# Patient Record
Sex: Male | Born: 1985 | Race: White | Hispanic: No | Marital: Single | Smoking: Current every day smoker
Health system: Southern US, Community
[De-identification: ages and names within clinical notes are randomized; demographics above are authoritative.]

## PROBLEM LIST (undated history)

## (undated) DIAGNOSIS — F1020 Alcohol dependence, uncomplicated: Secondary | ICD-10-CM

## (undated) DIAGNOSIS — E119 Type 2 diabetes mellitus without complications: Secondary | ICD-10-CM

## (undated) DIAGNOSIS — E079 Disorder of thyroid, unspecified: Secondary | ICD-10-CM

## (undated) DIAGNOSIS — E0500 Thyrotoxicosis with diffuse goiter without thyrotoxic crisis or storm: Secondary | ICD-10-CM

## (undated) HISTORY — PX: OTHER SURGICAL HISTORY: SHX169

---

## 2016-02-18 ENCOUNTER — Emergency Department
Admission: EM | Admit: 2016-02-18 | Discharge: 2016-02-18 | Disposition: A | Discharge status: Home / Self Care | Payer: Self-pay | Attending: Emergency Medicine | Admitting: Emergency Medicine

## 2016-02-18 DIAGNOSIS — B958 Unspecified staphylococcus as the cause of diseases classified elsewhere: Secondary | ICD-10-CM | POA: Insufficient documentation

## 2016-02-18 DIAGNOSIS — Z760 Encounter for issue of repeat prescription: Secondary | ICD-10-CM | POA: Insufficient documentation

## 2016-02-18 DIAGNOSIS — H1031 Unspecified acute conjunctivitis, right eye: Secondary | ICD-10-CM | POA: Insufficient documentation

## 2016-02-18 DIAGNOSIS — L089 Local infection of the skin and subcutaneous tissue, unspecified: Secondary | ICD-10-CM | POA: Insufficient documentation

## 2016-02-18 MED ORDER — POLYMYXIN B-TRIMETHOPRIM 10000-0.1 UNIT/ML-% OP SOLN: 2.0000 [drp] | Freq: Four times a day (QID) | OPHTHALMIC | Status: DC

## 2016-02-18 MED ORDER — SULFAMETHOXAZOLE-TRIMETHOPRIM 800-160 MG PO TABS: 1.0000 | ORAL_TABLET | Freq: Two times a day (BID) | ORAL | Status: DC

## 2016-02-18 MED ORDER — LEVOTHYROXINE SODIUM 100 MCG PO TABS: 100.0000 ug | ORAL_TABLET | Freq: Every day | ORAL | Status: DC

## 2016-02-18 MED ORDER — MUPIROCIN CALCIUM 2 % EX CREA: TOPICAL_CREAM | CUTANEOUS | Status: DC

## 2016-02-18 NOTE — ED Provider Notes (Signed)
[facility] Emergency Department Provider Note  ____________________________________________  Time seen: Approximately 3:07 PM  I have reviewed the triage vital signs and the nursing notes.   HISTORY  Chief Complaint Rash    HPI Joshua Medina is a 30 y.o. male who presents to emergency department complaining of right eye redness, irritation, purulent drainage as well as a "rash" to the left chest wall, left medial arm, and right buttocks. Patient reports that I complaint began 2 days ago. He reports conjunctival erythema, purulent drainage, and a scratchy feeling. Patient has not had any medications for this complaint prior to arrival. He denies any visual changes or known contact with persons with pinkeye. Patient also reports having a "rash" 2 weeks to the left lateral chest wall and left medial arm. Patient also reports that this rash has now spread to his right buttocks. He reports that the rash is painful and "raw." Patient tried some topical medication over-the-counter for ringworm with no relief of symptoms. No fevers or chills, headache,visual changes, chest pain, shortness breath, abdominal pain, nausea or vomiting.  Patient is also requesting a refill of his Synthroid. Patient states that he recently lost insurance coverage and is unable to follow up with his primary care for refill of medication at this time. He is requesting refill of medication until he can establish with another clinic.   No past medical history on file.  There are no active problems to display for this patient.   No past surgical history on file.  Current Outpatient Rx  Name  Route  Sig  Dispense  Refill  . levothyroxine (SYNTHROID) 100 MCG tablet   Oral   Take 1 tablet (100 mcg total) by mouth daily before breakfast.   30 tablet   1   . mupirocin cream (BACTROBAN) 2 %      Apply to affected area 3 times daily   30 g   0   . sulfamethoxazole-trimethoprim (BACTRIM  DS,SEPTRA DS) 800-160 MG tablet   Oral   Take 1 tablet by mouth 2 (two) times daily.   14 tablet   0   . trimethoprim-polymyxin b (POLYTRIM) ophthalmic solution   Right Eye   Place 2 drops into the right eye every 6 (six) hours.   10 mL   0     Allergies Review of patient's allergies indicates no known allergies.  No family history on file.  Social History Social History  Substance Use Topics  . Smoking status: Not on file  . Smokeless tobacco: Not on file  . Alcohol Use: Not on file     Review of Systems  Constitutional: No fever/chills Eyes: No visual changes. Positive congestion for conjunctival erythema. Positive for purulent drainage. ENT: No upper respiratory complaints. Cardiovascular: no chest pain. Respiratory: no cough. No SOB. Gastrointestinal: No abdominal pain.  No nausea, no vomiting.  No diarrhea.  No constipation. Musculoskeletal: Negative for musculoskeletal pain. Skin: Negative for rash, abrasions, lacerations, ecchymosis. Neurological: Negative for headaches, focal weakness or numbness. 10-point ROS otherwise negative.  ____________________________________________   PHYSICAL EXAM:  VITAL SIGNS: ED Triage Vitals  Enc Vitals Group     BP 02/18/16 1405 139/81 mmHg     Pulse Rate 02/18/16 1405 75     Resp 02/18/16 1405 16     Temp 02/18/16 1405 98.2 F (36.8 C)     Temp Source 02/18/16 1405 Oral     SpO2 02/18/16 1405 99 %     Weight 02/18/16 1405  165 lb (74.844 kg)     Height 02/18/16 1405  (1.778 m)     Head Cir --      Peak Flow --      Pain Score --      Pain Loc --      Pain Edu? --      Excl. in GC? --      Constitutional: Alert and oriented. Well appearing and in no acute distress. Eyes: Right-sided conjunctivitis erythematous. Purulent drainage noted to upper and lower eyelashes. Funduscopic exam is reassuring.Marland Kitchen PERRL. EOMI. Head: Atraumatic. ENT:      Ears:       Nose: No congestion/rhinnorhea.      Mouth/Throat:  Mucous membranes are moist.  Neck: No stridor.   Hematological/Lymphatic/Immunilogical: No cervical lymphadenopathy. Cardiovascular: Normal rate, regular rhythm. Normal S1 and S2.  Good peripheral circulation. Respiratory: Normal respiratory effort without tachypnea or retractions. Lungs CTAB. Good air entry to the bases with no decreased or absent breath sounds. Musculoskeletal: Full range of motion to all extremities. No gross deformities appreciated. Neurologic:  Normal speech and language. No gross focal neurologic deficits are appreciated.  Skin:  Skin is warm, dry and intact. Erythematous skin lesions noted to the lateral chest wall, medial left upper arm, and right buttocks. This is tender to palpation. Area has an abrasive appearance. No drainage. No fluctuance. Lesion on lateral chest wall is largest in nature and is approximately 5 cm in length and 2 cm in width. Psychiatric: Mood and affect are normal. Speech and behavior are normal. Patient exhibits appropriate insight and judgement.   ____________________________________________   LABS (all labs ordered are listed, but only abnormal results are displayed)  Labs Reviewed - No data to display ____________________________________________  EKG   ____________________________________________  RADIOLOGY   No results found.  ____________________________________________    PROCEDURES  Procedure(s) performed:       Medications - No data to display   ____________________________________________   INITIAL IMPRESSION / ASSESSMENT AND PLAN / ED COURSE  Pertinent labs & imaging results that were available during my care of the patient were reviewed by me and considered in my medical decision making (see chart for details).  Patient's diagnosis is consistent with mitral conjunctivitis, staph skin infection, and medication refill. Exam is reassuring with no indication for imaging or labs. Patient will be discharged  home with prescriptions for antibiotic eyedrops, oral antibiotics, topical antibiotic, and refill of his symptoms with. Patient is to follow up with primary care provider as needed or otherwise directed. Patient is given ED precautions to return to the ED for any worsening or new symptoms.     ____________________________________________  FINAL CLINICAL IMPRESSION(S) / ED DIAGNOSES  Final diagnoses:  Acute bacterial conjunctivitis of right eye  Staph skin infection  Medication refill      NEW MEDICATIONS STARTED DURING THIS VISIT:  Discharge Medication List as of 02/18/2016  3:10 PM    START taking these medications   Details  levothyroxine (SYNTHROID) 100 MCG tablet Take 1 tablet (100 mcg total) by mouth daily before breakfast., Starting 02/18/2016, Until Discontinued, Print    mupirocin cream (BACTROBAN) 2 % Apply to affected area 3 times daily, Print    sulfamethoxazole-trimethoprim (BACTRIM DS,SEPTRA DS) 800-160 MG tablet Take 1 tablet by mouth 2 (two) times daily., Starting 02/18/2016, Until Discontinued, Print    trimethoprim-polymyxin b (POLYTRIM) ophthalmic solution Place 2 drops into the right eye every 6 (six) hours., Starting 02/18/2016, Until Discontinued, Print  This chart was dictated using voice recognition software/Dragon. Despite best efforts to proofread, errors can occur which can change the meaning. Any change was purely unintentional.    Racheal PatchesJonathan D Staphanie Harbison, PA-C 02/18/16 1530  Phineas SemenGraydon Goodman, MD 02/18/16 [phone]

## 2016-02-18 NOTE — Discharge Instructions (Signed)
Bacterial Conjunctivitis Bacterial conjunctivitis, commonly called pink eye, is an inflammation of the clear membrane that covers the white part of the eye (conjunctiva). The inflammation can also happen on the underside of the eyelids. The blood vessels in the conjunctiva become inflamed, causing the eye to become red or pink. Bacterial conjunctivitis may spread easily from one eye to another and from person to person (contagious).  CAUSES  Bacterial conjunctivitis is caused by bacteria. The bacteria may come from your own skin, your upper respiratory tract, or from someone else with bacterial conjunctivitis. SYMPTOMS  The normally white color of the eye or the underside of the eyelid is usually pink or red. The pink eye is usually associated with irritation, tearing, and some sensitivity to light. Bacterial conjunctivitis is often associated with a thick, yellowish discharge from the eye. The discharge may turn into a crust on the eyelids overnight, which causes your eyelids to stick together. If a discharge is present, there may also be some blurred vision in the affected eye. DIAGNOSIS  Bacterial conjunctivitis is diagnosed by your caregiver through an eye exam and the symptoms that you report. Your caregiver looks for changes in the surface tissues of your eyes, which may point to the specific type of conjunctivitis. A sample of any discharge may be collected on a cotton-tip swab if you have a severe case of conjunctivitis, if your cornea is affected, or if you keep getting repeat infections that do not respond to treatment. The sample will be sent to a lab to see if the inflammation is caused by a bacterial infection and to see if the infection will respond to antibiotic medicines. TREATMENT   Bacterial conjunctivitis is treated with antibiotics. Antibiotic eyedrops are most often used. However, antibiotic ointments are also available. Antibiotics pills are sometimes used. Artificial tears or eye  washes may ease discomfort. HOME CARE INSTRUCTIONS   To ease discomfort, apply a cool, clean washcloth to your eye for 10-20 minutes, 3-4 times a day.  Gently wipe away any drainage from your eye with a warm, wet washcloth or a cotton ball.  Wash your hands often with soap and water. Use paper towels to dry your hands.  Do not share towels or washcloths. This may spread the infection.  Change or wash your pillowcase every day.  You should not use eye makeup until the infection is gone.  Do not operate machinery or drive if your vision is blurred.  Stop using contact lenses. Ask your caregiver how to sterilize or replace your contacts before using them again. This depends on the type of contact lenses that you use.  When applying medicine to the infected eye, do not touch the edge of your eyelid with the eyedrop bottle or ointment tube. SEEK IMMEDIATE MEDICAL CARE IF:   Your infection has not improved within 3 days after beginning treatment.  You had yellow discharge from your eye and it returns.  You have increased eye pain.  Your eye redness is spreading.  Your vision becomes blurred.  You have a fever or persistent symptoms for more than 2-3 days.  You have a fever and your symptoms suddenly get worse.  You have facial pain, redness, or swelling. MAKE SURE YOU:   Understand these instructions.  Will watch your condition.  Will get help right away if you are not doing well or get worse.   This information is not intended to replace advice given to you by your health care provider. Make sure you   discuss any questions you have with your health care provider.   Document Released: 08/07/2005 Document Revised: 08/28/2014 Document Reviewed: 01/08/2012 Elsevier Interactive Patient Education 2016 Elsevier Inc.  Staphylococcal Infection WHAT IS A STAPHYLOCOCCAL INFECTION? Staphylococcus aureus, also known as staph, is a bacteria that can cause infections. The most common  type of staph infection is a skin infection. Staph can spread easily from one person to another. HOW IS A STAPHYLOCOCCAL INFECTION DIAGNOSED? Your health care provider may diagnose and treat a staph infection based on an exam. He or she may also do a culture from the affected area:  To find out exactly what type of bacteria is causing your infection.  To decide which medicine is best to treat it. HOW IS A STAPHYLOCOCCAL INFECTION TREATED? Most staph infections can be easily treated with antibiotic medicine. Some infections must be drained. More serious types of staph, including methicillin-resistant Staphylococcus aureus (MRSA) may be more difficult to treat. They may require a strong antibiotic or more than one antibiotic. WHAT SHOULD I DO AT HOME?  Take your antibiotic as directed by your health care provider. Finish your antibiotic even if you start to feel better.  Keep all follow-up visits as directed by your health care provider.  Tell all of your health care providers including dentists that you have a staph infection, especially if you have been diagnosed with MRSA.  Ask your health care provider when it is safe for you to return to school or work.  To prevent spreading the infection:  Keep infections and pus or drainage material covered with clean, dry bandages (dressings), or as directed by your health care provider.  Wash your hands with soap and water often, especially after changing dressings or touching your infection.  Advise your family and other close contacts to wash their hands frequently with soap and water. They should always wash their hands after changing your dressings, touching the infected area, or touching infected materials.  Avoid sharing personal items that may have had contact with your infection. These include towels, washcloths, razors, clothing, and uniforms.  Wash your linens and clothes with hot water and laundry detergent. Drying clothes in a hot  dryer--rather than air-drying--also helps to kill bacteria in clothes. WHEN SHOULD I CONTACT MY HEALTH CARE PROVIDER? Although they are usually easy to treat, some staph infections can become very serious. You should contact your health care provider if your infection gets worse or if you have a fever.   This information is not intended to replace advice given to you by your health care provider. Make sure you discuss any questions you have with your health care provider.   Document Released: 08/28/2014 Document Reviewed: 08/28/2014 Elsevier Interactive Patient Education 2016 Elsevier Inc.  Antibiotic Medicine Antibiotic medicines are used to treat infections caused by bacteria. They work by injuring or killing the bacteria that is making you sick. HOW IS AN ANTIBIOTIC CHOSEN? An antibiotic is chosen based on many factors. To help your health care provider choose one for you, tell your health care provider if:  You have any allergies.  You are pregnant or plan to get pregnant.  You are breastfeeding.  You are taking any medicines. These include over-the-counter medicines, prescription medicines, and herbal remedies.  You have a medical condition or problem you have not already discussed. Your health care provider will also consider:  How often the medicine has to be taken.  Common side effects of the medicine.  The cost of the medicine.  The taste of the medicine. If you have questions about why an antibiotic was chosen, make sure to ask. FOR HOW LONG SHOULD I TAKE MY ANTIBIOTIC? Continue to take your antibiotic for as long as told by your health care provider. Do not stop taking it when you feel better. If you stop taking it too soon:  You may start to feel sick again.  Your infection may become harder to treat.  Complications may develop. WHAT IF I MISS A DOSE? Try not to miss any doses of medicine. If you miss a dose, take it as soon as possible. However, if it is almost  time for the next dose:  If you are taking 2 doses per day, take the missed dose and the next dose 5 to 6 hours apart.  If you are taking 3 or more doses per day, take the missed dose and the next dose 2 to 4 hours apart, then go back to the normal schedule. If you cannot make up a missed dose, take the next scheduled dose on time. Then take the missed dose after you have taken all the doses as recommended by your health care provider, as if you had one more dose left. DO ANTIBIOTICS AFFECT BIRTH CONTROL? Birth control pills may not work while you are on antibiotics. If you are taking birth control pills, continue taking them as usual and use a second form of birth control, such as a condom, to avoid unwanted pregnancy. Continue using the second form of birth control until you are finished with your current 1 month cycle of birth control pills. OTHER INFORMATION  If there is any medicine left over, throw it away.  Never take someone else's antibiotics.  Never take leftover antibiotics. SEEK MEDICAL CARE IF:  You get worse.  You do not feel better within a few days of starting the antibiotic medicine.  You vomit.  White patches appear in your mouth.  You have new joint pain that begins after starting the antibiotic.  You have new muscle aches that begin after starting the antibiotic.  You had a fever before starting the antibiotic and it returns.  You have any symptoms of an allergic reaction, such as an itchy rash. If this happens, stop taking the antibiotic. SEEK IMMEDIATE MEDICAL CARE IF:  Your urine turns dark or becomes blood-colored.  Your skin turns yellow.  You bruise or bleed easily.  You have severe diarrhea and abdominal cramps.  You have a severe headache.  You have signs of a severe allergic reaction, such as:  Trouble breathing.  Wheezing.  Swelling of the lips, tongue, or face.  Fainting.  Blisters on the skin or in the mouth. If you have signs of  a severe allergic reaction, stop taking the antibiotic right away.   This information is not intended to replace advice given to you by your health care provider. Make sure you discuss any questions you have with your health care provider.   Document Released: 04/19/2004 Document Revised: 04/28/2015 Document Reviewed: 12/23/2014 Elsevier Interactive Patient Education [employer]2016 Elsevier Inc.

## 2016-02-18 NOTE — ED Notes (Signed)
Pt reports skin eruption on his left upper back, small place on the left upper arm, outer and inner, pt also has redness with drainage to the rt eye

## 2016-02-18 NOTE — ED Notes (Signed)
See triage note  Developed a red raised area under left arm about 2 weeks ago   Now has an area to buttocks and left upper arm

## 2019-04-08 ENCOUNTER — Encounter: Payer: Self-pay | Admitting: Medical Oncology

## 2019-04-08 ENCOUNTER — Emergency Department
Admission: EM | Admit: 2019-04-08 | Discharge: 2019-04-08 | Disposition: A | Discharge status: Home / Self Care | Payer: Self-pay | Attending: Emergency Medicine | Admitting: Emergency Medicine

## 2019-04-08 ENCOUNTER — Other Ambulatory Visit: Payer: Self-pay

## 2019-04-08 DIAGNOSIS — R112 Nausea with vomiting, unspecified: Secondary | ICD-10-CM | POA: Insufficient documentation

## 2019-04-08 HISTORY — DX: Thyrotoxicosis with diffuse goiter without thyrotoxic crisis or storm: E05.00

## 2019-04-08 HISTORY — DX: Disorder of thyroid, unspecified: E07.9

## 2019-04-08 NOTE — ED Triage Notes (Signed)
Pt reports that he began having nausea and vomiting last night- denies pain. Pt reports he thinks he ate some "bad shushi". Pt reports only reason he is here for doctors note from work.

## 2019-04-08 NOTE — ED Notes (Signed)
See triage note  Presents with n/v which started last pm  Last time vomiting was at 6 am  Denies any abd pain or fever thinks he ate some bad sushi  Afebrile on arrival

## 2019-04-08 NOTE — ED Provider Notes (Signed)
[facility] Emergency Department Provider Note  ____________________________________________   First MD Initiated Contact with Patient 04/08/19 1327     (approximate)  I have reviewed the triage vital signs and the nursing notes.   HISTORY  Chief Complaint Emesis    HPI Townsend RogerScott Chock is a 33 y.o. male presents to the ED complaining of nausea vomiting that he had last night.  Thinks he ate some "bad sushi ".  He denies any diarrhea.  He only had 3-4 episodes of vomiting and says none today.  States he needs a work note.  He is also concerned about his Graves' disease he is been on medication for 5 to 6 months.  He does not have a regular doctor at this time.    Past Medical History:  Diagnosis Date  . Graves disease   . Thyroid disease     There are no active problems to display for this patient.     Prior to Admission medications   Not on File    Allergies Patient has no known allergies.  No family history on file.  Social History Social History   Tobacco Use  . Smoking status: Not on file  Substance Use Topics  . Alcohol use: Not on file  . Drug use: Not on file    Review of Systems  Constitutional: No fever/chills Eyes: No visual changes. ENT: No sore throat. Respiratory: Denies cough Gastrointestinal: Positive for resolved vomiting Genitourinary: Negative for dysuria. Musculoskeletal: Negative for back pain. Skin: Negative for rash.    ____________________________________________   PHYSICAL EXAM:  VITAL SIGNS: ED Triage Vitals  Enc Vitals Group     BP 04/08/19 1318 116/84     Pulse Rate 04/08/19 1318 82     Resp 04/08/19 1318 16     Temp 04/08/19 1318 98.9 F (37.2 C)     Temp Source 04/08/19 1318 Oral     SpO2 04/08/19 1318 100 %     Weight 04/08/19 1319 148 lb (67.1 kg)     Height 04/08/19 1319 5\' 10"  (1.778 m)     Head Circumference --      Peak Flow --      Pain Score 04/08/19 1319 0     Pain Loc --       Pain Edu? --      Excl. in GC? --     Constitutional: Alert and oriented. Well appearing and in no acute distress. Eyes: Conjunctivae are normal.  Head: Atraumatic. Nose: No congestion/rhinnorhea. Mouth/Throat: Mucous membranes are moist.   Neck:  supple no lymphadenopathy noted Cardiovascular: Normal rate, regular rhythm. Heart sounds are normal Respiratory: Normal respiratory effort.  No retractions, lungs c t a  Abd: soft nontender bs normal all 4 quad, GU: deferred Musculoskeletal: FROM all extremities, warm and well perfused Neurologic:  Normal speech and language.  Skin:  Skin is warm, dry and intact. No rash noted. Psychiatric: Mood and affect are normal. Speech and behavior are normal.  ____________________________________________   LABS (all labs ordered are listed, but only abnormal results are displayed)  Labs Reviewed - No data to display ____________________________________________   ____________________________________________  RADIOLOGY    ____________________________________________   PROCEDURES  Procedure(s) performed: No  Procedures    ____________________________________________   INITIAL IMPRESSION / ASSESSMENT AND PLAN / ED COURSE  Pertinent labs & imaging results that were available during my care of the patient were reviewed by me and considered in my medical decision making (see chart  for details).   Patient is 33 year old male presents emergency department requesting a work note as he had nausea and vomiting last night due to bad sushi.  Physical exam patient appears nontoxic.  He appears well.  Vitals are normal.  Remainder exam is unremarkable  Explained findings to the patient.  Due to his request for thyroid medication explained to him this need to be monitored and he should [facility] with [facility] or 1 of the clinics such as [facility].  Information was given to the patient and a booklet.  He is to  follow-up with 1 of these clinics.  He states he understands will comply.  Is discharged stable condition.    Jermey Closs was evaluated in Emergency Department on 04/08/2019 for the symptoms described in the history of present illness. He was evaluated in the context of the global COVID-19 pandemic, which necessitated consideration that the patient might be at risk for infection with the SARS-CoV-2 virus that causes COVID-19. Institutional protocols and algorithms that pertain to the evaluation of patients at risk for COVID-19 are in a state of rapid change based on information released by regulatory bodies including the CDC and federal and state organizations. These policies and algorithms were followed during the patient's care in the ED.   As part of my medical decision making, I reviewed the following data within the [place] notes reviewed and incorporated, Old chart reviewed, Notes from prior ED visits and [place] Controlled Substance Database  ____________________________________________   FINAL CLINICAL IMPRESSION(S) / ED DIAGNOSES  Final diagnoses:  Nausea and vomiting in adult patient      NEW MEDICATIONS STARTED DURING THIS VISIT:  New Prescriptions   No medications on file     Note:  This document was prepared using Dragon voice recognition software and may include unintentional dictation errors.    Versie Starks, PA-C 04/08/19 1449    Carrie Mew, MD 04/08/19 2031

## 2019-04-08 NOTE — Discharge Instructions (Signed)
Drink plenty fluids.  Follow-up with either the [facility] or [facility].  Please call for an appointment.

## 2019-06-20 ENCOUNTER — Encounter: Payer: Self-pay | Admitting: Emergency Medicine

## 2019-06-20 ENCOUNTER — Other Ambulatory Visit: Payer: Self-pay

## 2019-06-20 ENCOUNTER — Emergency Department
Admission: EM | Admit: 2019-06-20 | Discharge: 2019-06-20 | Disposition: A | Discharge status: Home / Self Care | Payer: Self-pay | Attending: Emergency Medicine | Admitting: Emergency Medicine

## 2019-06-20 DIAGNOSIS — F17210 Nicotine dependence, cigarettes, uncomplicated: Secondary | ICD-10-CM | POA: Insufficient documentation

## 2019-06-20 DIAGNOSIS — E039 Hypothyroidism, unspecified: Secondary | ICD-10-CM | POA: Insufficient documentation

## 2019-06-20 DIAGNOSIS — F1020 Alcohol dependence, uncomplicated: Secondary | ICD-10-CM

## 2019-06-20 LAB — CBC WITH DIFFERENTIAL/PLATELET
Abs Immature Granulocytes: 0.09 10*3/uL — ABNORMAL HIGH (ref 0.00–0.07)
Basophils Absolute: 0.2 10*3/uL — ABNORMAL HIGH (ref 0.0–0.1)
Basophils Relative: 3 %
Eosinophils Absolute: 0.1 10*3/uL (ref 0.0–0.5)
Eosinophils Relative: 2 %
HCT: 43.7 % (ref 39.0–52.0)
Hemoglobin: 15.1 g/dL (ref 13.0–17.0)
Immature Granulocytes: 1 %
Lymphocytes Relative: 51 %
Lymphs Abs: 3.3 10*3/uL (ref 0.7–4.0)
MCH: 36.1 pg — ABNORMAL HIGH (ref 26.0–34.0)
MCHC: 34.6 g/dL (ref 30.0–36.0)
MCV: 104.5 fL — ABNORMAL HIGH (ref 80.0–100.0)
Monocytes Absolute: 0.5 10*3/uL (ref 0.1–1.0)
Monocytes Relative: 7 %
Neutro Abs: 2.4 10*3/uL (ref 1.7–7.7)
Neutrophils Relative %: 36 %
Platelets: 110 10*3/uL — ABNORMAL LOW (ref 150–400)
RBC: 4.18 MIL/uL — ABNORMAL LOW (ref 4.22–5.81)
RDW: 14.8 % (ref 11.5–15.5)
WBC: 6.7 10*3/uL (ref 4.0–10.5)
nRBC: 0 % (ref 0.0–0.2)

## 2019-06-20 LAB — TROPONIN I (HIGH SENSITIVITY): Troponin I (High Sensitivity): 6 ng/L (ref <18)

## 2019-06-20 LAB — COMPREHENSIVE METABOLIC PANEL
ALT: 78 U/L — ABNORMAL HIGH (ref 0–44)
AST: 323 U/L — ABNORMAL HIGH (ref 15–41)
Albumin: 5 g/dL (ref 3.5–5.0)
Alkaline Phosphatase: 124 U/L (ref 38–126)
Anion gap: 18 — ABNORMAL HIGH (ref 5–15)
BUN: 11 mg/dL (ref 6–20)
CO2: 24 mmol/L (ref 22–32)
Calcium: 9.6 mg/dL (ref 8.9–10.3)
Chloride: 103 mmol/L (ref 98–111)
Creatinine, Ser: 1.04 mg/dL (ref 0.61–1.24)
GFR calc Af Amer: >60 mL/min (ref >60)
GFR calc non Af Amer: >60 mL/min (ref >60)
Glucose, Bld: 97 mg/dL (ref 70–99)
Potassium: 5.5 mmol/L — ABNORMAL HIGH (ref 3.5–5.1)
Sodium: 145 mmol/L (ref 135–145)
Total Bilirubin: 0.9 mg/dL (ref 0.3–1.2)
Total Protein: 8.4 g/dL — ABNORMAL HIGH (ref 6.5–8.1)

## 2019-06-20 LAB — TSH: TSH: 92 u[IU]/mL — ABNORMAL HIGH (ref 0.350–4.500)

## 2019-06-20 LAB — T4, FREE: Free T4: <0.25 ng/dL — ABNORMAL LOW (ref 0.61–1.12)

## 2019-06-20 MED ORDER — LEVOTHYROXINE SODIUM 100 MCG PO TABS: 100.0000 ug | ORAL_TABLET | Freq: Every day | ORAL | 11 refills | Status: DC

## 2019-06-20 MED ORDER — CHLORDIAZEPOXIDE HCL 25 MG PO CAPS: 25.0000 mg | ORAL_CAPSULE | Freq: Four times a day (QID) | ORAL | 0 refills | Status: DC | PRN

## 2019-06-20 MED ORDER — LEVOTHYROXINE SODIUM 50 MCG PO TABS
100.0000 ug | ORAL_TABLET | Freq: Every day | ORAL | Status: DC
  Administered 2019-06-20: 100 ug via ORAL
  Filled 2019-06-20: qty 2

## 2019-06-20 NOTE — ED Provider Notes (Addendum)
[facility] Emergency Department Provider Note       Time seen: ----------------------------------------- 10:01 AM on 06/20/2019 -----------------------------------------   I have reviewed the triage vital signs and the nursing notes.  HISTORY   Chief Complaint Weakness    HPI Joshua Medina is a 33 y.o. male with a history of Graves' disease who presents to the ED for a month of weakness.  Patient reports he has been off his thyroid medications for the last 1 to 2 years.  Patient states he has not taken his thyroid medication because he has run out of his insurance.  He has felt weak, has been had lots of bruising.  He denies fevers, chills or other complaints.  Past Medical History:  Diagnosis Date  . Graves disease   . Thyroid disease     There are no active problems to display for this patient.   History reviewed. No pertinent surgical history.  Allergies Patient has no known allergies.  Social History Social History   Tobacco Use  . Smoking status: Current Every Day Smoker  . Smokeless tobacco: Never Used  Substance Use Topics  . Alcohol use: Not on file  . Drug use: Not on file    Review of Systems Constitutional: Negative for fever. Cardiovascular: Negative for chest pain. Respiratory: Negative for shortness of breath. Gastrointestinal: Negative for abdominal pain, vomiting and diarrhea. Musculoskeletal: Negative for back pain. Skin: Positive for bruising Neurological: Positive for generalized weakness  All systems negative/normal/unremarkable except as stated in the HPI  ____________________________________________   PHYSICAL EXAM:  VITAL SIGNS: ED Triage Vitals  Enc Vitals Group     BP 06/20/19 0706 118/84     Pulse Rate 06/20/19 0706 (!) 102     Resp 06/20/19 0706 16     Temp 06/20/19 0706 97.6 F (36.4 C)     Temp Source 06/20/19 0706 Oral     SpO2 06/20/19 0706 97 %     Weight 06/20/19 0659 150 lb (68 kg)      Height 06/20/19 0659 5\' 10"  (1.778 m)     Head Circumference --      Peak Flow --      Pain Score 06/20/19 0659 0     Pain Loc --      Pain Edu? --      Excl. in [place]? --    Constitutional: Alert and oriented. Well appearing and in no distress. Eyes: Conjunctivae are normal. Normal extraocular movements. Cardiovascular: Normal rate, regular rhythm. No murmurs, rubs, or gallops. Respiratory: Normal respiratory effort without tachypnea nor retractions. Breath sounds are clear and equal bilaterally. No wheezes/rales/rhonchi. Gastrointestinal: Soft and nontender. Normal bowel sounds Musculoskeletal: Nontender with normal range of motion in extremities. No lower extremity tenderness nor edema. Neurologic:  Normal speech and language. No gross focal neurologic deficits are appreciated.  Skin: Scattered bruising is noted Psychiatric: Mood and affect are normal. Speech and behavior are normal.  ____________________________________________  EKG: Interpreted by me.  Sinus rhythm the rate of 88 bpm, rightward axis, normal QT  ____________________________________________  ED COURSE:  As part of my medical decision making, I reviewed the following data within the [place] History obtained from family if available, nursing notes, old chart and ekg, as well as notes from prior ED visits. Patient presented for weakness with known hypothyroidism, we will assess with labs and imaging as indicated at this time.   Procedures  Joshua Medina was evaluated in Emergency Department on 06/20/2019 for  the symptoms described in the history of present illness. He was evaluated in the context of the global COVID-19 pandemic, which necessitated consideration that the patient might be at risk for infection with the SARS-CoV-2 virus that causes COVID-19. Institutional protocols and algorithms that pertain to the evaluation of patients at risk for COVID-19 are in a state of rapid change based on  information released by regulatory bodies including the CDC and federal and state organizations. These policies and algorithms were followed during the patient's care in the ED.  ____________________________________________   LABS (pertinent positives/negatives)  Labs Reviewed  CBC WITH DIFFERENTIAL/PLATELET - Abnormal; Notable for the following components:      Result Value   RBC 4.18 (*)    MCV 104.5 (*)    MCH 36.1 (*)    Platelets 110 (*)    Basophils Absolute 0.2 (*)    Abs Immature Granulocytes 0.09 (*)    All other components within normal limits  COMPREHENSIVE METABOLIC PANEL - Abnormal; Notable for the following components:   Potassium 5.5 (*)    Total Protein 8.4 (*)    AST 323 (*)    ALT 78 (*)    Anion gap 18 (*)    All other components within normal limits  TSH - Abnormal; Notable for the following components:   TSH 92.000 (*)    All other components within normal limits  URINALYSIS, COMPLETE (UACMP) WITH MICROSCOPIC  T4, FREE  TROPONIN I (HIGH SENSITIVITY)   ____________________________________________   DIFFERENTIAL DIAGNOSIS   Hypothyroidism, dehydration, electrolyte abnormality, occult infection  FINAL ASSESSMENT AND PLAN  Hypothyroidism, alcohol use disorder   Plan: The patient had presented for weakness. Patient's labs did indicate significant hypothyroidism.  EKG revealed normal QT.  We restarted his Synthroid here with prescriptions for same at a lower dose than his prior. Patient also suffers from alcoholism, and I have given him some detox referral information. He will be encouraged to have close outpatient follow-up.   Ulice Dash, MD    Note: This note was generated in part or whole with voice recognition software. Voice recognition is usually quite accurate but there are transcription errors that can and very often do occur. I apologize for any typographical errors that were not detected and corrected.     Emily Filbert,  MD 06/20/19 1015    Emily Filbert, MD 06/20/19 [phone]

## 2019-06-20 NOTE — ED Notes (Signed)
Pt ambulatory to lobby in NAD. PApers in hands and pt updated on treatment plan.

## 2019-06-20 NOTE — ED Triage Notes (Signed)
Patient ambulatory to triage with steady gait, without difficulty or distress noted, mask in place; pt reports for last month having weakness ; st has been off his thyroid meds last 1-2 yrs

## 2020-01-29 ENCOUNTER — Encounter: Payer: Self-pay | Admitting: Emergency Medicine

## 2020-01-29 ENCOUNTER — Other Ambulatory Visit: Payer: Self-pay

## 2020-01-29 ENCOUNTER — Emergency Department: Payer: Self-pay

## 2020-01-29 ENCOUNTER — Emergency Department
Admission: EM | Admit: 2020-01-29 | Discharge: 2020-01-30 | Disposition: A | Discharge status: Home / Self Care | Payer: Self-pay | Attending: Emergency Medicine | Admitting: Emergency Medicine

## 2020-01-29 DIAGNOSIS — F10129 Alcohol abuse with intoxication, unspecified: Secondary | ICD-10-CM | POA: Insufficient documentation

## 2020-01-29 DIAGNOSIS — R55 Syncope and collapse: Secondary | ICD-10-CM | POA: Insufficient documentation

## 2020-01-29 DIAGNOSIS — W01198A Fall on same level from slipping, tripping and stumbling with subsequent striking against other object, initial encounter: Secondary | ICD-10-CM | POA: Insufficient documentation

## 2020-01-29 DIAGNOSIS — Y900 Blood alcohol level of less than 20 mg/100 ml: Secondary | ICD-10-CM | POA: Insufficient documentation

## 2020-01-29 DIAGNOSIS — Y999 Unspecified external cause status: Secondary | ICD-10-CM | POA: Insufficient documentation

## 2020-01-29 DIAGNOSIS — E0500 Thyrotoxicosis with diffuse goiter without thyrotoxic crisis or storm: Secondary | ICD-10-CM | POA: Insufficient documentation

## 2020-01-29 DIAGNOSIS — Y9389 Activity, other specified: Secondary | ICD-10-CM | POA: Insufficient documentation

## 2020-01-29 DIAGNOSIS — S0181XA Laceration without foreign body of other part of head, initial encounter: Secondary | ICD-10-CM

## 2020-01-29 DIAGNOSIS — F10929 Alcohol use, unspecified with intoxication, unspecified: Secondary | ICD-10-CM

## 2020-01-29 DIAGNOSIS — E876 Hypokalemia: Secondary | ICD-10-CM | POA: Insufficient documentation

## 2020-01-29 DIAGNOSIS — Y92012 Bathroom of single-family (private) house as the place of occurrence of the external cause: Secondary | ICD-10-CM | POA: Insufficient documentation

## 2020-01-29 DIAGNOSIS — S01112A Laceration without foreign body of left eyelid and periocular area, initial encounter: Secondary | ICD-10-CM | POA: Insufficient documentation

## 2020-01-29 LAB — BASIC METABOLIC PANEL
Anion gap: 11 (ref 5–15)
BUN: <5 mg/dL — ABNORMAL LOW (ref 6–20)
CO2: 23 mmol/L (ref 22–32)
Calcium: 7 mg/dL — ABNORMAL LOW (ref 8.9–10.3)
Chloride: 104 mmol/L (ref 98–111)
Creatinine, Ser: 0.92 mg/dL (ref 0.61–1.24)
GFR calc Af Amer: >60 mL/min (ref >60)
GFR calc non Af Amer: >60 mL/min (ref >60)
Glucose, Bld: 99 mg/dL (ref 70–99)
Potassium: 2.8 mmol/L — ABNORMAL LOW (ref 3.5–5.1)
Sodium: 138 mmol/L (ref 135–145)

## 2020-01-29 LAB — GLUCOSE, CAPILLARY: Glucose-Capillary: 86 mg/dL (ref 70–99)

## 2020-01-29 LAB — CBC
HCT: 31.3 % — ABNORMAL LOW (ref 39.0–52.0)
Hemoglobin: 11.1 g/dL — ABNORMAL LOW (ref 13.0–17.0)
MCH: 35.9 pg — ABNORMAL HIGH (ref 26.0–34.0)
MCHC: 35.5 g/dL (ref 30.0–36.0)
MCV: 101.3 fL — ABNORMAL HIGH (ref 80.0–100.0)
Platelets: 92 10*3/uL — ABNORMAL LOW (ref 150–400)
RBC: 3.09 MIL/uL — ABNORMAL LOW (ref 4.22–5.81)
RDW: 14.8 % (ref 11.5–15.5)
WBC: 6.1 10*3/uL (ref 4.0–10.5)
nRBC: 0 % (ref 0.0–0.2)

## 2020-01-29 LAB — T4, FREE: Free T4: 0.33 ng/dL — ABNORMAL LOW (ref 0.61–1.12)

## 2020-01-29 LAB — TSH: TSH: 40.132 u[IU]/mL — ABNORMAL HIGH (ref 0.350–4.500)

## 2020-01-29 LAB — ETHANOL: Alcohol, Ethyl (B): 323 mg/dL (ref <10)

## 2020-01-29 MED ORDER — SODIUM CHLORIDE 0.9 % IV SOLN: Freq: Once | INTRAVENOUS | Status: AC

## 2020-01-29 MED ORDER — SODIUM CHLORIDE 0.9% FLUSH
3.0000 mL | Freq: Once | INTRAVENOUS | Status: AC
  Administered 2020-01-29: 3 mL via INTRAVENOUS

## 2020-01-29 NOTE — ED Provider Notes (Signed)
-----------------------------------------   11:06 PM on 01/29/2020 -----------------------------------------  Blood pressure 115/90, pulse 64, temperature 97.6 F (36.4 C), temperature source Oral, resp. rate 18, height 5\' 6"  (1.676 m), weight 68 kg, SpO2 100 %.  Assuming care from Dr. .  In short, Joshua Medina is a 34 y.o. male with a chief complaint of Near Syncope .  Refer to the original H&P for additional details.  The current plan of care is to follow-up labs and observe until clinically sober.  ----------------------------------------- 3:31 AM on 01/30/2020 -----------------------------------------  Labs remarkable for hypokalemia and hypothyroidism in addition to elevated blood alcohol.  Patient was given 40 mEq of potassium but refused any additional doses.  He was also given initial dose of levothyroxine, which he was previously taking but has been off for at least 1 year.  He was provided with prescription for levothyroxine and counseled to follow-up with his PCP.  His wife also arrived to the ED to provide a safe ride home.  Patient agrees with plan.    03/31/2020, MD 01/30/20 [phone]

## 2020-01-29 NOTE — ED Provider Notes (Signed)
ER Provider Note       Time seen: 9:47 PM    I have reviewed the vital signs and the nursing notes.  HISTORY   Chief Complaint Near Syncope    HPI Joshua Medina is a 34 y.o. male with a history of Graves' disease who presents today for syncope. Patient describes 2 syncopal episodes at home, alcohol reportedly is on board.  Patient fell and hit left side of his head on the toilet.  Did have loss of consciousness at some point, sustained a laceration near the left eye.  Patient reports left-sided jaw swelling from a tooth abscess that is not causing him pain at the moment.  He denies any recent illness, states he supposed to be on thyroid medication but is not taking it.  Reportedly he was orthostatic in route.  Past Medical History:  Diagnosis Date  . Graves disease   . Thyroid disease     History reviewed. No pertinent surgical history.  Allergies Patient has no known allergies.  Review of Systems Constitutional: Negative for fever. Cardiovascular: Negative for chest pain. Respiratory: Negative for shortness of breath. Gastrointestinal: Negative for abdominal pain, vomiting and diarrhea. Musculoskeletal: Negative for back pain. Skin: Positive for laceration Neurological: Negative for headaches, focal weakness or numbness.  All systems negative/normal/unremarkable except as stated in the HPI  ____________________________________________   PHYSICAL EXAM:  VITAL SIGNS: Vitals:   01/29/20 2142  BP: 115/90  Pulse: 64  Resp: 18  SpO2: 100%    Constitutional: Alert and oriented. Well appearing and in no distress. Eyes: Conjunctivae are normal. Normal extraocular movements. ENT      Head: Normocephalic, 2.5 cm linear laceration in the superior lateral periorbital region on the left      Nose: No congestion/rhinnorhea.      Mouth/Throat: Mucous membranes are moist, left-sided cheek and jaw swelling relative to dental abscess on the left lower jaw      Neck: No  stridor. Cardiovascular: Normal rate, regular rhythm. No murmurs, rubs, or gallops. Respiratory: Normal respiratory effort without tachypnea nor retractions. Breath sounds are clear and equal bilaterally. No wheezes/rales/rhonchi. Gastrointestinal: Soft and nontender. Normal bowel sounds Musculoskeletal: Nontender with normal range of motion in extremities. No lower extremity tenderness nor edema. Neurologic:  Normal speech and language. No gross focal neurologic deficits are appreciated.  Skin: Left periorbital laceration as noted above Psychiatric: Speech and behavior are normal.  ____________________________________________  EKG: Interpreted by me.  Sinus rhythm rate of 63 bpm, normal PR interval, normal QRS, normal QT  ____________________________________________   LABS (pertinent positives/negatives)  Labs Reviewed  CBC - Abnormal; Notable for the following components:      Result Value   RBC 3.09 (*)    Hemoglobin 11.1 (*)    HCT 31.3 (*)    MCV 101.3 (*)    MCH 35.9 (*)    Platelets 92 (*)    All other components within normal limits  ETHANOL - Abnormal; Notable for the following components:   Alcohol, Ethyl (B) 323 (*)    All other components within normal limits  GLUCOSE, CAPILLARY  URINALYSIS, COMPLETE (UACMP) WITH MICROSCOPIC  URINE DRUG SCREEN, QUALITATIVE ([facility] ONLY)  BASIC METABOLIC PANEL  T4, FREE  TSH  CBG MONITORING, ED  CBG MONITORING, ED   .Marland KitchenLaceration Repair  Date/Time: 01/29/2020 9:51 PM Performed by: Emily Filbert, MD Authorized by: Emily Filbert, MD   Consent:    Consent obtained:  Verbal   Consent given by:  Patient  Anesthesia (see MAR for exact dosages):    Anesthesia method:  None Laceration details:    Location:  Face   Face location:  L eyebrow   Length (cm):  2.5   Depth (mm):  3 Repair type:    Repair type:  Simple Treatment:    Amount of cleaning:  Standard   Irrigation solution:  Sterile saline Skin repair:     Repair method:  Tissue adhesive Approximation:    Approximation:  Close Post-procedure details:    Dressing:  Open (no dressing)   Patient tolerance of procedure:  Tolerated well, no immediate complications     RADIOLOGY  Images were viewed by me CT head was refused by the patient  DIFFERENTIAL DIAGNOSIS  Syncope, intoxication, dehydration, electrolyte abnormality, arrhythmia, subdural, tooth abscess, laceration  ASSESSMENT AND PLAN  Syncope, facial laceration, alcohol intoxication   Plan: The patient had presented for a syncopal event.  Patient was reportedly significantly orthostatic in the prehospital setting.  He has received IV fluids here.  Wound was repaired as dictated above.  Patient's labs indicated likely chronic alcoholism and severe alcohol intoxication.   Lenise Arena MD    Note: This note was generated in part or whole with voice recognition software. Voice recognition is usually quite accurate but there are transcription errors that can and very often do occur. I apologize for any typographical errors that were not detected and corrected.     Earleen Newport, MD 01/29/20 2249

## 2020-01-29 NOTE — ED Notes (Signed)
This RN attended to pt's call bell.  Pt st "I don't want the CT scan". This RN notified EDP Williams.

## 2020-01-29 NOTE — ED Notes (Signed)
Pt provided with phone to call spouse.

## 2020-01-29 NOTE — ED Triage Notes (Addendum)
Pt from home via [facility]. Per EMS, pt's spouse called [facility]. Pt had 2 syncopal episodes at home, st drinking 'bodka" tonight.  Pt st falling "on the way to the bathroom and hit the toilet". LOC - laceration noted on left upper eyebrow.   EDP Williams at bedside.

## 2020-01-30 MED ORDER — LEVOTHYROXINE SODIUM 100 MCG PO TABS
100.0000 ug | ORAL_TABLET | Freq: Every day | ORAL | 1 refills | Status: DC
Start: 2020-01-30 — End: 2020-02-20

## 2020-01-30 MED ORDER — LEVOTHYROXINE SODIUM 50 MCG PO TABS
100.0000 ug | ORAL_TABLET | Freq: Once | ORAL | Status: AC
  Administered 2020-01-30: 100 ug via ORAL
  Filled 2020-01-30: qty 2

## 2020-01-30 MED ORDER — POTASSIUM CHLORIDE CRYS ER 20 MEQ PO TBCR
40.0000 meq | EXTENDED_RELEASE_TABLET | Freq: Once | ORAL | Status: AC
  Administered 2020-01-30: 40 meq via ORAL
  Filled 2020-01-30: qty 2

## 2020-01-30 MED ORDER — POTASSIUM CHLORIDE CRYS ER 20 MEQ PO TBCR: 40.0000 meq | EXTENDED_RELEASE_TABLET | Freq: Once | ORAL | Status: DC

## 2020-01-30 NOTE — ED Notes (Addendum)
Pt has removed his cardiac monitoring and IV and s requesting to be d/c at this time. MD notified

## 2020-01-30 NOTE — ED Notes (Signed)
Pt received discharge instructions and has no further questions.  Patient refused discharge vitals and signature.  Patient ambulated out to wife for ride with this RN with steady gait.

## 2020-01-30 NOTE — ED Notes (Signed)
Per MD, this RN encouraged the pt to stay and wait for his 2nd dose of potassium. Pt refused to stay or accept 2nd dose of potassium stating, "ill just take some when I get home". MD notified

## 2020-02-15 ENCOUNTER — Other Ambulatory Visit: Payer: Self-pay

## 2020-02-15 ENCOUNTER — Emergency Department: Payer: Self-pay

## 2020-02-15 ENCOUNTER — Inpatient Hospital Stay
Admission: AD | Admit: 2020-02-15 | Discharge: 2020-02-20 | DRG: 897 | Disposition: A | Discharge status: Home Health | Payer: Self-pay | Attending: Internal Medicine | Admitting: Internal Medicine

## 2020-02-15 ENCOUNTER — Encounter: Payer: Self-pay | Admitting: Emergency Medicine

## 2020-02-15 DIAGNOSIS — D689 Coagulation defect, unspecified: Secondary | ICD-10-CM | POA: Diagnosis present

## 2020-02-15 DIAGNOSIS — D6959 Other secondary thrombocytopenia: Secondary | ICD-10-CM | POA: Diagnosis present

## 2020-02-15 DIAGNOSIS — R4182 Altered mental status, unspecified: Secondary | ICD-10-CM

## 2020-02-15 DIAGNOSIS — F10231 Alcohol dependence with withdrawal delirium: Principal | ICD-10-CM | POA: Diagnosis present

## 2020-02-15 DIAGNOSIS — E0500 Thyrotoxicosis with diffuse goiter without thyrotoxic crisis or storm: Secondary | ICD-10-CM | POA: Diagnosis present

## 2020-02-15 DIAGNOSIS — D649 Anemia, unspecified: Secondary | ICD-10-CM

## 2020-02-15 DIAGNOSIS — E876 Hypokalemia: Secondary | ICD-10-CM | POA: Diagnosis present

## 2020-02-15 DIAGNOSIS — Z20822 Contact with and (suspected) exposure to covid-19: Secondary | ICD-10-CM | POA: Diagnosis present

## 2020-02-15 DIAGNOSIS — E875 Hyperkalemia: Secondary | ICD-10-CM | POA: Diagnosis not present

## 2020-02-15 DIAGNOSIS — K709 Alcoholic liver disease, unspecified: Secondary | ICD-10-CM | POA: Diagnosis present

## 2020-02-15 DIAGNOSIS — F10239 Alcohol dependence with withdrawal, unspecified: Secondary | ICD-10-CM

## 2020-02-15 DIAGNOSIS — D696 Thrombocytopenia, unspecified: Secondary | ICD-10-CM | POA: Diagnosis present

## 2020-02-15 DIAGNOSIS — Z9114 Patient's other noncompliance with medication regimen: Secondary | ICD-10-CM

## 2020-02-15 DIAGNOSIS — F10939 Alcohol use, unspecified with withdrawal, unspecified: Secondary | ICD-10-CM

## 2020-02-15 DIAGNOSIS — R569 Unspecified convulsions: Secondary | ICD-10-CM

## 2020-02-15 DIAGNOSIS — E440 Moderate protein-calorie malnutrition: Secondary | ICD-10-CM | POA: Insufficient documentation

## 2020-02-15 DIAGNOSIS — G4089 Other seizures: Secondary | ICD-10-CM | POA: Diagnosis present

## 2020-02-15 DIAGNOSIS — Z79890 Hormone replacement therapy (postmenopausal): Secondary | ICD-10-CM

## 2020-02-15 DIAGNOSIS — F1020 Alcohol dependence, uncomplicated: Secondary | ICD-10-CM | POA: Diagnosis present

## 2020-02-15 DIAGNOSIS — Z681 Body mass index (BMI) 19 or less, adult: Secondary | ICD-10-CM

## 2020-02-15 DIAGNOSIS — F17200 Nicotine dependence, unspecified, uncomplicated: Secondary | ICD-10-CM | POA: Diagnosis present

## 2020-02-15 DIAGNOSIS — K7040 Alcoholic hepatic failure without coma: Secondary | ICD-10-CM | POA: Diagnosis present

## 2020-02-15 DIAGNOSIS — E039 Hypothyroidism, unspecified: Secondary | ICD-10-CM | POA: Diagnosis present

## 2020-02-15 DIAGNOSIS — F10931 Alcohol use, unspecified with withdrawal delirium: Secondary | ICD-10-CM | POA: Diagnosis present

## 2020-02-15 LAB — CBC
HCT: 29.3 % — ABNORMAL LOW (ref 39.0–52.0)
Hemoglobin: 10.6 g/dL — ABNORMAL LOW (ref 13.0–17.0)
MCH: 35.7 pg — ABNORMAL HIGH (ref 26.0–34.0)
MCHC: 36.2 g/dL — ABNORMAL HIGH (ref 30.0–36.0)
MCV: 98.7 fL (ref 80.0–100.0)
Platelets: 51 10*3/uL — ABNORMAL LOW (ref 150–400)
RBC: 2.97 MIL/uL — ABNORMAL LOW (ref 4.22–5.81)
RDW: 15 % (ref 11.5–15.5)
WBC: 6.3 10*3/uL (ref 4.0–10.5)
nRBC: 0 % (ref 0.0–0.2)

## 2020-02-15 LAB — COMPREHENSIVE METABOLIC PANEL
ALT: 54 U/L — ABNORMAL HIGH (ref 0–44)
AST: 223 U/L — ABNORMAL HIGH (ref 15–41)
Albumin: 3.5 g/dL (ref 3.5–5.0)
Alkaline Phosphatase: 241 U/L — ABNORMAL HIGH (ref 38–126)
Anion gap: 16 — ABNORMAL HIGH (ref 5–15)
BUN: 8 mg/dL (ref 6–20)
CO2: 26 mmol/L (ref 22–32)
Calcium: 7.8 mg/dL — ABNORMAL LOW (ref 8.9–10.3)
Chloride: 93 mmol/L — ABNORMAL LOW (ref 98–111)
Creatinine, Ser: 0.89 mg/dL (ref 0.61–1.24)
GFR calc Af Amer: >60 mL/min (ref >60)
GFR calc non Af Amer: >60 mL/min (ref >60)
Glucose, Bld: 114 mg/dL — ABNORMAL HIGH (ref 70–99)
Potassium: 2.8 mmol/L — ABNORMAL LOW (ref 3.5–5.1)
Sodium: 135 mmol/L (ref 135–145)
Total Bilirubin: 4.7 mg/dL — ABNORMAL HIGH (ref 0.3–1.2)
Total Protein: 6.6 g/dL (ref 6.5–8.1)

## 2020-02-15 LAB — FOLATE: Folate: 5.8 ng/mL — ABNORMAL LOW (ref >5.9)

## 2020-02-15 LAB — IRON AND TIBC
Iron: 257 ug/dL — ABNORMAL HIGH (ref 45–182)
Saturation Ratios: 90 % — ABNORMAL HIGH (ref 17.9–39.5)
TIBC: 284 ug/dL (ref 250–450)
UIBC: 27 ug/dL

## 2020-02-15 LAB — AMMONIA: Ammonia: 34 umol/L (ref 9–35)

## 2020-02-15 LAB — T4, FREE: Free T4: 0.92 ng/dL (ref 0.61–1.12)

## 2020-02-15 LAB — MAGNESIUM: Magnesium: 0.8 mg/dL — CL (ref 1.7–2.4)

## 2020-02-15 LAB — PROTIME-INR
INR: 1.3 — ABNORMAL HIGH (ref 0.8–1.2)
Prothrombin Time: 15.7 seconds — ABNORMAL HIGH (ref 11.4–15.2)

## 2020-02-15 LAB — FERRITIN: Ferritin: 2956 ng/mL — ABNORMAL HIGH (ref 24–336)

## 2020-02-15 LAB — PHOSPHORUS: Phosphorus: 2 mg/dL — ABNORMAL LOW (ref 2.5–4.6)

## 2020-02-15 LAB — TSH: TSH: 14.349 u[IU]/mL — ABNORMAL HIGH (ref 0.350–4.500)

## 2020-02-15 LAB — ETHANOL: Alcohol, Ethyl (B): <10 mg/dL (ref <10)

## 2020-02-15 MED ORDER — THIAMINE HCL 100 MG PO TABS
100.0000 mg | ORAL_TABLET | Freq: Every day | ORAL | Status: DC
  Administered 2020-02-15: 100 mg via ORAL
  Filled 2020-02-15: qty 1

## 2020-02-15 MED ORDER — POTASSIUM CHLORIDE CRYS ER 20 MEQ PO TBCR: 40.0000 meq | EXTENDED_RELEASE_TABLET | Freq: Once | ORAL | Status: DC

## 2020-02-15 MED ORDER — MAGNESIUM SULFATE 2 GM/50ML IV SOLN
2.0000 g | Freq: Once | INTRAVENOUS | Status: AC
  Administered 2020-02-15: 2 g via INTRAVENOUS
  Filled 2020-02-15: qty 50

## 2020-02-15 MED ORDER — LORAZEPAM 1 MG PO TABS: 1.0000 mg | ORAL_TABLET | ORAL | Status: DC | PRN

## 2020-02-15 MED ORDER — ONDANSETRON HCL 4 MG/2ML IJ SOLN
4.0000 mg | Freq: Once | INTRAMUSCULAR | Status: AC
  Administered 2020-02-15: 4 mg via INTRAVENOUS
  Filled 2020-02-15: qty 2

## 2020-02-15 MED ORDER — LORAZEPAM 2 MG/ML IJ SOLN
0.0000 mg | Freq: Four times a day (QID) | INTRAMUSCULAR | Status: AC
  Administered 2020-02-16 – 2020-02-17 (×3): 2 mg via INTRAVENOUS
  Filled 2020-02-15 (×3): qty 1

## 2020-02-15 MED ORDER — LORAZEPAM 1 MG PO TABS
1.0000 mg | ORAL_TABLET | ORAL | Status: AC | PRN
  Administered 2020-02-18: 3 mg via ORAL
  Administered 2020-02-18: 2 mg via ORAL
  Filled 2020-02-15: qty 3
  Filled 2020-02-15: qty 2

## 2020-02-15 MED ORDER — ADULT MULTIVITAMIN W/MINERALS CH: 1.0000 | ORAL_TABLET | Freq: Every day | ORAL | Status: DC

## 2020-02-15 MED ORDER — THIAMINE HCL 100 MG/ML IJ SOLN
100.0000 mg | Freq: Every day | INTRAMUSCULAR | Status: DC
  Administered 2020-02-17 – 2020-02-20 (×2): 100 mg via INTRAVENOUS
  Filled 2020-02-15 (×3): qty 2

## 2020-02-15 MED ORDER — LORAZEPAM 2 MG/ML IJ SOLN: 1.0000 mg | INTRAMUSCULAR | Status: DC | PRN

## 2020-02-15 MED ORDER — ONDANSETRON HCL 4 MG PO TABS: 4.0000 mg | ORAL_TABLET | Freq: Four times a day (QID) | ORAL | Status: DC | PRN

## 2020-02-15 MED ORDER — ADULT MULTIVITAMIN W/MINERALS CH
1.0000 | ORAL_TABLET | Freq: Every day | ORAL | Status: DC
  Administered 2020-02-15: 1 via ORAL
  Filled 2020-02-15: qty 1

## 2020-02-15 MED ORDER — SODIUM CHLORIDE 0.9 % IV SOLN: INTRAVENOUS | Status: DC

## 2020-02-15 MED ORDER — LORAZEPAM 2 MG/ML IJ SOLN
0.0000 mg | Freq: Two times a day (BID) | INTRAMUSCULAR | Status: AC
  Administered 2020-02-17: 2 mg via INTRAVENOUS
  Filled 2020-02-15: qty 1

## 2020-02-15 MED ORDER — LORAZEPAM 1 MG PO TABS
1.0000 mg | ORAL_TABLET | Freq: Once | ORAL | Status: AC
  Administered 2020-02-15: 1 mg via ORAL
  Filled 2020-02-15: qty 1

## 2020-02-15 MED ORDER — LORAZEPAM 2 MG/ML IJ SOLN
1.0000 mg | INTRAMUSCULAR | Status: AC | PRN
  Administered 2020-02-15: 1 mg via INTRAVENOUS
  Administered 2020-02-17: 2 mg via INTRAVENOUS
  Filled 2020-02-15 (×2): qty 1

## 2020-02-15 MED ORDER — IBUPROFEN 200 MG PO TABS
400.0000 mg | ORAL_TABLET | Freq: Four times a day (QID) | ORAL | Status: DC | PRN
  Administered 2020-02-20: 400 mg via ORAL
  Filled 2020-02-15 (×3): qty 2
  Filled 2020-02-15: qty 1

## 2020-02-15 MED ORDER — ADULT MULTIVITAMIN W/MINERALS CH
1.0000 | ORAL_TABLET | Freq: Every day | ORAL | Status: DC
  Administered 2020-02-16 – 2020-02-20 (×5): 1 via ORAL
  Filled 2020-02-15 (×5): qty 1

## 2020-02-15 MED ORDER — SODIUM CHLORIDE 0.9 % IV SOLN: Freq: Once | INTRAVENOUS | Status: AC

## 2020-02-15 MED ORDER — THIAMINE HCL 100 MG PO TABS
100.0000 mg | ORAL_TABLET | Freq: Every day | ORAL | Status: DC
  Administered 2020-02-16 – 2020-02-19 (×3): 100 mg via ORAL
  Filled 2020-02-15 (×4): qty 1

## 2020-02-15 MED ORDER — FOLIC ACID 1 MG PO TABS
1.0000 mg | ORAL_TABLET | Freq: Every day | ORAL | Status: DC
  Administered 2020-02-16 – 2020-02-20 (×6): 1 mg via ORAL
  Filled 2020-02-15 (×5): qty 1

## 2020-02-15 MED ORDER — POTASSIUM CHLORIDE 10 MEQ/100ML IV SOLN
10.0000 meq | INTRAVENOUS | Status: AC
  Administered 2020-02-15 – 2020-02-16 (×2): 10 meq via INTRAVENOUS
  Filled 2020-02-15: qty 200

## 2020-02-15 MED ORDER — FOLIC ACID 1 MG PO TABS: 1.0000 mg | ORAL_TABLET | Freq: Every day | ORAL | Status: DC

## 2020-02-15 MED ORDER — ONDANSETRON HCL 4 MG/2ML IJ SOLN: 4.0000 mg | Freq: Four times a day (QID) | INTRAMUSCULAR | Status: DC | PRN

## 2020-02-15 NOTE — ED Notes (Signed)
Pt reports nausea 

## 2020-02-15 NOTE — ED Notes (Signed)
Pt back to bed att, call bell light at bedside

## 2020-02-15 NOTE — ED Notes (Signed)
This RN responds to Admitting MD speaking to pt and offering to get staff assistance, this RN saw pt crouched on floor in front of toilet, call bell is on, pt IV is removed, pt asked what happened and reports trying to get to toilet;   Dr Para March assessing pt; charge Lea called to update, per directions door open and bed alarm on

## 2020-02-15 NOTE — ED Notes (Addendum)
This RN at bedside to draw ethoanol level. Cari, NP and Dr. Mayford Knife at bedside talking to pt. Pt has ripped out his IV. Bleeding controlled. Informing pt how critical he is at this time. Pt refusing to stay.

## 2020-02-15 NOTE — ED Notes (Signed)
Call from wife att, wants update, Joshua Medina, [phone]

## 2020-02-15 NOTE — ED Triage Notes (Signed)
Pt ems from home for AMS. Per ems pt fell, hit head  and seen here x 1 wk ago. Since then he has been tired , sleepy. Today pt had a period of shaking and then becoming unresponsive for a short period of time. Pt gcs 14-15 here.

## 2020-02-15 NOTE — ED Provider Notes (Addendum)
ER Provider Note       Time seen: 6:53 PM    I have reviewed the vital signs and the nursing notes.  HISTORY   Chief Complaint Altered Mental Status Level V caveat: History/ROS limited by altered mental status   HPI Joshua Medina is a 34 y.o. male with a history of Graves' disease, thyroid disease who presents today for altered mental status.  According to EMS he fell and hit his head was seen here a week ago.  Since then has been tired and sleepy.  Reportedly he had a period of shaking and then became unresponsive for short period of time, he has had intermittent confusion.  Past Medical History:  Diagnosis Date  . Graves disease   . Thyroid disease     History reviewed. No pertinent surgical history.  Allergies Patient has no known allergies.  Review of Systems Constitutional: Negative for fever. Cardiovascular: Negative for chest pain. Respiratory: Negative for shortness of breath. Gastrointestinal: Negative for abdominal pain, vomiting and diarrhea. Musculoskeletal: Negative for back pain. Skin: Negative for rash. Neurological: Positive for confusion  All systems negative/normal/unremarkable except as stated in the HPI  ____________________________________________   PHYSICAL EXAM:  VITAL SIGNS: Vitals:   02/15/20 1746  BP: 117/81  Pulse: (!) 103  Resp: 16  Temp: 98.6 F (37 C)  SpO2: 99%    Constitutional: Chronically ill-appearing, mild distress Eyes: Mildly icteric ENT      Head: Normocephalic, some bruising is noted around the left eye      Nose: No congestion/rhinnorhea.      Mouth/Throat: Mucous membranes are moist.      Neck: No stridor. Cardiovascular: Normal rate, regular rhythm. No murmurs, rubs, or gallops. Respiratory: Normal respiratory effort without tachypnea nor retractions. Breath sounds are clear and equal bilaterally. No wheezes/rales/rhonchi. Gastrointestinal: Soft and nontender. Normal bowel sounds Musculoskeletal: Nontender  with normal range of motion in extremities. No lower extremity tenderness nor edema. Neurologic:  Normal speech and language. No gross focal neurologic deficits are appreciated.  Skin:  Skin is warm, bruising is noted with jaundice Psychiatric: Speech and behavior are normal.  ____________________________________________  EKG: Interpreted by me.  Sinus tachycardia the rate of 102 bpm, borderline right axis deviation, low voltage, normal QT  ____________________________________________   LABS (pertinent positives/negatives)  Labs Reviewed  COMPREHENSIVE METABOLIC PANEL - Abnormal; Notable for the following components:      Result Value   Potassium 2.8 (*)    Chloride 93 (*)    Glucose, Bld 114 (*)    Calcium 7.8 (*)    AST 223 (*)    ALT 54 (*)    Alkaline Phosphatase 241 (*)    Total Bilirubin 4.7 (*)    Anion gap 16 (*)    All other components within normal limits  CBC - Abnormal; Notable for the following components:   RBC 2.97 (*)    Hemoglobin 10.6 (*)    HCT 29.3 (*)    MCH 35.7 (*)    MCHC 36.2 (*)    Platelets 51 (*)    All other components within normal limits  PROTIME-INR - Abnormal; Notable for the following components:   Prothrombin Time 15.7 (*)    INR 1.3 (*)    All other components within normal limits  ETHANOL  TSH  T4, FREE  CBG MONITORING, ED    RADIOLOGY  Images were viewed by me CT head IMPRESSION: Normal head CT.  DIFFERENTIAL DIAGNOSIS  Alcohol withdrawal seizure, DTs, dehydration,  electrolyte abnormality, liver failure, coagulopathy  ASSESSMENT AND PLAN  Alcohol withdrawal seizure, severe alcohol use disorder   Plan: The patient had presented for likely severe alcohol withdrawal including seizure. Patient's labs revealed numerous abnormalities including hyperbilirubinemia, mild transaminitis, mildly elevated anion gap, and mild hypokalemia.  INR was slightly elevated.  He was given Ativan here, placed on CIWA protocol.  Patient does  seem to be intermittently confused and I'm uncomfortable with him going home.  I will IVC him and admit to medicine.  Lenise Arena MD    Note: This note was generated in part or whole with voice recognition software. Voice recognition is usually quite accurate but there are transcription errors that can and very often do occur. I apologize for any typographical errors that were not detected and corrected.     Earleen Newport, MD 02/15/20 Sandrea Hughs    Earleen Newport, MD 02/15/20 2050

## 2020-02-15 NOTE — ED Notes (Signed)
Safety sitter, Morrie Sheldon, EDT, at bedside

## 2020-02-15 NOTE — ED Notes (Signed)
Pt refused to go to head CT

## 2020-02-15 NOTE — H&P (Signed)
History and Physical    Joshua Medina ZOX:[id number] DOB: 1986/07/12 DOA: 02/15/2020  PCP: Patient, No Pcp Per   Patient coming from: Home  I have personally briefly reviewed patient's old medical records in [place]  Chief Complaint: Altered mental status, episode of shaking  HPI: Joshua Medina is a 34 y.o. male with medical history significant for alcohol use disorder currently trying to cut back, hypothyroidism, who was brought in by EMS with a several day history of lethargy, with a.  To date of jerking followed by a brief period of unresponsiveness.  Patient was seen in the emergency room a week prior after falling and hitting his head.  Since arrival to the emergency room has been a bit uncooperative, ripping out his IV and refusing to stay. ED Course: Work-up in the ER significant for abnormal LFTs with AST to ALT ratio 223: 54, bilirubin 4.7.  Had hypokalemia of 2.8.  Thrombocytopenia of 51,000 and anemia of 10.6.  Mild coagulopathy with INR 1.3.  EtOH level less than 10 CT head with no acute intracranial findings.  EKG showed sinus tachycardia at 102.  Pending labs at time of admission include magnesium, phosphorus and TSH.  Patient was given an IV fluid bolus as well as Ativan.  He was IVC'd for admission as he was not considered a safe discharge.  Review of Systems: History limited as patient is very confused  Past Medical History:  Diagnosis Date  . Graves disease   . Thyroid disease     History reviewed. No pertinent surgical history.   reports that he has been smoking. He has never used smokeless tobacco. He reports current alcohol use. No history on file for drug use.  No Known Allergies  Family history review deferred due to altered mental status.  Review of past record shows history of diabetes in maternal grandmother with no other pertinent family history   Prior to Admission medications   Medication Sig Start Date End Date Taking? Authorizing Provider    chlordiazePOXIDE (LIBRIUM) 25 MG capsule Take 1 capsule (25 mg total) by mouth 4 (four) times daily as needed. 06/20/19   Earleen Newport, MD  levothyroxine (SYNTHROID) 100 MCG tablet Take 1 tablet (100 mcg total) by mouth daily. 01/30/20 03/30/20  Blake Divine, MD    Physical Exam: Vitals:   02/15/20 1844 02/15/20 1900 02/15/20 2034 02/15/20 2100  BP: (!) 127/91 113/80  114/90  Pulse:   (!) 101 98  Resp:    18  Temp:      TempSrc:      SpO2:    100%  Weight:      Height:         Vitals:   02/15/20 1844 02/15/20 1900 02/15/20 2034 02/15/20 2100  BP: (!) 127/91 113/80  114/90  Pulse:   (!) 101 98  Resp:    18  Temp:      TempSrc:      SpO2:    100%  Weight:      Height:          Constitutional:  Somewhat ill looking, disoriented, oriented to person only. HEENT:      Head: Normocephalic and atraumatic.         Eyes: PERLA, EOMI, Conjunctivae are normal. Sclera is non-icteric.       Mouth/Throat: Mucous membranes are moist.       Neck: Supple with no signs of meningismus. Cardiovascular: Regular rate and rhythm. No murmurs, gallops, or rubs.  2+ symmetrical distal pulses are present . No JVD. No LE edema Respiratory: Respiratory effort normal .Lungs sounds clear bilaterally. No wheezes, crackles, or rhonchi.  Gastrointestinal: Soft, non tender, and non distended with positive bowel sounds. No rebound or guarding. Genitourinary: No CVA tenderness. Musculoskeletal: Nontender with normal range of motion in all extremities. No cyanosis, or erythema of extremities. Neurologic: Normal speech and language. Face is symmetric. Moving all extremities. No gross focal neurologic deficits . Skin: Skin is warm, dry.  No rash or ulcers Psychiatric: Unable to assess as patient very disoriented.  Does appear restless  Labs on Admission: I have personally reviewed following labs and imaging studies  CBC: Recent Labs  Lab 02/15/20 1750  WBC 6.3  HGB 10.6*  HCT 29.3*  MCV 98.7   PLT 51*   Basic Metabolic Panel: Recent Labs  Lab 02/15/20 1750  NA 135  K 2.8*  CL 93*  CO2 26  GLUCOSE 114*  BUN 8  CREATININE 0.89  CALCIUM 7.8*   GFR: Estimated Creatinine Clearance: 110.4 mL/min (by C-G formula based on SCr of 0.89 mg/dL). Liver Function Tests: Recent Labs  Lab 02/15/20 1750  AST 223*  ALT 54*  ALKPHOS 241*  BILITOT 4.7*  PROT 6.6  ALBUMIN 3.5   No results for input(s): LIPASE, AMYLASE in the last 168 hours. Recent Labs  Lab 02/15/20 2040  AMMONIA 34   Coagulation Profile: Recent Labs  Lab 02/15/20 1750  INR 1.3*   Cardiac Enzymes: No results for input(s): CKTOTAL, CKMB, CKMBINDEX, TROPONINI in the last 168 hours. BNP (last 3 results) No results for input(s): PROBNP in the last 8760 hours. HbA1C: No results for input(s): HGBA1C in the last 72 hours. CBG: No results for input(s): GLUCAP in the last 168 hours. Lipid Profile: No results for input(s): CHOL, HDL, LDLCALC, TRIG, CHOLHDL, LDLDIRECT in the last 72 hours. Thyroid Function Tests: No results for input(s): TSH, T4TOTAL, FREET4, T3FREE, THYROIDAB in the last 72 hours. Anemia Panel: No results for input(s): VITAMINB12, FOLATE, FERRITIN, TIBC, IRON, RETICCTPCT in the last 72 hours. Urine analysis: No results found for: COLORURINE, APPEARANCEUR, [place], GLUCOSEU, HGBUR, BILIRUBINUR, KETONESUR, PROTEINUR, UROBILINOGEN, NITRITE, LEUKOCYTESUR  Radiological Exams on Admission: CT Head Wo Contrast  Result Date: 02/15/2020 CLINICAL DATA:  Delirium.  Recent fall. EXAM: CT HEAD WITHOUT CONTRAST TECHNIQUE: Contiguous axial images were obtained from the base of the skull through the vertex without intravenous contrast. COMPARISON:  None. FINDINGS: Brain: There is no mass, hemorrhage or extra-axial collection. The size and configuration of the ventricles and extra-axial CSF spaces are normal. The brain parenchyma is normal, without acute or chronic infarction. Vascular: No abnormal  hyperdensity of the major intracranial arteries or dural venous sinuses. No intracranial atherosclerosis. Skull: The visualized skull base, calvarium and extracranial soft tissues are normal. Sinuses/Orbits: No fluid levels or advanced mucosal thickening of the visualized paranasal sinuses. No mastoid or middle ear effusion. The orbits are normal. IMPRESSION: Normal head CT. Electronically Signed   By: Ulyses Jarred M.D.   On: 02/15/2020 20:32    EKG: Independently reviewed. Interpretation : Sinus tachycardia at 102  Assessment/Plan  34 year old male with a history of heavy alcohol use, recently trying to cut back who presents by EMS with lethargy, and a shaking episode followed by brief unresponsiveness on the day of arrival.IVC"s in the ER as patient was uncooperative and refusing admission.  Work-up consistent with alcoholic liver disease.    Alcohol withdrawal seizure with complication, with delirium ([place])  Alcohol use disorder, moderate, dependence ([place]) -Patient with history of alcohol use, trying to cut back presenting with possible seizure, followed by mild agitation -Head CT with no acute findings -EtOH level less than 10 -Continue IVC with sitter -CIWA withdrawal protocol -Transitional care consult      Alcoholic liver disease ([place])    Thrombocytopenia ([place]) -Patient with elevated AST: ALT of 223: 54, alk phos 241, bilirubin 4.7, platelet count 51,000, and elevated PT with INR 1.3 -For alcohol abstinence counseling -Continued management as above    Anemia, acuity uncertain -Hemoglobin 72.6.  Was 11.110 days ago and 15  Eight months ago -Anemia panel on monitor H&H -Stool for occult blood    Hypokalemia -Oral supplementation -Monitor other electrolytes and correct as needed.   Hypothyroidism (acquired) -Graves' Disease diagnosed in 2011 and was treated with I-131 ablation in 2012 -Continue Synthroid 100 -Follow TSH   DVT prophylaxis: SCDs given thrombocytopenia Code  Status: full code  Family Communication:  none  Disposition Plan: Back to previous home environment Consults called: none  Status:At the time of admission, it appears that the appropriate admission status for this patient is INPATIENT. This is judged to be reasonable and necessary in order to provide the required intensity of service to ensure the patient's safety given the presenting symptoms, physical exam findings, and initial radiographic and laboratory data in the context of their  Comorbid conditions.   Patient requires inpatient status due to high intensity of service, high risk for further deterioration and high frequency of surveillance required.   I certify that at the point of admission it is my clinical judgment that the patient will require inpatient hospital care spanning beyond [place] MD [facility]     02/15/2020, 9:31 PM

## 2020-02-15 NOTE — ED Notes (Addendum)
Helped patient call his Dad. His Dad did not answer but he left a  Voice mail. He made another call top his wife. His wife asked to talk to me about his state of confusion and if it was improved. I told her I did not have any information to give her but could find his nurse. She said she would just be in touch later. Patient went to CT.

## 2020-02-16 ENCOUNTER — Encounter: Payer: Self-pay | Admitting: Internal Medicine

## 2020-02-16 DIAGNOSIS — F10231 Alcohol dependence with withdrawal delirium: Secondary | ICD-10-CM | POA: Diagnosis present

## 2020-02-16 DIAGNOSIS — F10931 Alcohol use, unspecified with withdrawal delirium: Secondary | ICD-10-CM | POA: Diagnosis present

## 2020-02-16 LAB — COMPREHENSIVE METABOLIC PANEL
ALT: 47 U/L — ABNORMAL HIGH (ref 0–44)
AST: 189 U/L — ABNORMAL HIGH (ref 15–41)
Albumin: 3.3 g/dL — ABNORMAL LOW (ref 3.5–5.0)
Alkaline Phosphatase: 214 U/L — ABNORMAL HIGH (ref 38–126)
Anion gap: 11 (ref 5–15)
BUN: 9 mg/dL (ref 6–20)
CO2: 27 mmol/L (ref 22–32)
Calcium: 7.4 mg/dL — ABNORMAL LOW (ref 8.9–10.3)
Chloride: 98 mmol/L (ref 98–111)
Creatinine, Ser: 0.73 mg/dL (ref 0.61–1.24)
GFR calc Af Amer: >60 mL/min (ref >60)
GFR calc non Af Amer: >60 mL/min (ref >60)
Glucose, Bld: 80 mg/dL (ref 70–99)
Potassium: 3.1 mmol/L — ABNORMAL LOW (ref 3.5–5.1)
Sodium: 136 mmol/L (ref 135–145)
Total Bilirubin: 4.6 mg/dL — ABNORMAL HIGH (ref 0.3–1.2)
Total Protein: 6 g/dL — ABNORMAL LOW (ref 6.5–8.1)

## 2020-02-16 LAB — CBC
HCT: 27 % — ABNORMAL LOW (ref 39.0–52.0)
Hemoglobin: 9.9 g/dL — ABNORMAL LOW (ref 13.0–17.0)
MCH: 36.3 pg — ABNORMAL HIGH (ref 26.0–34.0)
MCHC: 36.7 g/dL — ABNORMAL HIGH (ref 30.0–36.0)
MCV: 98.9 fL (ref 80.0–100.0)
Platelets: 46 10*3/uL — ABNORMAL LOW (ref 150–400)
RBC: 2.73 MIL/uL — ABNORMAL LOW (ref 4.22–5.81)
RDW: 15 % (ref 11.5–15.5)
WBC: 5.8 10*3/uL (ref 4.0–10.5)
nRBC: 0 % (ref 0.0–0.2)

## 2020-02-16 LAB — SARS CORONAVIRUS 2 BY RT PCR (HOSPITAL ORDER, PERFORMED IN ~~LOC~~ HOSPITAL LAB): SARS Coronavirus 2: NEGATIVE

## 2020-02-16 LAB — PROTIME-INR
INR: 1.3 — ABNORMAL HIGH (ref 0.8–1.2)
Prothrombin Time: 15.6 seconds — ABNORMAL HIGH (ref 11.4–15.2)

## 2020-02-16 LAB — URINALYSIS, ROUTINE W REFLEX MICROSCOPIC
Bacteria, UA: NONE SEEN
Glucose, UA: NEGATIVE mg/dL
Hgb urine dipstick: NEGATIVE
Ketones, ur: NEGATIVE mg/dL
Leukocytes,Ua: NEGATIVE
Nitrite: NEGATIVE
Protein, ur: 100 mg/dL — AB
Specific Gravity, Urine: 1.011 (ref 1.005–1.030)
pH: 7 (ref 5.0–8.0)

## 2020-02-16 LAB — HIV ANTIBODY (ROUTINE TESTING W REFLEX): HIV Screen 4th Generation wRfx: NONREACTIVE

## 2020-02-16 LAB — URINE DRUG SCREEN, QUALITATIVE (ARMC ONLY)
Amphetamines, Ur Screen: NOT DETECTED
Barbiturates, Ur Screen: NOT DETECTED
Benzodiazepine, Ur Scrn: POSITIVE — AB
Cannabinoid 50 Ng, Ur ~~LOC~~: NOT DETECTED
Cocaine Metabolite,Ur ~~LOC~~: NOT DETECTED
MDMA (Ecstasy)Ur Screen: NOT DETECTED
Methadone Scn, Ur: NOT DETECTED
Opiate, Ur Screen: NOT DETECTED
Phencyclidine (PCP) Ur S: NOT DETECTED
Tricyclic, Ur Screen: NOT DETECTED

## 2020-02-16 LAB — VITAMIN B12: Vitamin B-12: 636 pg/mL (ref 180–914)

## 2020-02-16 LAB — MAGNESIUM: Magnesium: 1.8 mg/dL (ref 1.7–2.4)

## 2020-02-16 MED ORDER — MAGNESIUM SULFATE 2 GM/50ML IV SOLN
2.0000 g | Freq: Once | INTRAVENOUS | Status: AC
  Administered 2020-02-16: 2 g via INTRAVENOUS
  Filled 2020-02-16: qty 50

## 2020-02-16 MED ORDER — POTASSIUM CHLORIDE CRYS ER 20 MEQ PO TBCR
40.0000 meq | EXTENDED_RELEASE_TABLET | Freq: Two times a day (BID) | ORAL | Status: AC
  Administered 2020-02-16 – 2020-02-17 (×3): 40 meq via ORAL
  Filled 2020-02-16 (×3): qty 2

## 2020-02-16 MED ORDER — LEVOTHYROXINE SODIUM 100 MCG PO TABS
100.0000 ug | ORAL_TABLET | Freq: Every day | ORAL | Status: DC
  Administered 2020-02-16 – 2020-02-20 (×4): 100 ug via ORAL
  Filled 2020-02-16 (×6): qty 1

## 2020-02-16 MED ORDER — POTASSIUM & SODIUM PHOSPHATES 280-160-250 MG PO PACK
2.0000 | PACK | Freq: Three times a day (TID) | ORAL | Status: DC
  Administered 2020-02-16 – 2020-02-17 (×6): 2 via ORAL
  Filled 2020-02-16 (×9): qty 2

## 2020-02-16 MED ORDER — POTASSIUM CHLORIDE 10 MEQ/100ML IV SOLN
10.0000 meq | Freq: Once | INTRAVENOUS | Status: AC
  Administered 2020-02-16: 10 meq via INTRAVENOUS
  Filled 2020-02-16: qty 100

## 2020-02-16 NOTE — ED Notes (Signed)
Pt provided phone to call wife 

## 2020-02-16 NOTE — Progress Notes (Signed)
PROGRESS NOTE    Kazumi Lachney  TIW:[id number] DOB: [birth year] DOA: 02/15/2020 PCP: Patient, No Pcp Per    Brief Narrative:  34 year old gentleman with alcoholism, hypothyroidism brought to the emergency room by EMS with several day history of lethargy, altered mentation, episodic jerking movements and brief unresponsiveness witnessed by patient's wife at home.  Patient was also seen in the emergency room 1 week ago after falling and hitting his head. In the emergency room, patient was uncooperative, agitated and ripping his IV.  Patient had severe electrolyte abnormalities, wife requested to do IVC and patient was IVC then admitted. Significant with liver function abnormalities.  Alcohol level normal.  CT head normal.  EKG with sinus tachycardia.  Magnesium is 0.08.  TSH more than 10.  IV fluid boluses and Ativan were given in ER.   Assessment & Plan:   Principal Problem:   Alcohol withdrawal seizure with complication, with delirium (HCC) Active Problems:   Alcohol use disorder, moderate, dependence (HCC)   Hypothyroidism (acquired)   Alcoholic liver disease (HCC)   Alcohol withdrawal seizure, with delirium (HCC)   Thrombocytopenia (HCC)   Anemia   Hypokalemia   Hypomagnesemia  Alcohol withdrawal seizure with complication, delirium Severe symptoms.  Agree with hospitalization to stabilize. CIWA protocol with high-dose benzodiazepines.  Seizure and fall precautions.  Use soft restraints if needed to protect IV line central depression from fall.  CT head was without evidence of any intracranial bleed and fall happened about a week ago. Continue multivitamins. Aggressive electrolyte replacement.  Hypothyroidism: Not on treatment.  TSH more than 10.  Start replacing.  Patient was able to take oral thyroxine.  We will continue.  Hypomagnesemia/hypokalemia: Severe and persistent.  Replace aggressively IV and oral.  Recheck levels.  Alcoholic liver disease with chronic thrombocytopenia:  Advanced alcoholic liver disease.  No evidence of fulminant hepatic failure.  We will continue to monitor.   DVT prophylaxis: SCDs Start: 02/15/20 2121   Code Status: Full code Family Communication: Wife on the phone Disposition Plan: Status is: Inpatient  Remains inpatient appropriate because:Hemodynamically unstable, Persistent severe electrolyte disturbances, IV treatments appropriate due to intensity of illness or inability to take PO and Inpatient level of care appropriate due to severity of illness   Dispo: The patient is from: Home              Anticipated d/c is to: Home              Anticipated d/c date is: 3 days              Patient currently is not medically stable to d/c.         Consultants:   None  Procedures:   None  Antimicrobials:   None   Subjective: Patient was seen and examined.  Remains in emergency room.  Sitter at the bedside.  Patient himself was quiet and calm, he was pleasantly confused.  He told me he lives with his mother, he lives with his wife. He is agreeable to stay in the hospital.  Denies any nausea vomiting.  Objective: Vitals:   02/16/20 1213 02/16/20 1214 02/16/20 1215 02/16/20 1300  BP:    125/67  Pulse: 85 83 96 86  Resp:      Temp:      TempSrc:      SpO2: 100% 100% 100% 100%  Weight:      Height:        Intake/Output Summary (Last 24 hours) at 02/16/2020 1348  Last data filed at 02/16/2020 1300 Gross per 24 hour  Intake 2355.99 ml  Output 400 ml  Net 1955.99 ml   Filed Weights   02/15/20 1747 02/16/20 0930  Weight: 68 kg 68 kg    Examination:  General exam: Appears calm and cooperative at this time. Confused.  Alert oriented x1.  Not impulsive at this time. Denies any hallucinations or delusions. Respiratory system: Clear to auscultation. Respiratory effort normal. Cardiovascular system: S1 & S2 heard, RRR. No JVD, murmurs, rubs, gallops or clicks. No pedal edema. Gastrointestinal system: Abdomen is  nondistended, soft and nontender. No organomegaly or masses felt. Normal bowel sounds heard. Moves all extremities. Psychiatry: Judgement and insight appear compromised.  Mood & affect anxious.    Data Reviewed: I have personally reviewed following labs and imaging studies  CBC: Recent Labs  Lab 02/15/20 1750 02/16/20 0445  WBC 6.3 5.8  HGB 10.6* 9.9*  HCT 29.3* 27.0*  MCV 98.7 98.9  PLT 51* 46*   Basic Metabolic Panel: Recent Labs  Lab 02/15/20 1750 02/15/20 2040 02/16/20 0445  NA 135  --  136  K 2.8*  --  3.1*  CL 93*  --  98  CO2 26  --  27  GLUCOSE 114*  --  80  BUN 8  --  9  CREATININE 0.89  --  0.73  CALCIUM 7.8*  --  7.4*  MG  --  0.8*  --   PHOS  --  2.0*  --    GFR: Estimated Creatinine Clearance: 122.8 mL/min (by C-G formula based on SCr of 0.73 mg/dL). Liver Function Tests: Recent Labs  Lab 02/15/20 1750 02/16/20 0445  AST 223* 189*  ALT 54* 47*  ALKPHOS 241* 214*  BILITOT 4.7* 4.6*  PROT 6.6 6.0*  ALBUMIN 3.5 3.3*   No results for input(s): LIPASE, AMYLASE in the last 168 hours. Recent Labs  Lab 02/15/20 2040  AMMONIA 34   Coagulation Profile: Recent Labs  Lab 02/15/20 1750 02/16/20 0445  INR 1.3* 1.3*   Cardiac Enzymes: No results for input(s): CKTOTAL, CKMB, CKMBINDEX, TROPONINI in the last 168 hours. BNP (last 3 results) No results for input(s): PROBNP in the last 8760 hours. HbA1C: No results for input(s): HGBA1C in the last 72 hours. CBG: No results for input(s): GLUCAP in the last 168 hours. Lipid Profile: No results for input(s): CHOL, HDL, LDLCALC, TRIG, CHOLHDL, LDLDIRECT in the last 72 hours. Thyroid Function Tests: Recent Labs    02/15/20 2040  TSH 14.349*  FREET4 0.92   Anemia Panel: Recent Labs    02/15/20 2040 02/15/20 2301  VITAMINB12  --  636  FOLATE 5.8*  --   FERRITIN 2,956*  --   TIBC 284  --   IRON 257*  --    Sepsis Labs: No results for input(s): PROCALCITON, LATICACIDVEN in the last 168  hours.  Recent Results (from the past 240 hour(s))  SARS Coronavirus 2 by RT PCR (hospital order, performed in [facility] hospital lab) Nasopharyngeal Nasopharyngeal Swab     Status: None   Collection Time: 02/15/20 11:01 PM   Specimen: Nasopharyngeal Swab  Result Value Ref Range Status   SARS Coronavirus 2 NEGATIVE NEGATIVE Final    Comment: (NOTE) SARS-CoV-2 target nucleic acids are NOT DETECTED.  The SARS-CoV-2 RNA is generally detectable in upper and lower respiratory specimens during the acute phase of infection. The lowest concentration of SARS-CoV-2 viral copies this assay can detect is 250 copies / mL. A negative  result does not preclude SARS-CoV-2 infection and should not be used as the sole basis for treatment or other patient management decisions.  A negative result may occur with improper specimen collection / handling, submission of specimen other than nasopharyngeal swab, presence of viral mutation(s) within the areas targeted by this assay, and inadequate number of viral copies (<250 copies / mL). A negative result must be combined with clinical observations, patient history, and epidemiological information.  Fact Sheet for Patients:   BoilerBrush.com.cy  Fact Sheet for Healthcare Providers: https://pope.com/  This test is not yet approved or  cleared by the [place] FDA and has been authorized for detection and/or diagnosis of SARS-CoV-2 by FDA under an Emergency Use Authorization (EUA).  This EUA will remain in effect (meaning this test can be used) for the duration of the COVID-19 declaration under Section 564(b)(1) of the Act, 21 U.S.C. section 360bbb-3(b)(1), unless the authorization is terminated or revoked sooner.  Performed at [facility], [address], [place], [place] [address]          Radiology Studies: CT Head Wo Contrast  Result Date: 02/15/2020 CLINICAL DATA:  Delirium.   Recent fall. EXAM: CT HEAD WITHOUT CONTRAST TECHNIQUE: Contiguous axial images were obtained from the base of the skull through the vertex without intravenous contrast. COMPARISON:  None. FINDINGS: Brain: There is no mass, hemorrhage or extra-axial collection. The size and configuration of the ventricles and extra-axial CSF spaces are normal. The brain parenchyma is normal, without acute or chronic infarction. Vascular: No abnormal hyperdensity of the major intracranial arteries or dural venous sinuses. No intracranial atherosclerosis. Skull: The visualized skull base, calvarium and extracranial soft tissues are normal. Sinuses/Orbits: No fluid levels or advanced mucosal thickening of the visualized paranasal sinuses. No mastoid or middle ear effusion. The orbits are normal. IMPRESSION: Normal head CT. Electronically Signed   By: Deatra Robinson M.D.   On: 02/15/2020 20:32        Scheduled Meds: . folic acid  1 mg Oral Daily  . levothyroxine  100 mcg Oral Q0600  . LORazepam  0-4 mg Intravenous Q6H   Followed by  . [START ON 02/17/2020] LORazepam  0-4 mg Intravenous Q12H  . multivitamin with minerals  1 tablet Oral Daily  . potassium chloride  40 mEq Oral BID  . thiamine  100 mg Intravenous Daily  . thiamine  100 mg Oral Daily   Continuous Infusions: . sodium chloride 75 mL/hr at 02/16/20 1304     LOS: 1 day    Time spent: 40 minutes    Dorcas Carrow, MD [facility] Pager [phone]

## 2020-02-16 NOTE — ED Notes (Signed)
John[place] EDT at bedside. Pt resting at this time

## 2020-02-16 NOTE — ED Notes (Signed)
Pt provided lunch tray.

## 2020-02-16 NOTE — ED Notes (Addendum)
Attempted to call report; room is currently unassigned to a Charity fundraiser, Consulting civil engineer unavailable at this time.

## 2020-02-16 NOTE — Plan of Care (Signed)
Came to floor at 1820 via stretcher. Care plan initiated, high fall risk, call bell in reach sitter at bedside pt IVC no distress noted

## 2020-02-16 NOTE — ED Notes (Signed)
Pt given breakfast tray. EDT Raymie set up tray for pt. Pt eating at this time. Pt denies any complaints. Pt is A&OX4

## 2020-02-16 NOTE — ED Notes (Signed)
Pt transported to 1A RM 152 via NT, Sydni; Engineer, materials also present for transportation.

## 2020-02-16 NOTE — ED Notes (Signed)
Pt IVC and pending medical admit.

## 2020-02-17 DIAGNOSIS — E440 Moderate protein-calorie malnutrition: Secondary | ICD-10-CM | POA: Insufficient documentation

## 2020-02-17 LAB — CBC WITH DIFFERENTIAL/PLATELET
Abs Immature Granulocytes: 0.04 10*3/uL (ref 0.00–0.07)
Basophils Absolute: 0 10*3/uL (ref 0.0–0.1)
Basophils Relative: 1 %
Eosinophils Absolute: 0.1 10*3/uL (ref 0.0–0.5)
Eosinophils Relative: 1 %
HCT: 25.5 % — ABNORMAL LOW (ref 39.0–52.0)
Hemoglobin: 9.2 g/dL — ABNORMAL LOW (ref 13.0–17.0)
Immature Granulocytes: 1 %
Lymphocytes Relative: 27 %
Lymphs Abs: 1.3 10*3/uL (ref 0.7–4.0)
MCH: 35.9 pg — ABNORMAL HIGH (ref 26.0–34.0)
MCHC: 36.1 g/dL — ABNORMAL HIGH (ref 30.0–36.0)
MCV: 99.6 fL (ref 80.0–100.0)
Monocytes Absolute: 0.7 10*3/uL (ref 0.1–1.0)
Monocytes Relative: 13 %
Neutro Abs: 2.9 10*3/uL (ref 1.7–7.7)
Neutrophils Relative %: 57 %
Platelets: 54 10*3/uL — ABNORMAL LOW (ref 150–400)
RBC: 2.56 MIL/uL — ABNORMAL LOW (ref 4.22–5.81)
RDW: 14.7 % (ref 11.5–15.5)
WBC: 5 10*3/uL (ref 4.0–10.5)
nRBC: 0 % (ref 0.0–0.2)

## 2020-02-17 LAB — COMPREHENSIVE METABOLIC PANEL
ALT: 43 U/L (ref 0–44)
AST: 146 U/L — ABNORMAL HIGH (ref 15–41)
Albumin: 3.1 g/dL — ABNORMAL LOW (ref 3.5–5.0)
Alkaline Phosphatase: 213 U/L — ABNORMAL HIGH (ref 38–126)
Anion gap: 9 (ref 5–15)
BUN: 8 mg/dL (ref 6–20)
CO2: 25 mmol/L (ref 22–32)
Calcium: 7.5 mg/dL — ABNORMAL LOW (ref 8.9–10.3)
Chloride: 103 mmol/L (ref 98–111)
Creatinine, Ser: 0.55 mg/dL — ABNORMAL LOW (ref 0.61–1.24)
GFR calc Af Amer: >60 mL/min (ref >60)
GFR calc non Af Amer: >60 mL/min (ref >60)
Glucose, Bld: 92 mg/dL (ref 70–99)
Potassium: 3.6 mmol/L (ref 3.5–5.1)
Sodium: 137 mmol/L (ref 135–145)
Total Bilirubin: 3.1 mg/dL — ABNORMAL HIGH (ref 0.3–1.2)
Total Protein: 5.7 g/dL — ABNORMAL LOW (ref 6.5–8.1)

## 2020-02-17 LAB — PHOSPHORUS: Phosphorus: 2.5 mg/dL (ref 2.5–4.6)

## 2020-02-17 LAB — MAGNESIUM: Magnesium: 1.5 mg/dL — ABNORMAL LOW (ref 1.7–2.4)

## 2020-02-17 MED ORDER — ENSURE ENLIVE PO LIQD
237.0000 mL | Freq: Two times a day (BID) | ORAL | Status: DC
  Administered 2020-02-17 – 2020-02-20 (×8): 237 mL via ORAL

## 2020-02-17 MED ORDER — NICOTINE 21 MG/24HR TD PT24
21.0000 mg | MEDICATED_PATCH | Freq: Every day | TRANSDERMAL | Status: DC
  Administered 2020-02-18 – 2020-02-20 (×4): 21 mg via TRANSDERMAL
  Filled 2020-02-17 (×4): qty 1

## 2020-02-17 MED ORDER — MAGNESIUM SULFATE 2 GM/50ML IV SOLN
2.0000 g | Freq: Once | INTRAVENOUS | Status: AC
  Administered 2020-02-17: 2 g via INTRAVENOUS
  Filled 2020-02-17: qty 50

## 2020-02-17 NOTE — Plan of Care (Signed)

## 2020-02-17 NOTE — Progress Notes (Signed)
Initial Nutrition Assessment  DOCUMENTATION CODES:   Non-severe (moderate) malnutrition in context of social or environmental circumstances  INTERVENTION:  Provide Ensure Enlive po BID, each supplement provides 350 kcal and 20 grams of protein. Patient prefers chocolate.  Encourage MVI daily, folic acid 1 mg daily, thiamine 100 mg daily.  Encouraged adequate intake of calories and protein at meals.  Continue monitoring potassium, phosphorus, and magnesium daily and replacing as needed as patient remains at risk for refeeding syndrome.  NUTRITION DIAGNOSIS:   Moderate Malnutrition related to social / environmental circumstances (EtOH abuse) as evidenced by moderate fat depletion, moderate muscle depletion, severe muscle depletion.  GOAL:   Patient will meet greater than or equal to 90% of their needs  MONITOR:   PO intake, Supplement acceptance, Labs, Weight trends, I & O's  REASON FOR ASSESSMENT:   Malnutrition Screening Tool    ASSESSMENT:   34 year old male with PMHx of hypothyroidism, Graves disease, EtOH abuse admitted with EtOH withdrawal seizure, delirium, alcoholic liver disease with chronic thrombocytopenia.   Met with patient at bedside. Safety sitter was present in room. Patient reports his appetite and intake has been decreased for the past 1-2 years. He reports it is related to his hypothyroidism. Patient reports that at home he eats 1-2 meals per day. His intake varies. He may have pizza, a meat with vegetables, or snack food as his meal. Patient reports he has had Ensure in the past and enjoyed the chocolate flavor. He is amenable to drinking Ensure again to help meet calorie/protein needs. Patient reports his appetite is improving now. He had finished about 75% of his breakfast tray present in room at time of RD assessment.   Patient reports his UBW was 175 lbs. He is unsure of his weight history or if he has been losing weight. RD obtained bed scale weight of  61.4 kg (135.36 lbs) today. Per review of chart patient was 74.8 kg (164.56 lbs) on 02/18/2016, so weight loss has likely occurred slowly over time.  Medications reviewed and include: folic acid 1 mg daily, levothyroxine, Ativan, MVI daily, potassium and sodium phosphates 2 packets TID and QHS, thiamine 100 mg daily IV, NS at 75 mL/hr.  Labs reviewed: Creatinine 0.55, Magnesium 1.5, Potassium and Phosphorus WNL. MD is monitoring electrolytes and replacing as needed.  Discussed with RN.  NUTRITION - FOCUSED PHYSICAL EXAM:    Most Recent Value  Orbital Region Moderate depletion  Upper Arm Region Moderate depletion  Thoracic and Lumbar Region Moderate depletion  Buccal Region Moderate depletion  Temple Region Moderate depletion  Clavicle Bone Region Severe depletion  Clavicle and Acromion Bone Region Moderate depletion  Scapular Bone Region Moderate depletion  Dorsal Hand Moderate depletion  Patellar Region Severe depletion  Anterior Thigh Region Severe depletion  Posterior Calf Region Severe depletion  Edema (RD Assessment) None  Hair Reviewed  Eyes Reviewed  Mouth Reviewed  Skin Reviewed  Nails Reviewed     Diet Order:   Diet Order            Diet regular Room service appropriate? Yes; Fluid consistency: Thin  Diet effective now                EDUCATION NEEDS:   No education needs have been identified at this time  Skin:  Skin Assessment: Reviewed RN Assessment  Last BM:  02/17/2020 - small type 7  Height:   Ht Readings from Last 1 Encounters:  02/16/20 '5\' 10"'  (1.778 m)  Weight:   Wt Readings from Last 1 Encounters:  02/17/20 61.4 kg   Ideal Body Weight:  75.5 kg  BMI:  Body mass index is 19.42 kg/m.  Estimated Nutritional Needs:   Kcal:  1800-2000  Protein:  90-100 grams  Fluid:  1.8-2 L/day  Jacklynn Barnacle, MS, RD, LDN Pager number available on Amion

## 2020-02-17 NOTE — Progress Notes (Signed)
Shift Summary:  Patient sitting quietly in bed at this time, however has bouts of impulsiveness, agitation and anxiety. Patient CIWA range from 4 to 11, during this shift. IVF continued. No c/o of pain during this shift.  Patient SR/ST on Tele. Pt IVC'd by wife. Sitter at bedside. Will continue to monitor.

## 2020-02-17 NOTE — Progress Notes (Signed)
PROGRESS NOTE    Townsend RogerScott Judy  ZOX:[id number] DOB: 07/08/1986 DOA: 02/15/2020 PCP: Patient, No Pcp Per    Brief Narrative:  34 year old gentleman with alcoholism, hypothyroidism brought to the emergency room by EMS with several day history of lethargy, altered mentation, episodic jerking movements and brief unresponsiveness witnessed by patient's wife at home.  Patient was also seen in the emergency room 1 week ago after falling and hitting his head. In the emergency room, patient was uncooperative, agitated and ripping his IV.  Patient had severe electrolyte abnormalities, wife requested to do IVC and patient was IVC then admitted. Significant with liver function abnormalities.  Alcohol level normal.  CT head normal.  EKG with sinus tachycardia.  Magnesium is 0.08.  TSH more than 10.  IV fluid boluses and Ativan were given in ER.   Assessment & Plan:   Principal Problem:   Alcohol withdrawal seizure with complication, with delirium (HCC) Active Problems:   Alcohol use disorder, moderate, dependence (HCC)   Hypothyroidism (acquired)   Alcoholic liver disease (HCC)   Alcohol withdrawal seizure, with delirium (HCC)   Thrombocytopenia (HCC)   Anemia   Hypokalemia   Hypomagnesemia   Alcohol withdrawal delirium (HCC)   Malnutrition of moderate degree  Alcohol withdrawal seizure with complication, delirium Admitted with similar symptoms.  Still with confusion and impulsiveness. CIWA protocol with high-dose benzodiazepines.  Seizure and fall precautions.  Use soft restraints if needed to protect IV line central depression from fall.  CT head was without evidence of any intracranial bleed and fall happened about a week ago. Continue multivitamins. Aggressive electrolyte replacement.  Hypothyroidism: Not on treatment.  TSH more than 10.  Started on oral replacement.  Hypomagnesemia: Persistent.  Replace aggressively today.  Recheck tomorrow morning.  Hypokalemia: Replace.  On a schedule  replacement.  Recheck tomorrow morning.  Alcoholic liver disease with chronic thrombocytopenia: Advanced alcoholic liver disease.  No evidence of fulminant hepatic failure.  We will continue to monitor.  Involuntary admission status: Apparently patient was involuntarily committed by ER physician on wife's request due to severe agitation, instability and altered mentation and self-harm.  Will consult psychiatry today to reevaluate and assess for involuntary commitment status. Patient probably does not meet criteria for involuntary commitment. He is agreeable to stay in the hospital and finished treatment.  Start mobility.  Work with PT OT.   DVT prophylaxis: SCDs Start: 02/15/20 2121   Code Status: Full code Family Communication: Wife on the phone 6/28 Disposition Plan: Status is: Inpatient  Remains inpatient appropriate because:Hemodynamically unstable, Persistent severe electrolyte disturbances, IV treatments appropriate due to intensity of illness or inability to take PO and Inpatient level of care appropriate due to severity of illness   Dispo: The patient is from: Home              Anticipated d/c is to: Home              Anticipated d/c date is: 2 to 3 days              Patient currently is not medically stable to d/c.         Consultants:   Psychiatry  Procedures:   None  Antimicrobials:   None   Subjective: Patient seen and examined.  No overnight events.  He has mostly remained quiet and calm.  He still confused.  He [place]. "I have my thyroid medicine, I think I will be good.  I will not to stop drinking alcohol but I will cut down"  Objective: Vitals:   02/16/20 1853 02/17/20 0100 02/17/20 0731 02/17/20 0913  BP: 100/81 98/68 101/74   Pulse: 78 80 71   Resp: 16 16 17    Temp: 98.1 F (36.7 C) 97.9 F (36.6 C) 98.3 F (36.8 C)   TempSrc: Oral Oral Oral   SpO2: 100% 100% 100%   Weight:    61.4 kg    Height:        Intake/Output Summary (Last 24 hours) at 02/17/2020 1120 Last data filed at 02/17/2020 1032 Gross per 24 hour  Intake 1242.73 ml  Output 1650 ml  Net -407.27 ml   Filed Weights   02/15/20 1747 02/16/20 0930 02/17/20 0913  Weight: 68 kg 68 kg 61.4 kg    Examination:  General exam: Appears calm and cooperative at this time. Confused.  Alert oriented x 1-2.  Not impulsive at this time. Denies any hallucinations or delusions. He has ecchymosis on the left eye because of the fall.  No complications. Respiratory system: Clear to auscultation. Respiratory effort normal. Cardiovascular system: S1 & S2 heard, RRR. No JVD, murmurs, rubs, gallops or clicks. No pedal edema. Gastrointestinal system: Abdomen is nondistended, soft and nontender. No organomegaly or masses felt. Normal bowel sounds heard. Moves all extremities. Psychiatry: Judgement and insight appear compromised.  Mood & affect anxious.    Data Reviewed: I have personally reviewed following labs and imaging studies  CBC: Recent Labs  Lab 02/15/20 1750 02/16/20 0445 02/17/20 0338  WBC 6.3 5.8 5.0  NEUTROABS  --   --  2.9  HGB 10.6* 9.9* 9.2*  HCT 29.3* 27.0* 25.5*  MCV 98.7 98.9 99.6  PLT 51* 46* 54*   Basic Metabolic Panel: Recent Labs  Lab 02/15/20 1750 02/15/20 2040 02/16/20 0445 02/16/20 1854 02/17/20 0338  NA 135  --  136  --  137  K 2.8*  --  3.1*  --  3.6  CL 93*  --  98  --  103  CO2 26  --  27  --  25  GLUCOSE 114*  --  80  --  92  BUN 8  --  9  --  8  CREATININE 0.89  --  0.73  --  0.55*  CALCIUM 7.8*  --  7.4*  --  7.5*  MG  --  0.8*  --  1.8 1.5*  PHOS  --  2.0*  --   --  2.5   GFR: Estimated Creatinine Clearance: 110.9 mL/min (A) (by C-G formula based on SCr of 0.55 mg/dL (L)). Liver Function Tests: Recent Labs  Lab 02/15/20 1750 02/16/20 0445 02/17/20 0338  AST 223* 189* 146*  ALT 54* 47* 43  ALKPHOS 241* 214* 213*  BILITOT 4.7* 4.6* 3.1*  PROT 6.6 6.0* 5.7*   ALBUMIN 3.5 3.3* 3.1*   No results for input(s): LIPASE, AMYLASE in the last 168 hours. Recent Labs  Lab 02/15/20 2040  AMMONIA 34   Coagulation Profile: Recent Labs  Lab 02/15/20 1750 02/16/20 0445  INR 1.3* 1.3*   Cardiac Enzymes: No results for input(s): CKTOTAL, CKMB, CKMBINDEX, TROPONINI in the last 168 hours. BNP (last 3 results) No results for input(s): PROBNP in the last 8760 hours. HbA1C: No results for input(s): HGBA1C in the last 72 hours. CBG: No results for input(s): GLUCAP in the last 168 hours. Lipid Profile: No results for input(s): CHOL, HDL, LDLCALC, TRIG, CHOLHDL, LDLDIRECT in the last 72  hours. Thyroid Function Tests: Recent Labs    02/15/20 2040  TSH 14.349*  FREET4 0.92   Anemia Panel: Recent Labs    02/15/20 2040 02/15/20 2301  VITAMINB12  --  636  FOLATE 5.8*  --   FERRITIN 2,956*  --   TIBC 284  --   IRON 257*  --    Sepsis Labs: No results for input(s): PROCALCITON, LATICACIDVEN in the last 168 hours.  Recent Results (from the past 240 hour(s))  SARS Coronavirus 2 by RT PCR (hospital order, performed in [facility] hospital lab) Nasopharyngeal Nasopharyngeal Swab     Status: None   Collection Time: 02/15/20 11:01 PM   Specimen: Nasopharyngeal Swab  Result Value Ref Range Status   SARS Coronavirus 2 NEGATIVE NEGATIVE Final    Comment: (NOTE) SARS-CoV-2 target nucleic acids are NOT DETECTED.  The SARS-CoV-2 RNA is generally detectable in upper and lower respiratory specimens during the acute phase of infection. The lowest concentration of SARS-CoV-2 viral copies this assay can detect is 250 copies / mL. A negative result does not preclude SARS-CoV-2 infection and should not be used as the sole basis for treatment or other patient management decisions.  A negative result may occur with improper specimen collection / handling, submission of specimen other than nasopharyngeal swab, presence of viral mutation(s) within the areas  targeted by this assay, and inadequate number of viral copies (<250 copies / mL). A negative result must be combined with clinical observations, patient history, and epidemiological information.  Fact Sheet for Patients:   BoilerBrush.com.cy  Fact Sheet for Healthcare Providers: https://pope.com/  This test is not yet approved or  cleared by the [place] FDA and has been authorized for detection and/or diagnosis of SARS-CoV-2 by FDA under an Emergency Use Authorization (EUA).  This EUA will remain in effect (meaning this test can be used) for the duration of the COVID-19 declaration under Section 564(b)(1) of the Act, 21 U.S.C. section 360bbb-3(b)(1), unless the authorization is terminated or revoked sooner.  Performed at [facility], [address] [place], [place] [address]          Radiology Studies: CT Head Wo Contrast  Result Date: 02/15/2020 CLINICAL DATA:  Delirium.  Recent fall. EXAM: CT HEAD WITHOUT CONTRAST TECHNIQUE: Contiguous axial images were obtained from the base of the skull through the vertex without intravenous contrast. COMPARISON:  None. FINDINGS: Brain: There is no mass, hemorrhage or extra-axial collection. The size and configuration of the ventricles and extra-axial CSF spaces are normal. The brain parenchyma is normal, without acute or chronic infarction. Vascular: No abnormal hyperdensity of the major intracranial arteries or dural venous sinuses. No intracranial atherosclerosis. Skull: The visualized skull base, calvarium and extracranial soft tissues are normal. Sinuses/Orbits: No fluid levels or advanced mucosal thickening of the visualized paranasal sinuses. No mastoid or middle ear effusion. The orbits are normal. IMPRESSION: Normal head CT. Electronically Signed   By: Deatra Robinson M.D.   On: 02/15/2020 20:32        Scheduled Meds:  feeding supplement (ENSURE ENLIVE)  237 mL Oral BID  BM   folic acid  1 mg Oral Daily   levothyroxine  100 mcg Oral Q0600   LORazepam  0-4 mg Intravenous Q6H   Followed by   LORazepam  0-4 mg Intravenous Q12H   multivitamin with minerals  1 tablet Oral Daily   potassium & sodium phosphates  2 packet Oral TID WC & HS   thiamine  100 mg  Intravenous Daily   thiamine  100 mg Oral Daily   Continuous Infusions:    LOS: 2 days    Time spent: 30 minutes    Dorcas Carrow, MD [facility] Pager [phone]

## 2020-02-17 NOTE — Evaluation (Signed)
Physical Therapy Evaluation Patient Details Name: Joshua Medina MRN: [id number] DOB: July 10, 1986 Today's Date: 02/17/2020   History of Present Illness  Pt is a 34 y.o. male presenting to hospital 6/27 with AMS (of note pt previously fell and hit his head and was in ED about 1 week prior); pt sleepy and tired; intermittent confusion; and reportedly had a period of shaking and became unresponsive for a short period of time.  PMH includes Grave's disease and thyroid disease.  Clinical Impression  Prior to hospital admission, pt was independent with ambulation; lives with his wife per chart review.  During session pt appearing impulsive and confused: pt looking under the bed and over top of the bed for a book that he said he had (although sitter reports pt did not have a book) requiring assist for safety.  Currently pt is SBA with bed mobility; min assist x2 for transfers; and progressed from mod assist x2 to min assist x2 with trial of RW and vc's to slow down and stay within RW with turns during ambulation.  No pain-like behavior noted during session.  General confusion noted (ex: pt initially insistent this therapist was his mother).  Pt would benefit from skilled PT to address noted impairments and functional limitations (see below for any additional details).  Anticipate pt's mobility will improve with cognitive improvement.  Upon hospital discharge, anticipate pt would benefit from 24/7 assist and HHPT.    Follow Up Recommendations Home health PT;Supervision/Assistance - 24 hour    Equipment Recommendations  Rolling walker with 5" wheels;3in1 (PT)    Recommendations for Other Services OT consult     Precautions / Restrictions Precautions Precautions: Fall Precaution Comments: IVC Restrictions Weight Bearing Restrictions: No      Mobility  Bed Mobility Overal bed mobility: Needs Assistance Bed Mobility: Supine to Sit;Sit to Supine     Supine to sit: Supervision;HOB elevated Sit to  supine: Supervision;HOB elevated   General bed mobility comments: SBA for safety d/t pt's impulsiveness  Transfers Overall transfer level: Needs assistance Equipment used: None Transfers: Sit to/from Stand Sit to Stand: Min assist;+2 physical assistance;+2 safety/equipment;Min guard         General transfer comment: min assist for balance (2nd assist CGA for safety); vc's for safety and to slow down  Ambulation/Gait Ambulation/Gait assistance: Min assist;+2 physical assistance;Mod assist Gait Distance (Feet): 200 Feet Assistive device: Rolling walker (2 wheeled);1 person hand held assist;None Gait Pattern/deviations: Step-through pattern Gait velocity: increased   General Gait Details: long step through gait pattern; very unsteady with increased B lateral sway ambulating without UE support requiring mod assist x2 for balance; unsteady with increased B lateral sway ambulating with single hand hold assist requiring min to mod assist x2 for balance; vc's required to stay within RW and to slow down (pt did fairly well with RW once given these cues)  Stairs            Wheelchair Mobility    Modified Rankin (Stroke Patients Only)       Balance Overall balance assessment: Needs assistance Sitting-balance support: No upper extremity supported;Feet supported Sitting balance-Leahy Scale: Good Sitting balance - Comments: steady sitting reaching within BOS   Standing balance support: No upper extremity supported Standing balance-Leahy Scale: Fair Standing balance comment: steady static standing but requiring assist for any dynamic movement                             Pertinent  Vitals/Pain Pain Assessment: No/denies pain  Vitals (HR and O2 on room air) stable and WFL throughout treatment session.    Home Living Family/patient expects to be discharged to:: Private residence Living Arrangements: Spouse/significant other                    Prior Function  Level of Independence: Independent               Hand Dominance        Extremity/Trunk Assessment   Upper Extremity Assessment Upper Extremity Assessment: Generalized weakness    Lower Extremity Assessment Lower Extremity Assessment: Generalized weakness    Cervical / Trunk Assessment Cervical / Trunk Assessment: Normal  Communication   Communication: No difficulties  Cognition Arousal/Alertness: Awake/alert Behavior During Therapy: Impulsive Overall Cognitive Status: No family/caregiver present to determine baseline cognitive functioning                                 General Comments: Oriented to person but not place, situation, or time      General Comments   Nursing cleared pt for participation in physical therapy.  Pt agreeable to PT session.    Exercises  Gait training with RW   Assessment/Plan    PT Assessment Patient needs continued PT services  PT Problem List Decreased strength;Decreased balance;Decreased mobility;Decreased coordination;Decreased knowledge of use of DME;Decreased cognition;Decreased safety awareness;Decreased knowledge of precautions       PT Treatment Interventions DME instruction;Gait training;Stair training;Functional mobility training;Therapeutic activities;Therapeutic exercise;Balance training;Patient/family education    PT Goals (Current goals can be found in the Care Plan section)  Acute Rehab PT Goals Patient Stated Goal: to improve walking and balance PT Goal Formulation: With patient Time For Goal Achievement: 03/02/20 Potential to Achieve Goals: Good    Frequency Min 2X/week   Barriers to discharge        Co-evaluation               AM-PAC PT "6 Clicks" Mobility  Outcome Measure Help needed turning from your back to your side while in a flat bed without using bedrails?: None Help needed moving from lying on your back to sitting on the side of a flat bed without using bedrails?: A  Little Help needed moving to and from a bed to a chair (including a wheelchair)?: A Little Help needed standing up from a chair using your arms (e.g., wheelchair or bedside chair)?: Total Help needed to walk in hospital room?: Total Help needed climbing 3-5 steps with a railing? : Total 6 Click Score: 13    End of Session Equipment Utilized During Treatment: Gait belt Activity Tolerance: Patient tolerated treatment well Patient left: in bed;with call bell/phone within reach;Other (comment) (sitter present who reported no bed alarm needed) Nurse Communication: Mobility status;Precautions PT Visit Diagnosis: Unsteadiness on feet (R26.81);Other abnormalities of gait and mobility (R26.89);Muscle weakness (generalized) (M62.81);History of falling (Z91.81)    Time: 9417-4081 PT Time Calculation (min) (ACUTE ONLY): 16 min   Charges:   PT Evaluation $PT Eval Low Complexity: 1 Low PT Treatments $Gait Training: 8-22 mins       Hendricks Limes, PT 02/17/20, 5:20 PM

## 2020-02-17 NOTE — Plan of Care (Signed)

## 2020-02-17 NOTE — Progress Notes (Signed)
Patient unsteady at times during shift. Given prn ativan x 2 this shift, adequate to helping with tremors and goosebumps , patient is still confused although he seems a little more oriented at 1800, appetite fair, patient confused with PT Irving Burton earlier and inferred that she was his  " mother". One x during shift patient patient got OOB and even when redirecting to use urinal he urinated in the sink and on the floor. Not redirectable at that time, 2 assist back to bed and changed his gown and chuks on bed. Patient able to feed self with no issue. Po fluids encouraged and accepted without difficulty , Consumed Chocolate Ensures x 2 today . He is in room with sitter.

## 2020-02-18 LAB — BASIC METABOLIC PANEL
Anion gap: 6 (ref 5–15)
BUN: 8 mg/dL (ref 6–20)
CO2: 28 mmol/L (ref 22–32)
Calcium: 8.5 mg/dL — ABNORMAL LOW (ref 8.9–10.3)
Chloride: 98 mmol/L (ref 98–111)
Creatinine, Ser: 0.72 mg/dL (ref 0.61–1.24)
GFR calc Af Amer: >60 mL/min (ref >60)
GFR calc non Af Amer: >60 mL/min (ref >60)
Glucose, Bld: 117 mg/dL — ABNORMAL HIGH (ref 70–99)
Potassium: 4.2 mmol/L (ref 3.5–5.1)
Sodium: 132 mmol/L — ABNORMAL LOW (ref 135–145)

## 2020-02-18 LAB — COMPREHENSIVE METABOLIC PANEL
ALT: 41 U/L (ref 0–44)
AST: 103 U/L — ABNORMAL HIGH (ref 15–41)
Albumin: 3.2 g/dL — ABNORMAL LOW (ref 3.5–5.0)
Alkaline Phosphatase: 223 U/L — ABNORMAL HIGH (ref 38–126)
Anion gap: 7 (ref 5–15)
BUN: 9 mg/dL (ref 6–20)
CO2: 28 mmol/L (ref 22–32)
Calcium: 8.7 mg/dL — ABNORMAL LOW (ref 8.9–10.3)
Chloride: 100 mmol/L (ref 98–111)
Creatinine, Ser: 0.73 mg/dL (ref 0.61–1.24)
GFR calc Af Amer: >60 mL/min (ref >60)
GFR calc non Af Amer: >60 mL/min (ref >60)
Glucose, Bld: 112 mg/dL — ABNORMAL HIGH (ref 70–99)
Potassium: 5.5 mmol/L — ABNORMAL HIGH (ref 3.5–5.1)
Sodium: 135 mmol/L (ref 135–145)
Total Bilirubin: 2 mg/dL — ABNORMAL HIGH (ref 0.3–1.2)
Total Protein: 6.1 g/dL — ABNORMAL LOW (ref 6.5–8.1)

## 2020-02-18 LAB — MAGNESIUM: Magnesium: 1.3 mg/dL — ABNORMAL LOW (ref 1.7–2.4)

## 2020-02-18 LAB — PHOSPHORUS: Phosphorus: 4.4 mg/dL (ref 2.5–4.6)

## 2020-02-18 MED ORDER — MAGNESIUM SULFATE 4 GM/100ML IV SOLN
4.0000 g | Freq: Once | INTRAVENOUS | Status: AC
  Administered 2020-02-18: 4 g via INTRAVENOUS
  Filled 2020-02-18: qty 100

## 2020-02-18 NOTE — Plan of Care (Signed)

## 2020-02-18 NOTE — Progress Notes (Signed)
PROGRESS NOTE   Joshua Medina  SAY:[id number]    DOB: 05-15-1986    DOA: 02/15/2020  PCP: Patient, No Pcp Per   I have briefly reviewed patients previous medical records in [facility]  Chief Complaint  Patient presents with  . Altered Mental Status    Brief Narrative:  34 year old male with PMH of iatrogenic hypothyroidism, alcohol dependence, per EMS reportedly fell and hit his head and seen in ED a week prior to this admission, since then has been tired and sleepy, presented to ED on 6/27 following a.  Of shaking, then became unresponsive for a short period of time and has had intermittent confusion.  Admitted for suspected severe alcohol withdrawal including seizure.  He was IVC in the ED by EDP.  Ongoing confusion/visual and auditory hallucinations.  ED course:  Work-up in the ER significant for abnormal LFTs with AST to ALT ratio 223: 54, bilirubin 4.7.  Had hypokalemia of 2.8.  Thrombocytopenia of 51,000 and anemia of 10.6.  Mild coagulopathy with INR 1.3.  EtOH level less than 10 CT head with no acute intracranial findings.  EKG showed sinus tachycardia at 102.   Assessment & Plan:  Principal Problem:   Alcohol withdrawal seizure with complication, with delirium (HCC) Active Problems:   Alcohol use disorder, moderate, dependence (HCC)   Hypothyroidism (acquired)   Alcoholic liver disease (HCC)   Alcohol withdrawal seizure, with delirium (HCC)   Thrombocytopenia (HCC)   Anemia   Hypokalemia   Hypomagnesemia   Alcohol withdrawal delirium (HCC)   Malnutrition of moderate degree   Alcohol dependence/alcohol withdrawal delirium and seizure: History of alcohol dependence, reportedly trying to cut back presented with possible seizure.  Head CT without acute findings.  Blood alcohol level less than 10.  IVC by EDP and has safety sitter due to ongoing confusion, unable to make medical decisions for self and unsafe to sign out AMA.  No further seizures reported.  Had CIWA scores  of 17 and 13 last night and early this morning, improved to 5 later.  Continue CIWA protocol and close monitoring.  Seizure precautions.  Needs to quit alcohol.  Hypothyroidism: May be noncompliant with medications.  TSH >10.  Started on levothyroxine 100 mcg daily.  Repeat TSH in 4 to 6 weeks.  Hypomagnesemia: Persistent.  Magnesium 1.3.  Replace aggressively IV and follow magnesium and replace as needed.  Potassium was 5.5 this morning, may be a lab error, no comment regarding hemolysis.  Repeated and normal at 4.2.  Hypokalemia: See discussion above.  Potassium normal.  Alcoholic liver disease: Transaminitis and bilirubin improving now that he has been off of alcohol.  Thrombocytopenia: Secondary to alcoholism and liver disease.  Stable.  No bleeding reported.  Anemia: May be due to chronic disease versus bone marrow toxic effect of alcohol.  Stable.  IVC status: Psychiatry consulted yesterday but input still pending.  He currently does not have medical capacity to make safe medical decisions for himself and hence we will continue IVC status pending psych input.  Body mass index is 19.42 kg/m.  Nutritional Status Nutrition Problem: Moderate Malnutrition Etiology: social / environmental circumstances (EtOH abuse) Signs/Symptoms: moderate fat depletion, moderate muscle depletion, severe muscle depletion Interventions: Refer to RD note for recommendations  DVT prophylaxis: SCDs Code Status: Full Family Communication: None at bedside Disposition:  Status is: Inpatient  Remains inpatient appropriate because:Persistent severe electrolyte disturbances   Dispo: The patient is from: Home  Anticipated d/c is to: Home              Anticipated d/c date is: 3 days              Patient currently is not medically stable to d/c.        Consultants:   Psychiatry  Procedures:   None  Antimicrobials:   None   Subjective:  Patient seen and examined along with safety  sitter at bedside.  Alert and oriented to self, "helpless".  Reports on lightheadedness but no pain.  "I like drinking".  "I have liver problems".  As per safety sitter, impulsive, frequently attempts to get out of bed, unsteady and fall risk, ongoing intermittent visual and auditory hallucinations.  Objective:   Vitals:   02/17/20 2040 02/18/20 0351 02/18/20 0737 02/18/20 1552  BP: 102/75 110/85 104/82 101/71  Pulse: 84 77 74 94  Resp: 12 16 18 18   Temp: 98.4 F (36.9 C) (!) 97.2 F (36.2 C) 98.2 F (36.8 C) 98.3 F (36.8 C)  TempSrc: Oral Oral Oral Oral  SpO2: 100% 100% 100% 100%  Weight:      Height:        General exam: Pleasant young male, moderately built and nourished lying comfortably propped up in bed.  Head sheet pulled over his head when I walked into his room. Respiratory system: Clear to auscultation. Respiratory effort normal. Cardiovascular system: S1 & S2 heard, RRR. No JVD, murmurs, rubs, gallops or clicks. No pedal edema.  Not on telemetry. Gastrointestinal system: Abdomen is nondistended, soft and nontender. No organomegaly or masses felt. Normal bowel sounds heard. Central nervous system: Alert and oriented to self and partly to place. No focal neurological deficits.  Follows simple instructions.  No asterixis. Extremities: Symmetric 5 x 5 power. Skin: No rashes, lesions or ulcers Psychiatry: Judgement and insight impaired. Mood & affect appropriate.     Data Reviewed:   I have personally reviewed following labs and imaging studies   CBC: Recent Labs  Lab 02/15/20 1750 02/16/20 0445 02/17/20 0338  WBC 6.3 5.8 5.0  NEUTROABS  --   --  2.9  HGB 10.6* 9.9* 9.2*  HCT 29.3* 27.0* 25.5*  MCV 98.7 98.9 99.6  PLT 51* 46* 54*    Basic Metabolic Panel: Recent Labs  Lab 02/15/20 2040 02/16/20 0445 02/16/20 1854 02/17/20 0338 02/18/20 0404 02/18/20 1217  NA  --    < >  --  137 135 132*  K  --    < >  --  3.6 5.5* 4.2  CL  --    < >  --  103 100 98    CO2  --    < >  --  25 28 28   GLUCOSE  --    < >  --  92 112* 117*  BUN  --    < >  --  8 9 8   CREATININE  --    < >  --  0.55* 0.73 0.72  CALCIUM  --    < >  --  7.5* 8.7* 8.5*  MG 0.8*  --  1.8 1.5* 1.3*  --   PHOS 2.0*  --   --  2.5 4.4  --    < > = values in this interval not displayed.    Liver Function Tests: Recent Labs  Lab 02/16/20 0445 02/17/20 0338 02/18/20 0404  AST 189* 146* 103*  ALT 47* 43 41  ALKPHOS 214* 213* 223*  BILITOT 4.6* 3.1* 2.0*  PROT 6.0* 5.7* 6.1*  ALBUMIN 3.3* 3.1* 3.2*    CBG: No results for input(s): GLUCAP in the last 168 hours.  Microbiology Studies:   Recent Results (from the past 240 hour(s))  SARS Coronavirus 2 by RT PCR (hospital order, performed in [facility] hospital lab) Nasopharyngeal Nasopharyngeal Swab     Status: None   Collection Time: 02/15/20 11:01 PM   Specimen: Nasopharyngeal Swab  Result Value Ref Range Status   SARS Coronavirus 2 NEGATIVE NEGATIVE Final    Comment: (NOTE) SARS-CoV-2 target nucleic acids are NOT DETECTED.  The SARS-CoV-2 RNA is generally detectable in upper and lower respiratory specimens during the acute phase of infection. The lowest concentration of SARS-CoV-2 viral copies this assay can detect is 250 copies / mL. A negative result does not preclude SARS-CoV-2 infection and should not be used as the sole basis for treatment or other patient management decisions.  A negative result may occur with improper specimen collection / handling, submission of specimen other than nasopharyngeal swab, presence of viral mutation(s) within the areas targeted by this assay, and inadequate number of viral copies (<250 copies / mL). A negative result must be combined with clinical observations, patient history, and epidemiological information.  Fact Sheet for Patients:   BoilerBrush.com.cy  Fact Sheet for Healthcare Providers: https://pope.com/  This test is  not yet approved or  cleared by the [place] FDA and has been authorized for detection and/or diagnosis of SARS-CoV-2 by FDA under an Emergency Use Authorization (EUA).  This EUA will remain in effect (meaning this test can be used) for the duration of the COVID-19 declaration under Section 564(b)(1) of the Act, 21 U.S.C. section 360bbb-3(b)(1), unless the authorization is terminated or revoked sooner.  Performed at [facility], [address] [place], [place] [address]      Radiology Studies:  No results found.   Scheduled Meds:   . feeding supplement (ENSURE ENLIVE)  237 mL Oral BID BM  . folic acid  1 mg Oral Daily  . levothyroxine  100 mcg Oral Q0600  . LORazepam  0-4 mg Intravenous Q12H  . multivitamin with minerals  1 tablet Oral Daily  . nicotine  21 mg Transdermal Daily  . thiamine  100 mg Intravenous Daily  . thiamine  100 mg Oral Daily    Continuous Infusions:     LOS: 3 days     Marcellus Kj, MD, [place], [facility]. [facility]    To contact the attending provider between 7A-7P or the covering provider during after hours 7P-7A, please log into the web site [email or web address] and access using universal [place] password for that web site. If you do not have the password, please call the hospital operator.  02/18/2020, 4:28 PM

## 2020-02-19 LAB — CBC
HCT: 27.1 % — ABNORMAL LOW (ref 39.0–52.0)
Hemoglobin: 9.8 g/dL — ABNORMAL LOW (ref 13.0–17.0)
MCH: 35.9 pg — ABNORMAL HIGH (ref 26.0–34.0)
MCHC: 36.2 g/dL — ABNORMAL HIGH (ref 30.0–36.0)
MCV: 99.3 fL (ref 80.0–100.0)
Platelets: 93 10*3/uL — ABNORMAL LOW (ref 150–400)
RBC: 2.73 MIL/uL — ABNORMAL LOW (ref 4.22–5.81)
RDW: 15.6 % — ABNORMAL HIGH (ref 11.5–15.5)
WBC: 6.6 10*3/uL (ref 4.0–10.5)
nRBC: 0.3 % — ABNORMAL HIGH (ref 0.0–0.2)

## 2020-02-19 LAB — COMPREHENSIVE METABOLIC PANEL
ALT: 37 U/L (ref 0–44)
AST: 65 U/L — ABNORMAL HIGH (ref 15–41)
Albumin: 3.2 g/dL — ABNORMAL LOW (ref 3.5–5.0)
Alkaline Phosphatase: 208 U/L — ABNORMAL HIGH (ref 38–126)
Anion gap: 9 (ref 5–15)
BUN: 12 mg/dL (ref 6–20)
CO2: 28 mmol/L (ref 22–32)
Calcium: 9.3 mg/dL (ref 8.9–10.3)
Chloride: 98 mmol/L (ref 98–111)
Creatinine, Ser: 0.78 mg/dL (ref 0.61–1.24)
GFR calc Af Amer: >60 mL/min (ref >60)
GFR calc non Af Amer: >60 mL/min (ref >60)
Glucose, Bld: 112 mg/dL — ABNORMAL HIGH (ref 70–99)
Potassium: 5.2 mmol/L — ABNORMAL HIGH (ref 3.5–5.1)
Sodium: 135 mmol/L (ref 135–145)
Total Bilirubin: 1.8 mg/dL — ABNORMAL HIGH (ref 0.3–1.2)
Total Protein: 6.2 g/dL — ABNORMAL LOW (ref 6.5–8.1)

## 2020-02-19 LAB — BASIC METABOLIC PANEL
Anion gap: 9 (ref 5–15)
BUN: 12 mg/dL (ref 6–20)
CO2: 27 mmol/L (ref 22–32)
Calcium: 9.4 mg/dL (ref 8.9–10.3)
Chloride: 98 mmol/L (ref 98–111)
Creatinine, Ser: 0.85 mg/dL (ref 0.61–1.24)
GFR calc Af Amer: >60 mL/min (ref >60)
GFR calc non Af Amer: >60 mL/min (ref >60)
Glucose, Bld: 122 mg/dL — ABNORMAL HIGH (ref 70–99)
Potassium: 5.3 mmol/L — ABNORMAL HIGH (ref 3.5–5.1)
Sodium: 134 mmol/L — ABNORMAL LOW (ref 135–145)

## 2020-02-19 LAB — MAGNESIUM
Magnesium: 1.3 mg/dL — ABNORMAL LOW (ref 1.7–2.4)
Magnesium: 1.3 mg/dL — ABNORMAL LOW (ref 1.7–2.4)

## 2020-02-19 MED ORDER — MAGNESIUM SULFATE 4 GM/100ML IV SOLN
4.0000 g | Freq: Once | INTRAVENOUS | Status: AC
  Administered 2020-02-19: 4 g via INTRAVENOUS
  Filled 2020-02-19: qty 100

## 2020-02-19 NOTE — Progress Notes (Signed)
Physical Therapy Treatment Patient Details Name: Joshua Medina MRN: [id number] DOB: Jan 02, 1986 Today's Date: 02/19/2020    History of Present Illness Pt is a 34 y.o. male presenting to hospital 6/27 with AMS (of note pt previously fell and hit his head and was in ED about 1 week prior); pt sleepy and tired; intermittent confusion; and reportedly had a period of shaking and became unresponsive for a short period of time.  PMH includes Grave's disease and thyroid disease.    PT Comments    Pt was long sitting in bed with sitter present upon arriving. He greets therapist and is cooperative and pleasant throughout. Was able to exit/re-enter bed without assistance. He however does require constant CGA and even min assist when OOB. Pt is high fall risk. Ambulated 500 ft without AD + gait belt. Has 4 episodes of LOB with therapist assistance to prevent fall. Scissoring and poor coordination impacts pt's safety. He returned to room and was repositioned in bed with sitter present. Therapist highly recommends pt use RW with RN staff until balance/coordination improves. Recommend DC to home with HHPT + 24 hour assist/supervision 2/2 to high fall risk. Acute PT will continue to monitor and follow per current POC.   Follow Up Recommendations  Home health PT;Supervision/Assistance - 24 hour     Equipment Recommendations  Rolling walker with 5" wheels;3in1 (PT)    Recommendations for Other Services       Precautions / Restrictions Precautions Precautions: Fall Restrictions Weight Bearing Restrictions: No    Mobility  Bed Mobility Overal bed mobility: Needs Assistance Bed Mobility: Supine to Sit;Sit to Supine     Supine to sit: Supervision;HOB elevated Sit to supine: Supervision;HOB elevated      Transfers Overall transfer level: Needs assistance Equipment used: None Transfers: Sit to/from Stand Sit to Stand: Min guard         General transfer comment: CGA for safety with constant vcs  for slowing down. pt very impulsive.  Ambulation/Gait Ambulation/Gait assistance: Min guard;Min assist Gait Distance (Feet): 500 Feet Assistive device: None Gait Pattern/deviations: Scissoring;Narrow base of support Gait velocity: WNL   General Gait Details: pt ambulated 500 ft without AD + gait belt. CGA mostly however pt have several occasions (4x) LOB with min assist to prevent fall. Pt was cued throughout for improved BOS and heel strike. Overall tolerated ambulation well but does report fatigue by the end. Vitals stable throughout. recommend use of RW with RN staff   Stairs             Wheelchair Mobility    Modified Rankin (Stroke Patients Only)       Balance Overall balance assessment: Needs assistance Sitting-balance support: No upper extremity supported;Feet supported Sitting balance-Leahy Scale: Good Sitting balance - Comments: no LOB in sitting   Standing balance support: No upper extremity supported Standing balance-Leahy Scale: Poor (fair in static/ poor in dynamic) Standing balance comment: pt has several occasions of LOB with dynamic activities. gait belt used for safety throughout.                            Cognition Arousal/Alertness: Awake/alert Behavior During Therapy: Flat affect Overall Cognitive Status: Impaired/Different from baseline                                 General Comments: Pt was cooperative and pleasant throughout. Does have cognition deficits  that are different from baseline.      Exercises      General Comments        Pertinent Vitals/Pain Pain Assessment: No/denies pain    Home Living                      Prior Function            PT Goals (current goals can now be found in the care plan section) Acute Rehab PT Goals Patient Stated Goal: to improve walking and balance Progress towards PT goals: Progressing toward goals    Frequency    Min 2X/week      PT Plan Current  plan remains appropriate    Co-evaluation              AM-PAC PT "6 Clicks" Mobility   Outcome Measure  Help needed turning from your back to your side while in a flat bed without using bedrails?: None Help needed moving from lying on your back to sitting on the side of a flat bed without using bedrails?: A Little Help needed moving to and from a bed to a chair (including a wheelchair)?: A Little Help needed standing up from a chair using your arms (e.g., wheelchair or bedside chair)?: A Little Help needed to walk in hospital room?: A Lot Help needed climbing 3-5 steps with a railing? : A Lot 6 Click Score: 17    End of Session Equipment Utilized During Treatment: Gait belt Activity Tolerance: Patient tolerated treatment well Patient left: in bed;with call bell/phone within reach;Other (comment);with nursing/sitter in room Psychiatrist present ) Nurse Communication: Mobility status;Precautions PT Visit Diagnosis: Unsteadiness on feet (R26.81);Other abnormalities of gait and mobility (R26.89);Muscle weakness (generalized) (M62.81);History of falling (Z91.81)     Time: 3545-6256 PT Time Calculation (min) (ACUTE ONLY): 12 min  Charges:  $Gait Training: 8-22 mins                     Jetta Lout PTA 02/19/20, 4:05 PM

## 2020-02-19 NOTE — Progress Notes (Addendum)
PROGRESS NOTE   Joshua Medina  IRS:[id number]    DOB: 1985-11-02    DOA: 02/15/2020  PCP: Patient, No Pcp Per   I have briefly reviewed patients previous medical records in [facility].  Chief Complaint  Patient presents with  . Altered Mental Status    Brief Narrative:  34 year old male with PMH of iatrogenic hypothyroidism, alcohol dependence, per EMS reportedly fell and hit his head and seen in ED a week prior to this admission, since then has been tired and sleepy, presented to ED on 6/27 following a.  Of shaking, then became unresponsive for a short period of time and has had intermittent confusion.  Admitted for suspected severe alcohol withdrawal including seizure.  He was IVC in the ED by EDP.  Ongoing confusion/visual and auditory hallucinations.  ED course:  Work-up in the ER significant for abnormal LFTs with AST to ALT ratio 223: 54, bilirubin 4.7.  Had hypokalemia of 2.8.  Thrombocytopenia of 51,000 and anemia of 10.6.  Mild coagulopathy with INR 1.3.  EtOH level less than 10 CT head with no acute intracranial findings.  EKG showed sinus tachycardia at 102.  Slowly improving.  Ongoing severe significant electrolyte abnormality/hypomagnesemia, replacing and following as needed.  Psychiatric consulted-awaiting input.   Assessment & Plan:  Principal Problem:   Alcohol withdrawal seizure with complication, with delirium (HCC) Active Problems:   Alcohol use disorder, moderate, dependence (HCC)   Hypothyroidism (acquired)   Alcoholic liver disease (HCC)   Alcohol withdrawal seizure, with delirium (HCC)   Thrombocytopenia (HCC)   Anemia   Hypokalemia   Hypomagnesemia   Alcohol withdrawal delirium (HCC)   Malnutrition of moderate degree   Alcohol dependence/alcohol withdrawal delirium and seizure: History of alcohol dependence, reportedly trying to cut back presented with possible seizure.  Head CT without acute findings.  Blood alcohol level less than 10.  IVC by EDP and  has safety sitter due to ongoing confusion, unable to make medical decisions for self and unsafe to sign out AMA.  No further seizures reported.  Had high CIWA scores 2 nights ago but has since improved and now down to 0 for the last 3 readings.  Continue CIWA protocol and close monitoring.  Seizure precautions.  Needs to quit alcohol-patient counseled extensively. Patient also counseled that he should not drive for 6 months and he verbalized understanding.  Hypothyroidism: May be noncompliant with medications.  TSH >10.  Started on levothyroxine 100 mcg daily.  Repeat TSH in 4 to 6 weeks.  Hypomagnesemia: Persistent. Despite replacing aggressively IV multiple times. Replaced with 4 g IV this morning and repeat magnesium is still at 1.3. Will replace again and follow in a.m.  Addendum: Checked with patient's RN, reportedly hasn't received IV magnesium 4 g written this morning and advised her to give it ASAP.  Hypokalemia/hyperkalemia: See discussion above. Mild intermittent hyperkalemia/5.2 > 5.3. Unclear etiology. Low potassium diet. Follow BMP in a.m.  Alcoholic liver disease: Transaminitis and bilirubin improving now that he has been off of alcohol. Improving.  Thrombocytopenia: Secondary to alcoholism and liver disease.  Stable.  No bleeding reported. Improving.  Anemia: May be due to chronic disease versus bone marrow toxic effect of alcohol.  Stable.  IVC status: Psychiatry consulted yesterday but did not see any input and hence reordered psychiatric consultation today. Mental status is improving and appears that he may be able to make medical decisions for himself. Await psychiatry input.  Body mass index is 19.42 kg/m.  Nutritional Status  Nutrition Problem: Moderate Malnutrition Etiology: social / environmental circumstances (EtOH abuse) Signs/Symptoms: moderate fat depletion, moderate muscle depletion, severe muscle depletion Interventions: Refer to RD note for  recommendations  DVT prophylaxis: SCDs Code Status: Full Family Communication: None at bedside Disposition:  Status is: Inpatient  Remains inpatient appropriate because:Persistent severe electrolyte disturbances   Dispo: The patient is from: Home              Anticipated d/c is to: Home              Anticipated d/c date is: 3 days              Patient currently is not medically stable to d/c.        Consultants:   Psychiatry  Procedures:   None  Antimicrobials:   None   Subjective:  Seems more coherent today. Alert and oriented x2. States that he was supposed to start a new job. Married. Reports that he drinks both beer's and liquor but lately was trying to cut back. Safety sitter at bedside.  Objective:   Vitals:   02/18/20 0737 02/18/20 1552 02/18/20 2007 02/19/20 0558  BP: 104/82 101/71 101/72 103/78  Pulse: 74 94 (!) 108 94  Resp: 18 18 16 14   Temp: 98.2 F (36.8 C) 98.3 F (36.8 C) 98.5 F (36.9 C) 98.6 F (37 C)  TempSrc: Oral Oral Oral Oral  SpO2: 100% 100% 100% 98%  Weight:      Height:        General exam: Pleasant young male, moderately built and nourished lying comfortably propped up in bed. Looks better compared to yesterday. Sitting up in bed eating something. Respiratory system: Clear to auscultation. Respiratory effort normal. Cardiovascular system: S1 & S2 heard, RRR. No JVD, murmurs, rubs, gallops or clicks. No pedal edema.   Gastrointestinal system: Abdomen is nondistended, soft and nontender. No organomegaly or masses felt. Normal bowel sounds heard. Central nervous system: Alert and oriented x2. No focal neurological deficits. Able to have a decent conversation.  No asterixis. Extremities: Symmetric 5 x 5 power. Skin: No rashes, lesions or ulcers Psychiatry: Judgement and insight improved. Mood & affect appropriate.     Data Reviewed:   I have personally reviewed following labs and imaging studies   CBC: Recent Labs  Lab  02-21-20 0445 02/17/20 0338 02/19/20 0453  WBC 5.8 5.0 6.6  NEUTROABS  --  2.9  --   HGB 9.9* 9.2* 9.8*  HCT 27.0* 25.5* 27.1*  MCV 98.9 99.6 99.3  PLT 46* 54* 93*    Basic Metabolic Panel: Recent Labs  Lab 02/15/20 2040 02/21/20 0445 02/17/20 0338 02/17/20 0338 02/18/20 0404 02/18/20 0404 02/18/20 1217 02/19/20 0453 02/19/20 1542  NA  --    < > 137   < > 135   < > 132* 135 134*  K  --    < > 3.6   < > 5.5*   < > 4.2 5.2* 5.3*  CL  --    < > 103   < > 100   < > 98 98 98  CO2  --    < > 25   < > 28   < > 28 28 27   GLUCOSE  --    < > 92   < > 112*   < > 117* 112* 122*  BUN  --    < > 8   < > 9   < > 8 12 12   CREATININE  --    < >  0.55*   < > 0.73   < > 0.72 0.78 0.85  CALCIUM  --    < > 7.5*   < > 8.7*   < > 8.5* 9.3 9.4  MG 0.8*   < > 1.5*   < > 1.3*  --   --  1.3* 1.3*  PHOS 2.0*  --  2.5  --  4.4  --   --   --   --    < > = values in this interval not displayed.    Liver Function Tests: Recent Labs  Lab 02/17/20 0338 02/18/20 0404 02/19/20 0453  AST 146* 103* 65*  ALT 43 41 37  ALKPHOS 213* 223* 208*  BILITOT 3.1* 2.0* 1.8*  PROT 5.7* 6.1* 6.2*  ALBUMIN 3.1* 3.2* 3.2*    CBG: No results for input(s): GLUCAP in the last 168 hours.  Microbiology Studies:   Recent Results (from the past 240 hour(s))  SARS Coronavirus 2 by RT PCR (hospital order, [facility]) Nasopharyngeal Nasopharyngeal Swab     Status: None   Collection Time: 02/15/20 11:01 PM   Specimen: Nasopharyngeal Swab  Result Value Ref Range Status   SARS Coronavirus 2 NEGATIVE NEGATIVE Final    Comment: (NOTE) SARS-CoV-2 target nucleic acids are NOT DETECTED.  The SARS-CoV-2 RNA is generally detectable in upper and lower respiratory specimens during the acute phase of infection. The lowest concentration of SARS-CoV-2 viral copies this assay can detect is 250 copies / mL. A negative result does not preclude SARS-CoV-2 infection and should not be used as the sole  basis for treatment or other patient management decisions.  A negative result may occur with improper specimen collection / handling, submission of specimen other than nasopharyngeal swab, presence of viral mutation(s) within the areas targeted by this assay, and inadequate number of viral copies (<250 copies / mL). A negative result must be combined with clinical observations, patient history, and epidemiological information.  Fact Sheet for Patients:   BoilerBrush.com.cyhttps://www.fda.gov/media/136312/download  Fact Sheet for Healthcare Providers: https://pope.com/https://www.fda.gov/media/136313/download  This test is not yet approved or  cleared by the [place] FDA and has been authorized for detection and/or diagnosis of SARS-CoV-2 by FDA under an Emergency Use Authorization (EUA).  This EUA will remain in effect (meaning this test can be used) for the duration of the COVID-19 declaration under Section 564(b)(1) of the Act, 21 U.S.C. section 360bbb-3(b)(1), unless the authorization is terminated or revoked sooner.  Performed at [facility], [address] [place], [place] [address]      Radiology Studies:  No results found.   Scheduled Meds:   . feeding supplement (ENSURE ENLIVE)  237 mL Oral BID BM  . folic acid  1 mg Oral Daily  . levothyroxine  100 mcg Oral Q0600  . LORazepam  0-4 mg Intravenous Q12H  . multivitamin with minerals  1 tablet Oral Daily  . nicotine  21 mg Transdermal Daily  . thiamine  100 mg Intravenous Daily  . thiamine  100 mg Oral Daily    Continuous Infusions:   . magnesium sulfate bolus IVPB       LOS: 4 days     Marcellus ScottAnand Carlin Attridge, MD, [place]ACP, [facility]. [facility]    To contact the attending provider between 7A-7P or the covering provider during after hours 7P-7A, please log into the web site [email or web address] and access using universal [place] password for that web site. If you do not have the password, please  call the hospital  operator.  02/19/2020, 6:09 PM

## 2020-02-19 NOTE — Progress Notes (Signed)
Ch visited with Pt as part of routine rounding. Ch checked in on Pt. Pt shared his struggles with alcohol. Pt requested prayer that he will make good choices for a better future. Pt shared that he is taken care of so well here that he almost does not want to leave.

## 2020-02-20 LAB — CBC
HCT: 29.4 % — ABNORMAL LOW (ref 39.0–52.0)
Hemoglobin: 10.2 g/dL — ABNORMAL LOW (ref 13.0–17.0)
MCH: 35.7 pg — ABNORMAL HIGH (ref 26.0–34.0)
MCHC: 34.7 g/dL (ref 30.0–36.0)
MCV: 102.8 fL — ABNORMAL HIGH (ref 80.0–100.0)
Platelets: 126 10*3/uL — ABNORMAL LOW (ref 150–400)
RBC: 2.86 MIL/uL — ABNORMAL LOW (ref 4.22–5.81)
RDW: 15.6 % — ABNORMAL HIGH (ref 11.5–15.5)
WBC: 7.3 10*3/uL (ref 4.0–10.5)
nRBC: 0 % (ref 0.0–0.2)

## 2020-02-20 LAB — COMPREHENSIVE METABOLIC PANEL
ALT: 35 U/L (ref 0–44)
AST: 58 U/L — ABNORMAL HIGH (ref 15–41)
Albumin: 3.3 g/dL — ABNORMAL LOW (ref 3.5–5.0)
Alkaline Phosphatase: 165 U/L — ABNORMAL HIGH (ref 38–126)
Anion gap: 12 (ref 5–15)
BUN: 14 mg/dL (ref 6–20)
CO2: 24 mmol/L (ref 22–32)
Calcium: 9.1 mg/dL (ref 8.9–10.3)
Chloride: 95 mmol/L — ABNORMAL LOW (ref 98–111)
Creatinine, Ser: 0.69 mg/dL (ref 0.61–1.24)
GFR calc Af Amer: >60 mL/min (ref >60)
GFR calc non Af Amer: >60 mL/min (ref >60)
Glucose, Bld: 111 mg/dL — ABNORMAL HIGH (ref 70–99)
Potassium: 5 mmol/L (ref 3.5–5.1)
Sodium: 131 mmol/L — ABNORMAL LOW (ref 135–145)
Total Bilirubin: 1.7 mg/dL — ABNORMAL HIGH (ref 0.3–1.2)
Total Protein: 6.6 g/dL (ref 6.5–8.1)

## 2020-02-20 LAB — MAGNESIUM
Magnesium: 1.4 mg/dL — ABNORMAL LOW (ref 1.7–2.4)
Magnesium: 2.6 mg/dL — ABNORMAL HIGH (ref 1.7–2.4)

## 2020-02-20 MED ORDER — FOLIC ACID 1 MG PO TABS: 1.0000 mg | ORAL_TABLET | Freq: Every day | ORAL | 0 refills | Status: DC

## 2020-02-20 MED ORDER — ADULT MULTIVITAMIN W/MINERALS CH: 1.0000 | ORAL_TABLET | Freq: Every day | ORAL | Status: DC

## 2020-02-20 MED ORDER — OLANZAPINE 5 MG PO TBDP
2.5000 mg | ORAL_TABLET | Freq: Every day | ORAL | Status: DC
  Filled 2020-02-20: qty 0.5

## 2020-02-20 MED ORDER — ENSURE ENLIVE PO LIQD: 237.0000 mL | Freq: Two times a day (BID) | ORAL | 12 refills | Status: DC

## 2020-02-20 MED ORDER — THIAMINE HCL 100 MG PO TABS: 100.0000 mg | ORAL_TABLET | Freq: Every day | ORAL | 0 refills | Status: DC

## 2020-02-20 MED ORDER — MAGNESIUM SULFATE 4 GM/100ML IV SOLN
4.0000 g | Freq: Once | INTRAVENOUS | Status: AC
  Administered 2020-02-20: 4 g via INTRAVENOUS
  Filled 2020-02-20: qty 100

## 2020-02-20 MED ORDER — OLANZAPINE 5 MG PO TBDP: 2.5000 mg | ORAL_TABLET | Freq: Every day | ORAL | 0 refills | Status: DC

## 2020-02-20 MED ORDER — NICOTINE 21 MG/24HR TD PT24: 21.0000 mg | MEDICATED_PATCH | Freq: Every day | TRANSDERMAL | 0 refills | Status: DC

## 2020-02-20 MED ORDER — LEVOTHYROXINE SODIUM 100 MCG PO TABS
100.0000 ug | ORAL_TABLET | Freq: Every day | ORAL | 0 refills | Status: DC
Start: 2020-02-20 — End: 2021-01-17

## 2020-02-20 NOTE — Progress Notes (Signed)
PT Cancellation Note  Patient Details Name: Joshua Medina MRN: [id number] DOB: Nov 27, 1985   Cancelled Treatment:     PT attempt. Pt requested holding PT until after lunch. Therapist will return later this date.    Rushie Chestnut 02/20/2020, 12:30 PM

## 2020-02-20 NOTE — TOC Progression Note (Signed)
Transition of Care [facility]) - Progression Note    Patient Details  Name: Joshua Medina MRN: [id number] Date of Birth: [birth year]  [facility] of [facility]) CM/SW Contact  Barrie Dunker, RN Phone Number: 02/20/2020, 4:16 PM  Clinical Narrative:    The patient's Michaele Offer made me aware that the patient does want a RW and a 3 in 1 to be delivered to the home they do not have insurance and they want it thru [facility], I verified with the patient that he is agreeable, He agreed, I called Zack with [employer] to arrange   Expected Discharge Plan: Home/Self Care Barriers to Discharge: Barriers Resolved  Expected Discharge Plan and Services Expected Discharge Plan: Home/Self Care In-house Referral: PCP / [facility] Discharge Planning Services: CM Consult   Living arrangements for the past 2 months: Single Family Home Expected Discharge Date: 02/20/20               DME Arranged: Patient refused services         HH Arranged: Patient Refused HH           Social Determinants of Health (SDOH) Interventions    Readmission Risk Interventions No flowsheet data found.

## 2020-02-20 NOTE — Progress Notes (Signed)
Discharge Note: Reviewed discharge instructions with pt. Pt verbalized understanding. Pt's wife brought clothing for patient. Obtained vitals. Removed IV cath, IV cath intact. Pt transported to home via private vehicle.

## 2020-02-20 NOTE — Consult Note (Signed)
[facility] Face-to-Face Psychiatry Consult   Reason for Consult:  Alcohol issues, MS issues in general  Referring Physician:  IM team  Patient Identification: Carthel Castille MRN:  [id number] Principal Diagnosis: Alcohol withdrawal seizure with complication, with unspecified complication (HCC) Diagnosis:  Principal Problem:   Alcohol withdrawal seizure with complication, with delirium (HCC) Active Problems:   Alcohol use disorder, moderate, dependence (HCC)   Hypothyroidism (acquired)   Alcoholic liver disease (HCC)   Alcohol withdrawal seizure, with delirium (HCC)   Thrombocytopenia (HCC)   Anemia   Hypokalemia   Hypomagnesemia   Alcohol withdrawal delirium (HCC)   Malnutrition of moderate degree   Total Time spent with patient:   30 -40 Min    Subjective:   Nazire Fruth is a 34 y.o. male patient admitted with  Long standing ETOH dependence, ETOH withdrawal, metabolic complications. Marland Kitchen  HPI: Vague historian,  However has had  Long standing ETOH use and dependence.  Recently came to Medical attention due to severe sequelae including metabolic abnormalities, seizures and   Past Psychiatric History:  He has no formal psych admits and does not have ongoing connection to ETOH rehab   He denies major depression, bipolar disorder and other related issues, no other substance issues noted he says --poor insight into his ETOh drinking and related matters but now says he wants to go to Day treatment.  Really needs 30-60 -90 day or more, but he is too resistant.  He does not meet IVC criteria, so will allow to expire.  He does not have risk to Self or others at this time.  Unclear what his compliance will be due to poor coping and social skills .  Risk to Self:  none at this time, past fall under influence of ETOH or withdrawal seizures  Risk to Others:  none  Prior Inpatient Therapy:  none regular or recent  Prior Outpatient Therapy:  No major follow up day treatment, IOP;  therapy, telemed   Past  Medical History:  Past Medical History:  Diagnosis Date  . Graves disease   . Thyroid disease     Past Surgical History:  Procedure Laterality Date  . thyroid ablation with radioactive I-131     Family History:  Family History  Problem Relation Age of Onset  . Diabetes Paternal Grandmother    Family Psychiatric  History:  His paternal grandarents, dad and uncles with ETOh dependence   Social History:  Works on and off in Airline pilot and related ---he says  Lives with wife at this time  Left message for her  Social History   Substance and Sexual Activity  Alcohol Use Yes     Social History   Substance and Sexual Activity  Drug Use Not on file    Social History   Socioeconomic History  . Marital status: Single    Spouse name: Not on file  . Number of children: Not on file  . Years of education: Not on file  . Highest education level: Not on file  Occupational History  . Not on file  Tobacco Use  . Smoking status: Current Every Day Smoker  . Smokeless tobacco: Never Used  Substance and Sexual Activity  . Alcohol use: Yes  . Drug use: Not on file  . Sexual activity: Not on file  Other Topics Concern  . Not on file  Social History Narrative  . Not on file   Social Determinants of Health   Financial Resource Strain:   . Difficulty of  Paying Living Expenses:   Food Insecurity:   . Worried About Programme researcher, broadcasting/film/video in the Last Year:   . Barista in the Last Year:   Transportation Needs:   . Freight forwarder (Medical):   Marland Kitchen Lack of Transportation (Non-Medical):   Physical Activity:   . Days of Exercise per Week:   . Minutes of Exercise per Session:   Stress:   . Feeling of Stress :   Social Connections:   . Frequency of Communication with Friends and Family:   . Frequency of Social Gatherings with Friends and Family:   . Attends Religious Services:   . Active Member of Clubs or Organizations:   . Attends Banker Meetings:   Marland Kitchen Marital  Status:    Additional Social History:    Allergies:  No Known Allergies  Labs:  Results for orders placed or performed during the hospital encounter of 02/15/20 (from the past 48 hour(s))  Basic metabolic panel     Status: Abnormal   Collection Time: 02/18/20 12:17 PM  Result Value Ref Range   Sodium 132 (L) 135 - 145 mmol/L   Potassium 4.2 3.5 - 5.1 mmol/L   Chloride 98 98 - 111 mmol/L   CO2 28 22 - 32 mmol/L   Glucose, Bld 117 (H) 70 - 99 mg/dL    Comment: Glucose reference range applies only to samples taken after fasting for at least 8 hours.   BUN 8 6 - 20 mg/dL   Creatinine, Ser 1.61 0.61 - 1.24 mg/dL   Calcium 8.5 (L) 8.9 - 10.3 mg/dL   GFR calc non Af Amer >60 >60 mL/min   GFR calc Af Amer >60 >60 mL/min   Anion gap 6 5 - 15    Comment: Performed at [facility], [address] [place], [place] [address]  Magnesium     Status: Abnormal   Collection Time: 02/19/20  4:53 AM  Result Value Ref Range   Magnesium 1.3 (L) 1.7 - 2.4 mg/dL    Comment: Performed at [facility], [address] [place], [place] [address]  Comprehensive metabolic panel     Status: Abnormal   Collection Time: 02/19/20  4:53 AM  Result Value Ref Range   Sodium 135 135 - 145 mmol/L   Potassium 5.2 (H) 3.5 - 5.1 mmol/L   Chloride 98 98 - 111 mmol/L   CO2 28 22 - 32 mmol/L   Glucose, Bld 112 (H) 70 - 99 mg/dL    Comment: Glucose reference range applies only to samples taken after fasting for at least 8 hours.   BUN 12 6 - 20 mg/dL   Creatinine, Ser 1.19 0.61 - 1.24 mg/dL   Calcium 9.3 8.9 - 14.7 mg/dL   Total Protein 6.2 (L) 6.5 - 8.1 g/dL   Albumin 3.2 (L) 3.5 - 5.0 g/dL   AST 65 (H) 15 - 41 U/L   ALT 37 0 - 44 U/L   Alkaline Phosphatase 208 (H) 38 - 126 U/L   Total Bilirubin 1.8 (H) 0.3 - 1.2 mg/dL   GFR calc non Af Amer >60 >60 mL/min   GFR calc Af Amer >60 >60 mL/min   Anion gap 9 5 - 15    Comment: Performed at [facility], [address]  [place], [place] [address]  CBC     Status: Abnormal   Collection Time: 02/19/20  4:53 AM  Result Value Ref Range   WBC  6.6 4.0 - 10.5 K/uL   RBC 2.73 (L) 4.22 - 5.81 MIL/uL   Hemoglobin 9.8 (L) 13.0 - 17.0 g/dL   HCT 16.127.1 (L) 39 - 52 %   MCV 99.3 80.0 - 100.0 fL   MCH 35.9 (H) 26.0 - 34.0 pg   MCHC 36.2 (H) 30.0 - 36.0 g/dL   RDW 09.615.6 (H) 04.511.5 - 40.915.5 %   Platelets 93 (L) 150 - 400 K/uL    Comment: CONSISTENT WITH PREVIOUS RESULT Immature Platelet Fraction may be clinically indicated, consider ordering this additional test [id number]    nRBC 0.3 (H) 0.0 - 0.2 %    Comment: Performed at [facility], [address] [place], [place] [address]  Basic metabolic panel     Status: Abnormal   Collection Time: 02/19/20  3:42 PM  Result Value Ref Range   Sodium 134 (L) 135 - 145 mmol/L   Potassium 5.3 (H) 3.5 - 5.1 mmol/L   Chloride 98 98 - 111 mmol/L   CO2 27 22 - 32 mmol/L   Glucose, Bld 122 (H) 70 - 99 mg/dL    Comment: Glucose reference range applies only to samples taken after fasting for at least 8 hours.   BUN 12 6 - 20 mg/dL   Creatinine, Ser 1.300.85 0.61 - 1.24 mg/dL   Calcium 9.4 8.9 - 86.510.3 mg/dL   GFR calc non Af Amer >60 >60 mL/min   GFR calc Af Amer >60 >60 mL/min   Anion gap 9 5 - 15    Comment: Performed at [facility], [address] [place], [place] [address]  Magnesium     Status: Abnormal   Collection Time: 02/19/20  3:42 PM  Result Value Ref Range   Magnesium 1.3 (L) 1.7 - 2.4 mg/dL    Comment: Performed at [facility], [address] [place], [place] [address]  Comprehensive metabolic panel     Status: Abnormal   Collection Time: 02/20/20  7:22 AM  Result Value Ref Range   Sodium 131 (L) 135 - 145 mmol/L   Potassium 5.0 3.5 - 5.1 mmol/L   Chloride 95 (L) 98 - 111 mmol/L   CO2 24 22 - 32 mmol/L   Glucose, Bld 111 (H) 70 - 99 mg/dL    Comment: Glucose reference range applies only to samples taken after fasting for at least 8 hours.    BUN 14 6 - 20 mg/dL   Creatinine, Ser 8.410.69 0.61 - 1.24 mg/dL   Calcium 9.1 8.9 - 32.410.3 mg/dL   Total Protein 6.6 6.5 - 8.1 g/dL   Albumin 3.3 (L) 3.5 - 5.0 g/dL   AST 58 (H) 15 - 41 U/L   ALT 35 0 - 44 U/L   Alkaline Phosphatase 165 (H) 38 - 126 U/L   Total Bilirubin 1.7 (H) 0.3 - 1.2 mg/dL   GFR calc non Af Amer >60 >60 mL/min   GFR calc Af Amer >60 >60 mL/min   Anion gap 12 5 - 15    Comment: Performed at [facility], [address] [place], [place] [address]  Magnesium     Status: Abnormal   Collection Time: 02/20/20  7:22 AM  Result Value Ref Range   Magnesium 1.4 (L) 1.7 - 2.4 mg/dL    Comment: Performed at [facility], [address] [place], [place] [address]  CBC     Status: Abnormal   Collection Time: 02/20/20  7:22 AM  Result Value Ref Range  WBC 7.3 4.0 - 10.5 K/uL   RBC 2.86 (L) 4.22 - 5.81 MIL/uL   Hemoglobin 10.2 (L) 13.0 - 17.0 g/dL   HCT 09.9 (L) 39 - 52 %   MCV 102.8 (H) 80.0 - 100.0 fL   MCH 35.7 (H) 26.0 - 34.0 pg   MCHC 34.7 30.0 - 36.0 g/dL   RDW 83.3 (H) 82.5 - 05.3 %   Platelets 126 (L) 150 - 400 K/uL    Comment: Immature Platelet Fraction may be clinically indicated, consider ordering this additional test [id number]    nRBC 0.0 0.0 - 0.2 %    Comment: Performed at [facility], [address], [place], [place] [address]    Current Facility-Administered Medications  Medication Dose Route Frequency Provider Last Rate Last Admin  . feeding supplement (ENSURE ENLIVE) (ENSURE ENLIVE) liquid 237 mL  237 mL Oral BID BM Dorcas Carrow, MD   237 mL at 02/20/20 1000  . folic acid (FOLVITE) tablet 1 mg  1 mg Oral Daily Andris Baumann, MD   1 mg at 02/20/20 1000  . ibuprofen (ADVIL) tablet 400 mg  400 mg Oral Q6H PRN Andris Baumann, MD   400 mg at 02/20/20 0342  . levothyroxine (SYNTHROID) tablet 100 mcg  100 mcg Oral Q0600 Dorcas Carrow, MD   100 mcg at 02/20/20 0544  . magnesium sulfate IVPB 4 g 100 mL  4 g Intravenous  Once Marcellus Ruger D, MD 50 mL/hr at 02/20/20 1041 4 g at 02/20/20 1041  . multivitamin with minerals tablet 1 tablet  1 tablet Oral Daily Andris Baumann, MD   1 tablet at 02/20/20 0959  . nicotine (NICODERM CQ - dosed in mg/24 hours) patch 21 mg  21 mg Transdermal Daily Jimmye Norman, NP   21 mg at 02/20/20 1000  . OLANZapine zydis (ZYPREXA) disintegrating tablet 2.5 mg  2.5 mg Oral QHS Roselind Messier, MD      . ondansetron [facility]) tablet 4 mg  4 mg Oral Q6H PRN Andris Baumann, MD       Or  . ondansetron [facility]) injection 4 mg  4 mg Intravenous Q6H PRN Andris Baumann, MD      . thiamine (B-1) injection 100 mg  100 mg Intravenous Daily Emily Filbert, MD   100 mg at 02/20/20 0959  . thiamine tablet 100 mg  100 mg Oral Daily Andris Baumann, MD   100 mg at 02/19/20 1522    Musculoskeletal: Strength & Muscle Tone:  History of malnutrition  Gait & Station:  Not known  Patient leans: n/a   Psychiatric Specialty Exam: Physical Exam  Review of Systems  Blood pressure 93/66, pulse 80, temperature 98.7 F (37.1 C), temperature source Oral, resp. rate 16, height 5\' 10"  (1.778 m), weight 61.4 kg, SpO2 100 %.Body mass index is 19.42 kg/m.    Mental Status     Alert somewhat cooperative, resistant in general, oriented to person place and date  Consciousness not clouded or fluctuant Concentration and attention somewhat low  Speech somewhat low tone volume Appearance sickly looking haggard  No shakes and tremors or related movements Mood somewhat anxious depressed Affect blunted somewhat flat  Thought process --no LOA FOI or other psychotic or manic processes Thought content --preoccupied with many denial minimzation themes, not interested in longer term recovery no frank delusions illusions or hallucinations at this point Memory --remote intact some general immediate and recent also intact through general questions Judgement insight  reliability all poor Fund of  knowledge and intelligence average Rapport fair to poor --poor coping and social skills SI and HI --no active SI or Hi contracts for safety  Abstraction --no new change   Cognition impaired --with seizures and metabolic now clearing  Sleep patterns --he said are disrupted   ADL's he is regaining in general                                              Treatment Plan Summary: Caucasian male with poor insight into his drinking and issues.  He wants to go to day treatment so SW can refer however he would benefit from inpatient longer term but he has no scope for this.   He denies need for depression medications But seeks meds for sleep ---I added low dose Zyprexa for this, in hopes that it will also stabilize his mood   Can be referred to couple's therapy since his profound state of malnutrition, seizures and all warrants exploration on wife's dysfunction and role in his drinking or whether she is coping through programs like [facility]   Messsage left for her  IVC can expire ----and he can be discharged when medically stable   Disposition:  Can go Home Post medical support with day treatment referrals  Roselind Messier, MD 02/20/2020 12:13 PM

## 2020-02-20 NOTE — Progress Notes (Signed)
Physical Therapy Treatment Patient Details Name: Joshua Medina MRN: [id number] DOB: [birth year] Today's Date: 02/20/2020    History of Present Illness Pt is a 34 y.o. male presenting to hospital 6/27 with AMS (of note pt previously fell and hit his head and was in ED about 1 week prior); pt sleepy and tired; intermittent confusion; and reportedly had a period of shaking and became unresponsive for a short period of time.  PMH includes Grave's disease and thyroid disease.    PT Comments    Pt was supine in bed upon arriving with sitter at bedside. He agrees to PT session and OOB activity with minimal encouragement. Overall pt demonstrated much improved balance and safety throughout session. He ambulated 200 ft with pushing IV pole and was able to safely/ easily perform ascending/descending stairs with supervision only. Therapist recommends continued skilled PT at DC to assist pt with returning to PLOF with focus on balance and higher level exercises. He was in bed at conclusion of session with sitter present and bed alarm in place.     Follow Up Recommendations  Home health PT;Supervision/Assistance - 24 hour     Equipment Recommendations  Rolling walker with 5" wheels;3in1 (PT)    Recommendations for Other Services       Precautions / Restrictions Precautions Precautions: Fall Restrictions Weight Bearing Restrictions: No    Mobility  Bed Mobility Overal bed mobility: Modified Independent Bed Mobility: Supine to Sit;Sit to Supine     Supine to sit: Modified independent (Device/Increase time) Sit to supine: Modified independent (Device/Increase time)      Transfers Overall transfer level: Needs assistance Equipment used: None (IV pole) Transfers: Sit to/from Stand Sit to Stand: Supervision            Ambulation/Gait Ambulation/Gait assistance: Supervision Gait Distance (Feet): 200 Feet Assistive device: IV Pole Gait Pattern/deviations: Step-through pattern Gait  velocity: WNL   General Gait Details: pt was able to ambulate 200 ft without LOB .Much improved gait safety this date versus previously observed. therapist recommends pt use AD at home until he fully returns to PLOF   Stairs Stairs: Yes Stairs assistance: Supervision Stair Management: Two rails Number of Stairs: 4 General stair comments: no difficulty ascending/descending stairs   Wheelchair Mobility    Modified Rankin (Stroke Patients Only)       Balance                                            Cognition Arousal/Alertness: Awake/alert Behavior During Therapy: Flat affect Overall Cognitive Status: Within Functional Limits for tasks assessed                                 General Comments: Pt was supine in bed upon arriving. He agrees to PT session and is cooperative throughout. continues to present with flat affect but willing      Exercises      General Comments        Pertinent Vitals/Pain Pain Assessment: No/denies pain    Home Living                      Prior Function            PT Goals (current goals can now be found in the care plan section) Acute Rehab PT  Goals Patient Stated Goal: to improve walking and balance Progress towards PT goals: Progressing toward goals    Frequency    Min 2X/week      PT Plan Current plan remains appropriate    Co-evaluation              AM-PAC PT "6 Clicks" Mobility   Outcome Measure  Help needed turning from your back to your side while in a flat bed without using bedrails?: None Help needed moving from lying on your back to sitting on the side of a flat bed without using bedrails?: None Help needed moving to and from a bed to a chair (including a wheelchair)?: None Help needed standing up from a chair using your arms (e.g., wheelchair or bedside chair)?: None Help needed to walk in hospital room?: A Little Help needed climbing 3-5 steps with a railing? :  A Little 6 Click Score: 22    End of Session   Activity Tolerance: Patient tolerated treatment well Patient left: in bed;with call bell/phone within reach;Other (comment);with nursing/sitter in room Nurse Communication: Mobility status;Precautions PT Visit Diagnosis: Unsteadiness on feet (R26.81);Other abnormalities of gait and mobility (R26.89);Muscle weakness (generalized) (M62.81);History of falling (Z91.81)     Time: 1324-4010 PT Time Calculation (min) (ACUTE ONLY): 8 min  Charges:  $Gait Training: 8-22 mins                     Jetta Lout PTA 02/20/20, 2:10 PM

## 2020-02-20 NOTE — Discharge Summary (Signed)
Physician Discharge Summary  Joshua Medina ZOX:[id number] DOB: 11-28-1985  PCP: Patient, No Pcp Per  Admitted from: Home Discharged to: Home  Admit date: 02/15/2020 Discharge date: 02/20/2020  Recommendations for Outpatient Follow-up:    Follow-up Information    Family physician. Schedule an appointment as soon as possible for a visit in 1 week(s).   Why: To be seen with repeat labs (CBC, CMP & magnesium).               Home Health: PT Equipment/Devices: Rolling walker with 5 inch wheels and 3 and 1  Discharge Condition: Improved and stable. CODE STATUS: Full. Diet recommendation: Heart healthy diet.  Discharge Diagnoses:  Principal Problem:   Alcohol withdrawal seizure with complication, with delirium (HCC) Active Problems:   Alcohol use disorder, moderate, dependence (HCC)   Hypothyroidism (acquired)   Alcoholic liver disease (HCC)   Alcohol withdrawal seizure, with delirium (HCC)   Thrombocytopenia (HCC)   Anemia   Hypokalemia   Hypomagnesemia   Alcohol withdrawal delirium (HCC)   Malnutrition of moderate degree   Brief Summary: 34 year old male with PMH of iatrogenic hypothyroidism, alcohol dependence, per EMS reportedly fell and hit his head and seen in ED a week prior to this admission, since then has been tired and sleepy, presented to ED on 6/27 following a period of shaking, then became unresponsive for a short period of time and has had intermittent confusion.  Admitted for suspected severe alcohol withdrawal including seizure.  He was IVC'd in the ED by EDP.   ED course:  Work-up in the ER significant for abnormal LFTs with AST to ALT ratio 223: 54, bilirubin 4.7. Had hypokalemia of 2.8. Thrombocytopenia of 51,000 and anemia of 10.6. Mild coagulopathy with INR 1.3. EtOH level less than 10 CT head with no acute intracranial findings. EKG showed sinus tachycardia at 102.   Assessment & Plan:   Alcohol dependence/alcohol withdrawal delirium and  seizure: History of alcohol dependence, reportedly trying to cut back presented with suspected seizure.  Head CT without acute findings.  Blood alcohol level less than 10.  IVC'd by EDP and had safety sitter due to ongoing confusion, unable to make medical decisions for self and unsafe to sign out AMA.  No further seizures reported.    He was monitored closely.  Treated per CIWA protocol.  Course was complicated by some auditory and visual hallucinations with high CIWA scores.  He gradually improved.  His CIWA scores especially over the last 36 to 48 hours have been low/0.  No overt alcohol withdrawal seen.  He has been extensively counseled multiple times that he should abstain from alcohol.  Advised that it would be appropriate for him to go into an inpatient substance abuse center but he declined stating that he has been to one before.  Discussed with TOC team to provide him with outpatient substance abuse resources.  As discussed with his wife today, she indicates that they will be going to alcohol Anonymous place "across the street".  Continue folate, thiamine and multivitamins.  Patient has been counseled that he should not drive and other activity restrictions until cleared by physician during outpatient follow-up.  Spouse indicates that he does not have a license for years and does not drive.  His IVC expired and as per psychiatry input, does not need to be renewed and I agree.  As communicated with psychiatry, safety sitter also discontinued at discharge which is appropriate.  At this time patient has capacity to make medical decisions  for himself although believe that he has poor insight into his overall health issues.  Hypothyroidism: May be noncompliant with medications.  TSH >10.  Started on levothyroxine 100 mcg daily.  Repeat TSH in 4 to 6 weeks.  Hypomagnesemia:  This was a persistent problem and required multiple doses of IV magnesium sulfate.  Finally magnesium is up to 2.6 today.  Follow  magnesium closely as outpatient.  Hypokalemia/hyperkalemia: See discussion above. Mild intermittent hyperkalemia/5.2 > 5.3. Unclear etiology. Low potassium diet-this was discussed with patient and he was advised to abstain from colored fruits/vegetables and other items with high potassium.  He verbalized understanding.  Potassium has normalized.  Close outpatient follow-up with repeat BMP.  Alcoholic liver disease: Transaminitis and bilirubin improving now that he has been off of alcohol. Improving.  Thrombocytopenia: Secondary to alcoholism and liver disease.  Stable.  No bleeding reported. Improving.  Anemia: May be due to chronic disease versus bone marrow toxic effect of alcohol.  Stable.  IVC status: Psychiatry input appreciated.  They too indicate that patient has poor insight into his drinking problems.  They initiated patient on low-dose Zyprexa 2.5 mg at bedtime to help with insomnia and stabilize his mood.  Body mass index is 19.42 kg/m.  Nutritional Status Nutrition Problem: Moderate Malnutrition Etiology: social / environmental circumstances (EtOH abuse) Signs/Symptoms: moderate fat depletion, moderate muscle depletion, severe muscle depletion Interventions: Refer to RD note for recommendations     Consultants:   Psychiatry  Procedures:   None   Discharge Instructions  Discharge Instructions    Call MD for:   Complete by: As directed    Recurrent seizures or altered mental status or confusion.   Call MD for:  difficulty breathing, headache or visual disturbances   Complete by: As directed    Call MD for:  extreme fatigue   Complete by: As directed    Call MD for:  persistant dizziness or light-headedness   Complete by: As directed    Call MD for:  persistant nausea and vomiting   Complete by: As directed    Call MD for:  severe uncontrolled pain   Complete by: As directed    Call MD for:  temperature >100.4   Complete by: As directed    Diet - low  sodium heart healthy   Complete by: As directed    Discharge instructions   Complete by: As directed    Patient is unable to drive, operate heavy machinery, perform activities at heights or participate in water activities until release by outpatient physician. This was discussed with the patient who expressed understanding.   Driving Restrictions   Complete by: As directed    No driving until cleared by physician during outpatient follow-up.   Increase activity slowly   Complete by: As directed        Medication List    STOP taking these medications   chlordiazePOXIDE 25 MG capsule Commonly known as: LIBRIUM     TAKE these medications   feeding supplement (ENSURE ENLIVE) Liqd Take 237 mLs by mouth 2 (two) times daily between meals.   folic acid 1 MG tablet Commonly known as: FOLVITE Take 1 tablet (1 mg total) by mouth daily. Start taking on: February 21, 2020   levothyroxine 100 MCG tablet Commonly known as: Synthroid Take 1 tablet (100 mcg total) by mouth daily.   multivitamin with minerals Tabs tablet Take 1 tablet by mouth daily. Start taking on: February 21, 2020   nicotine 21 mg/24hr patch  Commonly known as: NICODERM CQ - dosed in mg/24 hours Place 1 patch (21 mg total) onto the skin daily. Start taking on: February 21, 2020   OLANZapine zydis 5 MG disintegrating tablet Commonly known as: ZYPREXA Take 0.5 tablets (2.5 mg total) by mouth at bedtime.   thiamine 100 MG tablet Take 1 tablet (100 mg total) by mouth daily. Start taking on: February 21, 2020      No Known Allergies    Procedures/Studies: CT Head Wo Contrast  Result Date: 02/15/2020 CLINICAL DATA:  Delirium.  Recent fall. EXAM: CT HEAD WITHOUT CONTRAST TECHNIQUE: Contiguous axial images were obtained from the base of the skull through the vertex without intravenous contrast. COMPARISON:  None. FINDINGS: Brain: There is no mass, hemorrhage or extra-axial collection. The size and configuration of the ventricles and  extra-axial CSF spaces are normal. The brain parenchyma is normal, without acute or chronic infarction. Vascular: No abnormal hyperdensity of the major intracranial arteries or dural venous sinuses. No intracranial atherosclerosis. Skull: The visualized skull base, calvarium and extracranial soft tissues are normal. Sinuses/Orbits: No fluid levels or advanced mucosal thickening of the visualized paranasal sinuses. No mastoid or middle ear effusion. The orbits are normal. IMPRESSION: Normal head CT. Electronically Signed   By: Deatra RobinsonKevin  Herman M.D.   On: 02/15/2020 20:32      Subjective: Overall feels tired but otherwise denies complaints.  Aware of why he was hospitalized.  Able to tell me that today.  Denies auditory or visual hallucinations.  No physical complaints reported.  As per sitter at bedside, has been having a coherent conversation today.  No suicidal or homicidal ideations reported.  As per RN, no acute issues noted.  Discharge Exam:  Vitals:   02/19/20 0558 02/20/20 0016 02/20/20 0332 02/20/20 0807  BP: 103/78 115/79 110/87 93/66  Pulse: 94 83 (!) 103 80  Resp: 14  18 16   Temp: 98.6 F (37 C) 99.1 F (37.3 C) 98.3 F (36.8 C) 98.7 F (37.1 C)  TempSrc: Oral Oral Oral Oral  SpO2: 98% 100% 100% 100%  Weight:      Height:        General exam: Pleasant young male, moderately built and nourished lying comfortably propped up in bed.  Has progressively improved over the last 72 hours. Respiratory system: Clear to auscultation. Respiratory effort normal. Cardiovascular system: S1 & S2 heard, RRR. No JVD, murmurs, rubs, gallops or clicks. No pedal edema.   Gastrointestinal system: Abdomen is nondistended, soft and nontender. No organomegaly or masses felt. Normal bowel sounds heard. Central nervous system: Alert and oriented x3. No focal neurological deficits. Able to have a decent conversation.  No asterixis. Extremities: Symmetric 5 x 5 power. Skin: No rashes, lesions or  ulcers Psychiatry: Judgement and insight improved. Mood & affect appropriate.     The results of significant diagnostics from this hospitalization (including imaging, microbiology, ancillary and laboratory) are listed below for reference.     Microbiology: Recent Results (from the past 240 hour(s))  SARS Coronavirus 2 by RT PCR (hospital order, [facility]) Nasopharyngeal Nasopharyngeal Swab     Status: None   Collection Time: 02/15/20 11:01 PM   Specimen: Nasopharyngeal Swab  Result Value Ref Range Status   SARS Coronavirus 2 NEGATIVE NEGATIVE Final    Comment: (NOTE) SARS-CoV-2 target nucleic acids are NOT DETECTED.  The SARS-CoV-2 RNA is generally detectable in upper and lower respiratory specimens during the acute phase of infection. The lowest concentration of  SARS-CoV-2 viral copies this assay can detect is 250 copies / mL. A negative result does not preclude SARS-CoV-2 infection and should not be used as the sole basis for treatment or other patient management decisions.  A negative result may occur with improper specimen collection / handling, submission of specimen other than nasopharyngeal swab, presence of viral mutation(s) within the areas targeted by this assay, and inadequate number of viral copies (<250 copies / mL). A negative result must be combined with clinical observations, patient history, and epidemiological information.  Fact Sheet for Patients:   BoilerBrush.com.cy  Fact Sheet for Healthcare Providers: https://pope.com/  This test is not yet approved or  cleared by the [place] FDA and has been authorized for detection and/or diagnosis of SARS-CoV-2 by FDA under an Emergency Use Authorization (EUA).  This EUA will remain in effect (meaning this test can be used) for the duration of the COVID-19 declaration under Section 564(b)(1) of the Act, 21 U.S.C. section 360bbb-3(b)(1),  unless the authorization is terminated or revoked sooner.  Performed at [facility], [address] [place], [place] [address]      Labs: CBC: Recent Labs  Lab 02/15/20 1750 02/16/20 0445 02/17/20 0338 02/19/20 0453 02/20/20 0722  WBC 6.3 5.8 5.0 6.6 7.3  NEUTROABS  --   --  2.9  --   --   HGB 10.6* 9.9* 9.2* 9.8* 10.2*  HCT 29.3* 27.0* 25.5* 27.1* 29.4*  MCV 98.7 98.9 99.6 99.3 102.8*  PLT 51* 46* 54* 93* 126*    Basic Metabolic Panel: Recent Labs  Lab 02/15/20 2040 02/16/20 0445 02/17/20 0338 02/17/20 0338 02/18/20 0404 02/18/20 1217 02/19/20 0453 02/19/20 1542 02/20/20 0722 02/20/20 1343  NA  --    < > 137   < > 135 132* 135 134* 131*  --   K  --    < > 3.6   < > 5.5* 4.2 5.2* 5.3* 5.0  --   CL  --    < > 103   < > 100 98 98 98 95*  --   CO2  --    < > 25   < > 28 28 28 27 24   --   GLUCOSE  --    < > 92   < > 112* 117* 112* 122* 111*  --   BUN  --    < > 8   < > 9 8 12 12 14   --   CREATININE  --    < > 0.55*   < > 0.73 0.72 0.78 0.85 0.69  --   CALCIUM  --    < > 7.5*   < > 8.7* 8.5* 9.3 9.4 9.1  --   MG 0.8*   < > 1.5*   < > 1.3*  --  1.3* 1.3* 1.4* 2.6*  PHOS 2.0*  --  2.5  --  4.4  --   --   --   --   --    < > = values in this interval not displayed.    Liver Function Tests: Recent Labs  Lab 02/16/20 0445 02/17/20 0338 02/18/20 0404 02/19/20 0453 02/20/20 0722  AST 189* 146* 103* 65* 58*  ALT 47* 43 41 37 35  ALKPHOS 214* 213* 223* 208* 165*  BILITOT 4.6* 3.1* 2.0* 1.8* 1.7*  PROT 6.0* 5.7* 6.1* 6.2* 6.6  ALBUMIN 3.3* 3.1* 3.2* 3.2* 3.3*     Urinalysis    Component Value Date/Time   COLORURINE  AMBER (A) 02/16/2020 0446   APPEARANCEUR HAZY (A) 02/16/2020 0446   LABSPEC 1.011 02/16/2020 0446   PHURINE 7.0 02/16/2020 0446   GLUCOSEU NEGATIVE 02/16/2020 0446   HGBUR NEGATIVE 02/16/2020 0446   BILIRUBINUR SMALL (A) 02/16/2020 0446   KETONESUR NEGATIVE 02/16/2020 0446   PROTEINUR 100 (A) 02/16/2020 0446   NITRITE NEGATIVE  02/16/2020 0446   LEUKOCYTESUR NEGATIVE 02/16/2020 0446    I discussed in detail with patient spouse, updated care and answered questions and she would be able to care for him and provide the 24/7 supervision and assistance.  Time coordinating discharge: 40 minutes  SIGNED:  Marcellus Ilija, MD, FACP, [facility]. [facility]  To contact the attending provider between 7A-7P or the covering provider during after hours 7P-7A, please log into the web site [email or web address] and access using universal [place] password for that web site. If you do not have the password, please call the hospital operator.

## 2020-02-20 NOTE — TOC Progression Note (Signed)
Transition of Care [facility]) - Progression Note    Patient Details  Name: Joshua Medina MRN: [id number] Date of Birth: 1986-04-10  [facility] CM/SW Gann, RN Phone Number: 02/20/2020, 3:17 PM  Clinical Narrative:    Met with the patient to discuss DC plan and needs, He lives at home with his wife, He will have transportation with her, He does not have a PCP, I provided him with [facility] and explained he needs to call to get appointment to fill out application and then they would set him up with an appointment, I provided him with Resources for ETOH and Substance abuse treatment and counseling, He stated that they have a cane and crutches at home and he does not want or need additional DME, He refuses [place] and said he does not need it, he stated that he has no additional needs        Expected Discharge Plan and Services           Expected Discharge Date: 02/20/20                                     Social Determinants of Health (SDOH) Interventions    Readmission Risk Interventions No flowsheet data found.

## 2020-02-20 NOTE — Discharge Instructions (Signed)

## 2020-02-24 ENCOUNTER — Telehealth: Payer: Self-pay | Admitting: General Practice

## 2020-02-24 NOTE — Telephone Encounter (Addendum)
Is interested in becoming a patient... should be coming this week to pick up our application packet  ----- Message from Benjamin Stain, CMA sent at 02/24/2020  9:33 AM EDT ----- Regarding: [facility] referral Referral from [facility]. Pt has been given application.  Please review eligibility requirements and explain [facility] new patient process.

## 2020-04-03 ENCOUNTER — Other Ambulatory Visit: Payer: Self-pay

## 2020-04-03 ENCOUNTER — Emergency Department: Payer: Self-pay

## 2020-04-03 ENCOUNTER — Emergency Department
Admission: EM | Admit: 2020-04-03 | Discharge: 2020-04-03 | Disposition: A | Discharge status: Home / Self Care | Payer: Self-pay | Attending: Emergency Medicine | Admitting: Emergency Medicine

## 2020-04-03 ENCOUNTER — Encounter: Payer: Self-pay | Admitting: Emergency Medicine

## 2020-04-03 DIAGNOSIS — R55 Syncope and collapse: Secondary | ICD-10-CM | POA: Insufficient documentation

## 2020-04-03 DIAGNOSIS — F1020 Alcohol dependence, uncomplicated: Secondary | ICD-10-CM | POA: Insufficient documentation

## 2020-04-03 DIAGNOSIS — R569 Unspecified convulsions: Secondary | ICD-10-CM | POA: Insufficient documentation

## 2020-04-03 DIAGNOSIS — E039 Hypothyroidism, unspecified: Secondary | ICD-10-CM | POA: Insufficient documentation

## 2020-04-03 DIAGNOSIS — Z79890 Hormone replacement therapy (postmenopausal): Secondary | ICD-10-CM | POA: Insufficient documentation

## 2020-04-03 DIAGNOSIS — R197 Diarrhea, unspecified: Secondary | ICD-10-CM | POA: Insufficient documentation

## 2020-04-03 DIAGNOSIS — Z5321 Procedure and treatment not carried out due to patient leaving prior to being seen by health care provider: Secondary | ICD-10-CM | POA: Insufficient documentation

## 2020-04-03 DIAGNOSIS — R1110 Vomiting, unspecified: Secondary | ICD-10-CM | POA: Insufficient documentation

## 2020-04-03 DIAGNOSIS — F17200 Nicotine dependence, unspecified, uncomplicated: Secondary | ICD-10-CM | POA: Insufficient documentation

## 2020-04-03 LAB — COMPREHENSIVE METABOLIC PANEL
ALT: 90 U/L — ABNORMAL HIGH (ref 0–44)
AST: 487 U/L — ABNORMAL HIGH (ref 15–41)
Albumin: 4 g/dL (ref 3.5–5.0)
Alkaline Phosphatase: 218 U/L — ABNORMAL HIGH (ref 38–126)
Anion gap: 18 — ABNORMAL HIGH (ref 5–15)
BUN: 6 mg/dL (ref 6–20)
CO2: 25 mmol/L (ref 22–32)
Calcium: 8.5 mg/dL — ABNORMAL LOW (ref 8.9–10.3)
Chloride: 97 mmol/L — ABNORMAL LOW (ref 98–111)
Creatinine, Ser: 0.84 mg/dL (ref 0.61–1.24)
GFR calc Af Amer: >60 mL/min (ref >60)
GFR calc non Af Amer: >60 mL/min (ref >60)
Glucose, Bld: 133 mg/dL — ABNORMAL HIGH (ref 70–99)
Potassium: 3.6 mmol/L (ref 3.5–5.1)
Sodium: 140 mmol/L (ref 135–145)
Total Bilirubin: 1.8 mg/dL — ABNORMAL HIGH (ref 0.3–1.2)
Total Protein: 7.4 g/dL (ref 6.5–8.1)

## 2020-04-03 LAB — CBC
HCT: 37.5 % — ABNORMAL LOW (ref 39.0–52.0)
Hemoglobin: 13.1 g/dL (ref 13.0–17.0)
MCH: 35.2 pg — ABNORMAL HIGH (ref 26.0–34.0)
MCHC: 34.9 g/dL (ref 30.0–36.0)
MCV: 100.8 fL — ABNORMAL HIGH (ref 80.0–100.0)
Platelets: 75 10*3/uL — ABNORMAL LOW (ref 150–400)
RBC: 3.72 MIL/uL — ABNORMAL LOW (ref 4.22–5.81)
RDW: 14.6 % (ref 11.5–15.5)
WBC: 6.6 10*3/uL (ref 4.0–10.5)
nRBC: 0 % (ref 0.0–0.2)

## 2020-04-03 LAB — LIPASE, BLOOD: Lipase: 133 U/L — ABNORMAL HIGH (ref 11–51)

## 2020-04-03 MED ORDER — LACTATED RINGERS IV BOLUS
1000.0000 mL | Freq: Once | INTRAVENOUS | Status: AC
  Administered 2020-04-03: 1000 mL via INTRAVENOUS

## 2020-04-03 MED ORDER — ADULT MULTIVITAMIN W/MINERALS CH
1.0000 | ORAL_TABLET | Freq: Once | ORAL | Status: AC
  Administered 2020-04-03: 1 via ORAL
  Filled 2020-04-03: qty 1

## 2020-04-03 MED ORDER — THIAMINE HCL 100 MG PO TABS
100.0000 mg | ORAL_TABLET | Freq: Once | ORAL | Status: AC
  Administered 2020-04-03: 100 mg via ORAL
  Filled 2020-04-03: qty 1

## 2020-04-03 MED ORDER — ONDANSETRON HCL 4 MG/2ML IJ SOLN
4.0000 mg | Freq: Once | INTRAMUSCULAR | Status: AC
  Administered 2020-04-03: 4 mg via INTRAVENOUS
  Filled 2020-04-03: qty 2

## 2020-04-03 NOTE — Discharge Instructions (Addendum)
Return to the ED with any acute illnesses or injuries.

## 2020-04-03 NOTE — ED Triage Notes (Signed)
Patient reports nausea, vomiting and diarrhea that started 2 days ago.

## 2020-04-03 NOTE — ED Triage Notes (Signed)
Pt LWBS earlier today while this RN on shift, pt then came back and was told he needed to stay by his wife. Pt reports initially came in for seizures and N/V. Pt remaisn A&O x4 and ambulatory.

## 2020-04-03 NOTE — ED Notes (Signed)
Pt states that his wife found pt convulsing last night. Pt denies history of seizures. Pt does report regular alcohol use and states that he did stop drinking prior to convulsion/seizure activity. Pt is AOX4, NAD noted. Pt given warm blanket.

## 2020-04-03 NOTE — ED Provider Notes (Signed)
[facility] Emergency Department Provider Note ____________________________________________   First MD Initiated Contact with Patient 04/03/20 1152     (approximate)  I have reviewed the triage vital signs and the nursing notes.  HISTORY  Chief Complaint Seizures, Nausea, and Emesis   HPI Joshua Medina is a 34 y.o. malewho presents to the ED for evaluation of possible seizure.  Chart review indicates history of alcohol use disorder with associated complications, including seizures with withdrawal, malnutrition and electrolyte disturbances.  Patient reports drinking at least 5 vodka/juice mixed drinks per day.  He reports it has been a matter of years since he has had a whole day without drinking alcohol.  Patient reports no desire to quit drinking at this time.  Denies suicidality, homicidality or hallucinations.  Last drink was last night, approximately 12 hours ago.  Patient reports getting up from his bed last night to use the bathroom, when he felt lightheaded and fell to the ground in his bathroom.  Patient reports falling to his right side, hitting there is right-sided head and "maybe passing out."  Patient reports that his wife found him like this in the bathroom after hearing him fall, reports that he was "convulsing a little bit."  She reports that he awoke without postictal state and he has had no subsequent episodes of syncope or seizure-like activity.  No tongue biting or incontinence.  Patient reports feeling "fine" here in the ED.  Denies headache, subsequent syncope, vomiting.  He reports feeling nauseous at this time.  Denies any pain at this time.  No recent illnesses.  He does report taking Synthroid, vitamins at home daily.   Past Medical History:  Diagnosis Date  . Graves disease   . Thyroid disease     Patient Active Problem List   Diagnosis Date Noted  . Malnutrition of moderate degree 02/17/2020  . Hypomagnesemia 02/16/2020  . Alcohol  withdrawal delirium (HCC) 02/16/2020  . Alcohol use disorder, moderate, dependence (HCC) 02/15/2020  . Hypothyroidism (acquired) 02/15/2020  . Alcoholic liver disease (HCC) 02/15/2020  . Alcohol withdrawal seizure with complication, with delirium (HCC) 02/15/2020  . Alcohol withdrawal seizure, with delirium (HCC) 02/15/2020  . Thrombocytopenia (HCC) 02/15/2020  . Anemia 02/15/2020  . Hypokalemia 02/15/2020    Past Surgical History:  Procedure Laterality Date  . thyroid ablation with radioactive I-131      Prior to Admission medications   Medication Sig Start Date End Date Taking? Authorizing Provider  feeding supplement, ENSURE ENLIVE, (ENSURE ENLIVE) LIQD Take 237 mLs by mouth 2 (two) times daily between meals. 02/20/20  Yes Hongalgi, Maximino Greenland, MD  folic acid (FOLVITE) 1 MG tablet Take 1 tablet (1 mg total) by mouth daily. 02/21/20  Yes Hongalgi, Maximino Greenland, MD  levothyroxine (SYNTHROID) 100 MCG tablet Take 1 tablet (100 mcg total) by mouth daily. 02/20/20 04/03/20 Yes Hongalgi, Maximino Greenland, MD  Multiple Vitamin (MULTIVITAMIN WITH MINERALS) TABS tablet Take 1 tablet by mouth daily. 02/21/20  Yes Hongalgi, Maximino Greenland, MD  nicotine (NICODERM CQ - DOSED IN MG/24 HOURS) 21 mg/24hr patch Place 1 patch (21 mg total) onto the skin daily. 02/21/20  Yes Hongalgi, Maximino Greenland, MD  OLANZapine zydis (ZYPREXA) 5 MG disintegrating tablet Take 0.5 tablets (2.5 mg total) by mouth at bedtime. 02/20/20  Yes Hongalgi, Maximino Greenland, MD  thiamine 100 MG tablet Take 1 tablet (100 mg total) by mouth daily. 02/21/20  Yes Hongalgi, Maximino Greenland, MD    Allergies Patient has no known allergies.  Family History  Problem Relation Age of Onset  . Diabetes Paternal Grandmother     Social History Social History   Tobacco Use  . Smoking status: Current Every Day Smoker  . Smokeless tobacco: Never Used  Substance Use Topics  . Alcohol use: Yes  . Drug use: Not on file    Review of Systems  Constitutional: No fever/chills Eyes: No visual  changes. ENT: No sore throat. Cardiovascular: Denies chest pain. Respiratory: Denies shortness of breath. Gastrointestinal: No abdominal pain.  no vomiting.  No diarrhea.  No constipation.  Positive for nausea. Genitourinary: Negative for dysuria. Musculoskeletal: Negative for back pain. Skin: Negative for rash. Neurological: Negative for headaches, focal weakness or numbness. ____________________________________________   PHYSICAL EXAM:  VITAL SIGNS: Vitals:   04/03/20 1200 04/03/20 1326  BP: (!) 118/94   Pulse: 91 75  Resp: 14 13  Temp:    SpO2: 100% 100%      Constitutional: Alert and oriented. Well appearing and in no acute distress. Eyes: Conjunctivae are normal. PERRL. EOMI. Head: Atraumatic. Nose: No congestion/rhinnorhea. Mouth/Throat: Mucous membranes are dry.  Oropharynx non-erythematous. Neck: No stridor. No cervical spine tenderness to palpation. Cardiovascular: Normal rate, regular rhythm. Grossly normal heart sounds.  Good peripheral circulation. Respiratory: Normal respiratory effort.  No retractions. Lungs CTAB. Gastrointestinal: Soft , nondistended, nontender to palpation. No abdominal bruits. No CVA tenderness. Musculoskeletal: No lower extremity tenderness nor edema.  No joint effusions. No signs of acute trauma. Full palpation of all 4 extremities without evidence of acute traumatic pathology or deformity. No spinal tenderness throughout. Neurologic:  Normal speech and language. No gross focal neurologic deficits are appreciated. No gait instability noted. Cranial nerves II through XII intact 5/5 strength and sensation in all 4 extremities Skin:  Skin is warm, dry and intact. No rash noted. Psychiatric: Mood and affect are normal. Speech and behavior are normal.  ____________________________________________   LABS (all labs ordered are listed, but only abnormal results are displayed)  Labs reviewed from this morning.  They are listed under a  separate encounter on 8/14, resulting at 0700. CBC shows macrocytic hemoglobin and mild thrombo-cytopenia Lipase marginally elevated at 130 CMP with slight worsening of his chronic liver disease: T bili 1.8 from 1.7. AST elevated to 480,  ALT 90 ____________________________________________  12 Lead EKG   ____________________________________________  RADIOLOGY  ED MD interpretation: CT head reviewed and without evidence of acute intracranial pathology.  Official radiology report(s): CT Head Wo Contrast  Result Date: 04/03/2020 CLINICAL DATA:  Patient status post fall. EXAM: CT HEAD WITHOUT CONTRAST TECHNIQUE: Contiguous axial images were obtained from the base of the skull through the vertex without intravenous contrast. COMPARISON:  Brain CT 02/15/2020. FINDINGS: Brain: Ventricles and sulci are appropriate for patient's age. No evidence for acute cortically based infarct, intracranial hemorrhage, mass lesion or mass-effect. Vascular: Unremarkable. Skull: Intact. Sinuses/Orbits: Paranasal sinuses are well aerated. Mastoid air cells are unremarkable. Other: None. IMPRESSION: No acute intracranial process. Electronically Signed   By: Annia Belt M.D.   On: 04/03/2020 13:14   ____________________________________________   PROCEDURES and INTERVENTIONS  Procedure(s) performed (including Critical Care):  Procedures  Medications  lactated ringers bolus 1,000 mL (1,000 mLs Intravenous New Bag/Given 04/03/20 1218)  multivitamin with minerals tablet 1 tablet (1 tablet Oral Given 04/03/20 1218)  thiamine tablet 100 mg (100 mg Oral Given 04/03/20 1218)  ondansetron (ZOFRAN) injection 4 mg (4 mg Intravenous Given 04/03/20 1219)    ____________________________________________   INITIAL IMPRESSION / ASSESSMENT AND  PLAN / ED COURSE  34 year old alcoholic presents for evaluation after episode of fall and syncope overnight while getting up to use the restroom, most consistent with a vasovagal  episode, and amenable to outpatient management.  Normal vital signs on room air.  Examination without evidence of significant acute derangements.  No evidence of traumatic pathology, distress or other acute derangements.  Blood work reviewed and demonstrates progression of alcoholic liver disease.  Patient reports it has been a matter of years since he can recall a day without having any alcoholic drinks.  I recommended the patient seek help for his alcoholism, but he reports not being ready and has no desire for treatment at this time.  CT imaging of his head obtained due to patient being an unreliable historian, and slightly higher risk for coagulopathy in the setting of his alcoholic liver disease, and without evidence of acute cranial pathology.  He has no evidence of additional trauma on exam to warrant imaging of his extremities or thorax.  Provided patient with LR, vitamins and solid food, which he tolerated well.  I advised multiple times that he seek treatment for his alcoholism, he expresses understanding, but does not agree to seek treatment at this time.  I explained return precautions for the ED, for which she expressed understanding and agreement.  Patient medically stable for discharge home.  Clinical Course as of Apr 03 1333  Sat Apr 03, 2020  1229 CT technician approaches main with concerned that patient is refusing CT head.  I go to the bedside and educate the patient my recommendation for CT imaging, and this will facilitate his possible discharge.  He is now agreeable.   [DS]  1333 Educated patient on reassuring work-up.  Further advised patient my recommendation for cessation.  We discussed coping mechanisms.  He continues to report that he is not ready to quit drinking alcohol.   [DS]    Clinical Course User Index [DS] Delton Prairie, MD     ____________________________________________   FINAL CLINICAL IMPRESSION(S) / ED DIAGNOSES  Final diagnoses:  Alcoholism (HCC)    Vasovagal episode     ED Discharge Orders    None       Catcher Dehoyos Katrinka Blazing   Note:  This document was prepared using Dragon voice recognition software and may include unintentional dictation errors.   Delton Prairie, MD 04/03/20 1339

## 2020-04-03 NOTE — ED Triage Notes (Signed)
First Nurse: patient brought in by ems from home. Patient stated to ems that he has been drinking tonight and that he is just not feeling well. Per ems vital signs stable.

## 2020-06-04 ENCOUNTER — Emergency Department: Payer: Self-pay

## 2020-06-04 ENCOUNTER — Inpatient Hospital Stay: Payer: Self-pay

## 2020-06-04 ENCOUNTER — Inpatient Hospital Stay
Admission: EM | Admit: 2020-06-04 | Discharge: 2020-06-11 | DRG: 896 | Disposition: A | Discharge status: Home / Self Care | Payer: Self-pay | Attending: Internal Medicine | Admitting: Internal Medicine

## 2020-06-04 ENCOUNTER — Other Ambulatory Visit: Payer: Self-pay

## 2020-06-04 DIAGNOSIS — Z9119 Patient's noncompliance with other medical treatment and regimen: Secondary | ICD-10-CM

## 2020-06-04 DIAGNOSIS — K760 Fatty (change of) liver, not elsewhere classified: Secondary | ICD-10-CM | POA: Diagnosis present

## 2020-06-04 DIAGNOSIS — D539 Nutritional anemia, unspecified: Secondary | ICD-10-CM | POA: Diagnosis present

## 2020-06-04 DIAGNOSIS — R579 Shock, unspecified: Secondary | ICD-10-CM | POA: Diagnosis present

## 2020-06-04 DIAGNOSIS — F10931 Alcohol use, unspecified with withdrawal delirium: Secondary | ICD-10-CM

## 2020-06-04 DIAGNOSIS — E872 Acidosis: Secondary | ICD-10-CM | POA: Diagnosis present

## 2020-06-04 DIAGNOSIS — E162 Hypoglycemia, unspecified: Secondary | ICD-10-CM

## 2020-06-04 DIAGNOSIS — Z79899 Other long term (current) drug therapy: Secondary | ICD-10-CM

## 2020-06-04 DIAGNOSIS — F10231 Alcohol dependence with withdrawal delirium: Principal | ICD-10-CM | POA: Diagnosis present

## 2020-06-04 DIAGNOSIS — F10230 Alcohol dependence with withdrawal, uncomplicated: Secondary | ICD-10-CM

## 2020-06-04 DIAGNOSIS — R569 Unspecified convulsions: Secondary | ICD-10-CM

## 2020-06-04 DIAGNOSIS — E43 Unspecified severe protein-calorie malnutrition: Secondary | ICD-10-CM | POA: Diagnosis present

## 2020-06-04 DIAGNOSIS — E0500 Thyrotoxicosis with diffuse goiter without thyrotoxic crisis or storm: Secondary | ICD-10-CM | POA: Diagnosis present

## 2020-06-04 DIAGNOSIS — E876 Hypokalemia: Secondary | ICD-10-CM | POA: Diagnosis present

## 2020-06-04 DIAGNOSIS — D6959 Other secondary thrombocytopenia: Secondary | ICD-10-CM | POA: Diagnosis present

## 2020-06-04 DIAGNOSIS — Z833 Family history of diabetes mellitus: Secondary | ICD-10-CM

## 2020-06-04 DIAGNOSIS — Z9114 Patient's other noncompliance with medication regimen: Secondary | ICD-10-CM

## 2020-06-04 DIAGNOSIS — R443 Hallucinations, unspecified: Secondary | ICD-10-CM | POA: Diagnosis present

## 2020-06-04 DIAGNOSIS — I959 Hypotension, unspecified: Secondary | ICD-10-CM

## 2020-06-04 DIAGNOSIS — D696 Thrombocytopenia, unspecified: Secondary | ICD-10-CM

## 2020-06-04 DIAGNOSIS — R001 Bradycardia, unspecified: Secondary | ICD-10-CM | POA: Diagnosis present

## 2020-06-04 DIAGNOSIS — F10239 Alcohol dependence with withdrawal, unspecified: Secondary | ICD-10-CM

## 2020-06-04 DIAGNOSIS — I820 Budd-Chiari syndrome: Secondary | ICD-10-CM

## 2020-06-04 DIAGNOSIS — Z87891 Personal history of nicotine dependence: Secondary | ICD-10-CM

## 2020-06-04 DIAGNOSIS — G928 Other toxic encephalopathy: Secondary | ICD-10-CM | POA: Diagnosis present

## 2020-06-04 DIAGNOSIS — F10939 Alcohol use, unspecified with withdrawal, unspecified: Secondary | ICD-10-CM

## 2020-06-04 DIAGNOSIS — E890 Postprocedural hypothyroidism: Secondary | ICD-10-CM | POA: Diagnosis present

## 2020-06-04 DIAGNOSIS — Z20822 Contact with and (suspected) exposure to covid-19: Secondary | ICD-10-CM | POA: Diagnosis present

## 2020-06-04 DIAGNOSIS — G40909 Epilepsy, unspecified, not intractable, without status epilepticus: Secondary | ICD-10-CM | POA: Diagnosis present

## 2020-06-04 DIAGNOSIS — K7010 Alcoholic hepatitis without ascites: Secondary | ICD-10-CM | POA: Diagnosis present

## 2020-06-04 DIAGNOSIS — Z79890 Hormone replacement therapy (postmenopausal): Secondary | ICD-10-CM

## 2020-06-04 DIAGNOSIS — R7989 Other specified abnormal findings of blood chemistry: Secondary | ICD-10-CM

## 2020-06-04 DIAGNOSIS — Z681 Body mass index (BMI) 19 or less, adult: Secondary | ICD-10-CM

## 2020-06-04 DIAGNOSIS — E039 Hypothyroidism, unspecified: Secondary | ICD-10-CM

## 2020-06-04 LAB — LIPASE, BLOOD: Lipase: 69 U/L — ABNORMAL HIGH (ref 11–51)

## 2020-06-04 LAB — COMPREHENSIVE METABOLIC PANEL
ALT: 60 U/L — ABNORMAL HIGH (ref 0–44)
AST: 294 U/L — ABNORMAL HIGH (ref 15–41)
Albumin: 3.5 g/dL (ref 3.5–5.0)
Alkaline Phosphatase: 299 U/L — ABNORMAL HIGH (ref 38–126)
Anion gap: 21 — ABNORMAL HIGH (ref 5–15)
BUN: 8 mg/dL (ref 6–20)
CO2: 16 mmol/L — ABNORMAL LOW (ref 22–32)
Calcium: 6.8 mg/dL — ABNORMAL LOW (ref 8.9–10.3)
Chloride: 98 mmol/L (ref 98–111)
Creatinine, Ser: 0.89 mg/dL (ref 0.61–1.24)
GFR, Estimated: >60 mL/min (ref >60)
Glucose, Bld: 101 mg/dL — ABNORMAL HIGH (ref 70–99)
Potassium: 3.3 mmol/L — ABNORMAL LOW (ref 3.5–5.1)
Sodium: 135 mmol/L (ref 135–145)
Total Bilirubin: 2.9 mg/dL — ABNORMAL HIGH (ref 0.3–1.2)
Total Protein: 6.5 g/dL (ref 6.5–8.1)

## 2020-06-04 LAB — CBC
HCT: 27.8 % — ABNORMAL LOW (ref 39.0–52.0)
Hemoglobin: 9.9 g/dL — ABNORMAL LOW (ref 13.0–17.0)
MCH: 36.5 pg — ABNORMAL HIGH (ref 26.0–34.0)
MCHC: 35.6 g/dL (ref 30.0–36.0)
MCV: 102.6 fL — ABNORMAL HIGH (ref 80.0–100.0)
Platelets: 45 10*3/uL — ABNORMAL LOW (ref 150–400)
RBC: 2.71 MIL/uL — ABNORMAL LOW (ref 4.22–5.81)
RDW: 16.6 % — ABNORMAL HIGH (ref 11.5–15.5)
WBC: 5.9 10*3/uL (ref 4.0–10.5)
nRBC: 0 % (ref 0.0–0.2)

## 2020-06-04 LAB — BETA-HYDROXYBUTYRIC ACID: Beta-Hydroxybutyric Acid: 0.76 mmol/L — ABNORMAL HIGH (ref 0.05–0.27)

## 2020-06-04 LAB — RESPIRATORY PANEL BY RT PCR (FLU A&B, COVID)
Influenza A by PCR: NEGATIVE
Influenza B by PCR: NEGATIVE
SARS Coronavirus 2 by RT PCR: NEGATIVE

## 2020-06-04 LAB — TSH: TSH: 59.396 u[IU]/mL — ABNORMAL HIGH (ref 0.350–4.500)

## 2020-06-04 LAB — CK: Total CK: 321 U/L (ref 49–397)

## 2020-06-04 LAB — AMMONIA: Ammonia: 108 umol/L — ABNORMAL HIGH (ref 9–35)

## 2020-06-04 LAB — GLUCOSE, CAPILLARY: Glucose-Capillary: 110 mg/dL — ABNORMAL HIGH (ref 70–99)

## 2020-06-04 LAB — PHOSPHORUS: Phosphorus: 2.9 mg/dL (ref 2.5–4.6)

## 2020-06-04 LAB — SALICYLATE LEVEL: Salicylate Lvl: <7 mg/dL — ABNORMAL LOW (ref 7.0–30.0)

## 2020-06-04 LAB — LACTIC ACID, PLASMA: Lactic Acid, Venous: 7 mmol/L (ref 0.5–1.9)

## 2020-06-04 MED ORDER — FOLIC ACID 1 MG PO TABS: 1.0000 mg | ORAL_TABLET | Freq: Every day | ORAL | Status: DC

## 2020-06-04 MED ORDER — ADULT MULTIVITAMIN W/MINERALS CH: 1.0000 | ORAL_TABLET | Freq: Every day | ORAL | Status: DC

## 2020-06-04 MED ORDER — NICOTINE 21 MG/24HR TD PT24
21.0000 mg | MEDICATED_PATCH | Freq: Every day | TRANSDERMAL | Status: DC
  Administered 2020-06-05 – 2020-06-10 (×5): 21 mg via TRANSDERMAL
  Filled 2020-06-04 (×7): qty 1

## 2020-06-04 MED ORDER — ENSURE ENLIVE PO LIQD
237.0000 mL | Freq: Two times a day (BID) | ORAL | Status: DC
  Administered 2020-06-05 – 2020-06-10 (×6): 237 mL via ORAL

## 2020-06-04 MED ORDER — FOLIC ACID 1 MG PO TABS
1.0000 mg | ORAL_TABLET | Freq: Every day | ORAL | Status: DC
  Administered 2020-06-05 – 2020-06-11 (×7): 1 mg via ORAL
  Filled 2020-06-04 (×7): qty 1

## 2020-06-04 MED ORDER — THIAMINE HCL 100 MG/ML IJ SOLN: 100.0000 mg | Freq: Every day | INTRAMUSCULAR | Status: DC

## 2020-06-04 MED ORDER — ONDANSETRON HCL 4 MG/2ML IJ SOLN: 4.0000 mg | Freq: Four times a day (QID) | INTRAMUSCULAR | Status: DC | PRN

## 2020-06-04 MED ORDER — CALCIUM GLUCONATE-NACL 1-0.675 GM/50ML-% IV SOLN
1.0000 g | Freq: Once | INTRAVENOUS | Status: AC
  Administered 2020-06-04: 1000 mg via INTRAVENOUS
  Filled 2020-06-04: qty 50

## 2020-06-04 MED ORDER — DEXTROSE 5 % AND 0.45 % NACL IV BOLUS
1000.0000 mL | Freq: Once | INTRAVENOUS | Status: AC
  Administered 2020-06-04: 1000 mL via INTRAVENOUS
  Filled 2020-06-04: qty 1000

## 2020-06-04 MED ORDER — THIAMINE HCL 100 MG PO TABS: 100.0000 mg | ORAL_TABLET | Freq: Every day | ORAL | Status: DC

## 2020-06-04 MED ORDER — THIAMINE HCL 100 MG/ML IJ SOLN
100.0000 mg | Freq: Once | INTRAMUSCULAR | Status: AC
  Administered 2020-06-04: 100 mg via INTRAVENOUS
  Filled 2020-06-04: qty 2

## 2020-06-04 MED ORDER — THIAMINE HCL 100 MG PO TABS
100.0000 mg | ORAL_TABLET | Freq: Every day | ORAL | Status: DC
  Administered 2020-06-05 – 2020-06-06 (×2): 100 mg via ORAL
  Filled 2020-06-04 (×2): qty 1

## 2020-06-04 MED ORDER — SODIUM CHLORIDE 0.9 % IV BOLUS
1000.0000 mL | Freq: Once | INTRAVENOUS | Status: AC
  Administered 2020-06-04: 1000 mL via INTRAVENOUS

## 2020-06-04 MED ORDER — ACETAMINOPHEN 325 MG PO TABS: 650.0000 mg | ORAL_TABLET | Freq: Four times a day (QID) | ORAL | Status: DC | PRN

## 2020-06-04 MED ORDER — MAGNESIUM SULFATE 2 GM/50ML IV SOLN
2.0000 g | Freq: Once | INTRAVENOUS | Status: AC
  Administered 2020-06-04: 2 g via INTRAVENOUS
  Filled 2020-06-04: qty 50

## 2020-06-04 MED ORDER — LORAZEPAM 2 MG/ML IJ SOLN
1.0000 mg | INTRAMUSCULAR | Status: AC | PRN
  Administered 2020-06-05 – 2020-06-06 (×3): 2 mg via INTRAVENOUS
  Administered 2020-06-06 (×2): 4 mg via INTRAVENOUS
  Administered 2020-06-06: 2 mg via INTRAVENOUS
  Administered 2020-06-06 (×2): 3 mg via INTRAVENOUS
  Administered 2020-06-06: 2 mg via INTRAVENOUS
  Administered 2020-06-07: 4 mg via INTRAVENOUS
  Administered 2020-06-07: 2 mg via INTRAVENOUS
  Administered 2020-06-07 (×4): 4 mg via INTRAVENOUS
  Filled 2020-06-04: qty 2
  Filled 2020-06-04: qty 1
  Filled 2020-06-04 (×3): qty 2
  Filled 2020-06-04 (×4): qty 1
  Filled 2020-06-04 (×3): qty 2
  Filled 2020-06-04 (×2): qty 1
  Filled 2020-06-04 (×3): qty 2

## 2020-06-04 MED ORDER — ADULT MULTIVITAMIN W/MINERALS CH
1.0000 | ORAL_TABLET | Freq: Every day | ORAL | Status: DC
  Administered 2020-06-05 – 2020-06-11 (×7): 1 via ORAL
  Filled 2020-06-04 (×7): qty 1

## 2020-06-04 MED ORDER — LORAZEPAM 2 MG/ML IJ SOLN
1.0000 mg | Freq: Once | INTRAMUSCULAR | Status: AC
  Administered 2020-06-04: 1 mg via INTRAVENOUS
  Filled 2020-06-04: qty 1

## 2020-06-04 MED ORDER — ONDANSETRON HCL 4 MG PO TABS: 4.0000 mg | ORAL_TABLET | Freq: Four times a day (QID) | ORAL | Status: DC | PRN

## 2020-06-04 MED ORDER — ADULT MULTIVITAMIN W/MINERALS CH
1.0000 | ORAL_TABLET | Freq: Every day | ORAL | Status: DC
  Administered 2020-06-04: 1 via ORAL
  Filled 2020-06-04: qty 1

## 2020-06-04 MED ORDER — LORAZEPAM 2 MG/ML IJ SOLN
2.0000 mg | Freq: Once | INTRAMUSCULAR | Status: AC
  Administered 2020-06-04: 2 mg via INTRAVENOUS
  Filled 2020-06-04: qty 1

## 2020-06-04 MED ORDER — OLANZAPINE 5 MG PO TBDP
2.5000 mg | ORAL_TABLET | Freq: Every day | ORAL | Status: DC
  Administered 2020-06-05: 2.5 mg via ORAL
  Filled 2020-06-04 (×2): qty 0.5

## 2020-06-04 MED ORDER — LEVOTHYROXINE SODIUM 50 MCG PO TABS
100.0000 ug | ORAL_TABLET | Freq: Every day | ORAL | Status: DC
  Administered 2020-06-05: 100 ug via ORAL
  Filled 2020-06-04: qty 2

## 2020-06-04 MED ORDER — MAGNESIUM HYDROXIDE 400 MG/5ML PO SUSP: 30.0000 mL | Freq: Every day | ORAL | Status: DC | PRN

## 2020-06-04 MED ORDER — LORAZEPAM 1 MG PO TABS
1.0000 mg | ORAL_TABLET | ORAL | Status: AC | PRN
  Administered 2020-06-05: 2 mg via ORAL
  Administered 2020-06-05 (×2): 1 mg via ORAL
  Filled 2020-06-04 (×2): qty 1
  Filled 2020-06-04: qty 2

## 2020-06-04 MED ORDER — FOLIC ACID 1 MG PO TABS
1.0000 mg | ORAL_TABLET | Freq: Every day | ORAL | Status: DC
  Administered 2020-06-04: 1 mg via ORAL
  Filled 2020-06-04: qty 1

## 2020-06-04 MED ORDER — ACETAMINOPHEN 650 MG RE SUPP: 650.0000 mg | Freq: Four times a day (QID) | RECTAL | Status: DC | PRN

## 2020-06-04 MED ORDER — POTASSIUM CHLORIDE IN NACL 20-0.9 MEQ/L-% IV SOLN
INTRAVENOUS | Status: DC
  Filled 2020-06-04 (×7): qty 1000

## 2020-06-04 MED ORDER — TRAZODONE HCL 50 MG PO TABS
25.0000 mg | ORAL_TABLET | Freq: Every evening | ORAL | Status: DC | PRN
  Administered 2020-06-06: 25 mg via ORAL
  Filled 2020-06-04: qty 1

## 2020-06-04 NOTE — ED Provider Notes (Signed)
Patient with critically elevated lactic acid level.  CK normal salicylates negative, elevated ketones.  TSH elevated  Admitting physician Dr. Arville Care has seen and written orders.  Patient continues to appear improved, Colmer, alert conversant.  Elevated ammonia.  Patient's labs critically abnormal but he does appear clinically to be improving.  Tremulousness has improved he is alert fully oriented and conversant.   Sharyn Creamer, MD 06/04/20 2210

## 2020-06-04 NOTE — ED Provider Notes (Signed)
[facility] Emergency Department Provider Note   ____________________________________________   First MD Initiated Contact with Patient 06/04/20 1930     (approximate)  I have reviewed the triage vital signs and the nursing notes.   HISTORY  Chief Complaint Seizures  EM caveat: Patient seems just a little bit confused, likely somewhat postictal.  He is able to answer questions though fairly alert but somewhat poor historian  HPI Joshua Medina is a 34 y.o. male here for evaluation of seizures  Patient evidently had 2 seizures today, he reports this happened after he stopped drinking yesterday.    Patient reports he decided to stop drinking yesterday.  He has been shaking.  Normally drinks fairly heavily drinking roughly a pint if not more of alcohol plus beers daily  History of seizures in the past due to alcohol  Denies injury.  Denies pain or discomfort.  EMS reportedly called for concerns of seizure.  Did not receive treatment such as antiepileptics with EMS  Past Medical History:  Diagnosis Date  . Graves disease   . Thyroid disease     Patient Active Problem List   Diagnosis Date Noted  . Malnutrition of moderate degree 02/17/2020  . Hypomagnesemia 02/16/2020  . Alcohol withdrawal delirium (HCC) 02/16/2020  . Alcohol use disorder, moderate, dependence (HCC) 02/15/2020  . Hypothyroidism (acquired) 02/15/2020  . Alcoholic liver disease (HCC) 02/15/2020  . Alcohol withdrawal seizure with complication, with delirium (HCC) 02/15/2020  . Alcohol withdrawal seizure, with delirium (HCC) 02/15/2020  . Thrombocytopenia (HCC) 02/15/2020  . Anemia 02/15/2020  . Hypokalemia 02/15/2020    Past Surgical History:  Procedure Laterality Date  . thyroid ablation with radioactive I-131      Prior to Admission medications   Medication Sig Start Date End Date Taking? Authorizing Provider  feeding supplement, ENSURE ENLIVE, (ENSURE ENLIVE) LIQD Take  237 mLs by mouth 2 (two) times daily between meals. 02/20/20   Hongalgi, Maximino Greenland, MD  folic acid (FOLVITE) 1 MG tablet Take 1 tablet (1 mg total) by mouth daily. 02/21/20   Hongalgi, Maximino Greenland, MD  levothyroxine (SYNTHROID) 100 MCG tablet Take 1 tablet (100 mcg total) by mouth daily. 02/20/20 04/03/20  Elease Etienne, MD  Multiple Vitamin (MULTIVITAMIN WITH MINERALS) TABS tablet Take 1 tablet by mouth daily. 02/21/20   Hongalgi, Maximino Greenland, MD  nicotine (NICODERM CQ - DOSED IN MG/24 HOURS) 21 mg/24hr patch Place 1 patch (21 mg total) onto the skin daily. 02/21/20   Hongalgi, Maximino Greenland, MD  OLANZapine zydis (ZYPREXA) 5 MG disintegrating tablet Take 0.5 tablets (2.5 mg total) by mouth at bedtime. 02/20/20   Hongalgi, Maximino Greenland, MD  thiamine 100 MG tablet Take 1 tablet (100 mg total) by mouth daily. 02/21/20   Hongalgi, Maximino Greenland, MD    Allergies Patient has no known allergies.  Family History  Problem Relation Age of Onset  . Diabetes Paternal Grandmother     Social History Social History   Tobacco Use  . Smoking status: Current Every Day Smoker  . Smokeless tobacco: Never Used  Substance Use Topics  . Alcohol use: Yes  . Drug use: Not on file    Review of Systems  EM caveat    ____________________________________________   PHYSICAL EXAM:  VITAL SIGNS: ED Triage Vitals  Enc Vitals Group     BP 06/04/20 1938 (!) 111/92     Pulse Rate 06/04/20 1938 (!) 103     Resp 06/04/20 1938 18  Temp 06/04/20 1938 99 F (37.2 C)     Temp Source 06/04/20 1938 Oral     SpO2 06/04/20 1938 99 %     Weight 06/04/20 1939 128 lb 6.4 oz (58.2 kg)     Height 06/04/20 1939 5\' 10"  (1.778 m)     Head Circumference --      Peak Flow --      Pain Score 06/04/20 1939 0     Pain Loc --      Pain Edu? --      Excl. in GC? --     Constitutional: Alert and oriented. Well appearing and in no acute distress. Eyes: Conjunctivae are normal. Head: Atraumatic.  Hypertrophy of his parotid regions Nose: No  congestion/rhinnorhea. Mouth/Throat: Mucous membranes are moist. Neck: No stridor.  Cardiovascular:  Good peripheral circulation. Respiratory: Normal respiratory effort.  No retractions. Gastrointestinal: Soft.  No noted distention Musculoskeletal: No lower extremity tenderness.  Trace edema of hands and feet Neurologic:  Normal speech and language. No gross focal neurologic deficits are appreciated.  Skin:  Skin is warm, dry and intact. No rash noted. Psychiatric: Mood and affect are calm somewhat flat  ____________________________________________   LABS (all labs ordered are listed, but only abnormal results are displayed)  Labs Reviewed  LIPASE, BLOOD - Abnormal; Notable for the following components:      Result Value   Lipase 69 (*)    All other components within normal limits  COMPREHENSIVE METABOLIC PANEL - Abnormal; Notable for the following components:   Potassium 3.3 (*)    CO2 16 (*)    Glucose, Bld 101 (*)    Calcium 6.8 (*)    AST 294 (*)    ALT 60 (*)    Alkaline Phosphatase 299 (*)    Total Bilirubin 2.9 (*)    Anion gap 21 (*)    All other components within normal limits  MAGNESIUM - Abnormal; Notable for the following components:   Magnesium 0.9 (*)    All other components within normal limits  CBC - Abnormal; Notable for the following components:   RBC 2.71 (*)    Hemoglobin 9.9 (*)    HCT 27.8 (*)    MCV 102.6 (*)    MCH 36.5 (*)    RDW 16.6 (*)    Platelets 45 (*)    All other components within normal limits  AMMONIA - Abnormal; Notable for the following components:   Ammonia 108 (*)    All other components within normal limits  GLUCOSE, CAPILLARY - Abnormal; Notable for the following components:   Glucose-Capillary 110 (*)    All other components within normal limits  RESPIRATORY PANEL BY RT PCR (FLU A&B, COVID)  PHOSPHORUS  URINE DRUG SCREEN, QUALITATIVE ([facility] ONLY)  TSH  LACTIC ACID, PLASMA  CK  SALICYLATE LEVEL  BETA-HYDROXYBUTYRIC ACID    CBG MONITORING, ED   ____________________________________________  EKG   ____________________________________________  RADIOLOGY  CT Head Wo Contrast  Result Date: 06/04/2020 CLINICAL DATA:  Seizures, abnormal neuro exam.  Alcohol use. EXAM: CT HEAD WITHOUT CONTRAST TECHNIQUE: Contiguous axial images were obtained from the base of the skull through the vertex without intravenous contrast. COMPARISON:  CT head 04/03/2020 FINDINGS: Brain: Cerebral ventricle sizes are concordant with the degree of cerebral volume loss. No evidence of large-territorial acute infarction. No parenchymal hemorrhage. No mass lesion. No extra-axial collection. No mass effect or midline shift. No hydrocephalus. Basilar cisterns are patent. Vascular: No hyperdense vessel. Skull: No acute  fracture or focal lesion. Sinuses/Orbits: Paranasal sinuses and mastoid air cells are clear. The orbits are unremarkable. Other: None. IMPRESSION: No acute intracranial abnormality. Electronically Signed   By: Tish Frederickson M.D.   On: 06/04/2020 20:11    Imaging studies reviewed negative for acute intracranial ____________________________________________   PROCEDURES  Procedure(s) performed: None  Procedures  Critical Care performed: Yes, see critical care note(s)  CRITICAL CARE Performed by: Sharyn Creamer   Total critical care time: 35 minutes  Critical care time was exclusive of separately billable procedures and treating other patients.  Critical care was necessary to treat or prevent imminent or life-threatening deterioration.  Critical care was time spent personally by me on the following activities: development of treatment plan with patient and/or surrogate as well as nursing, discussions with consultants, evaluation of patient's response to treatment, examination of patient, obtaining history from patient or surrogate, ordering and performing treatments and interventions, ordering and review of laboratory  studies, ordering and review of radiographic studies, pulse oximetry and re-evaluation of patient's condition.  Severe electrolyte delirium derangements.  Patient with active signs of alcohol withdrawal reported having seizures requiring multiple doses of IV medications  ____________________________________________   INITIAL IMPRESSION / ASSESSMENT AND PLAN / ED COURSE  Pertinent labs & imaging results that were available during my care of the patient were reviewed by me and considered in my medical decision making (see chart for details).   Patient presents for 2 seizures evidently.  This in the setting of abruptly stopping alcohol.  Given his clinical history I suspect he had 2 alcohol withdrawal seizures.  Will provide Ativan placed on withdrawal protocol.  Monitor closely rehydrate provide thiamine.  Given his history and be a poor historian will obtain CT of the head to evaluate exclude other causes such as hemorrhage  History of previous transaminitis and significant elevation of AST's, he appears to have exam stigmata of liver disease    ----------------------------------------- 9:13 PM on 06/04/2020 -----------------------------------------  Admission discussed with Dr. Mertie Moores.  Empirically starting dextrose containing solutions repeating electrolytes, awaiting further testing including ethanol level add-on lactic acid CK and beta hydroxy.  I have elevated concern for possible alcoholic ketoacidosis but further lab work pending discussed with the hospitalist  Patient improved, resting awaiting admission agreeable with plan for admission.  Counseled him on alcohol use and need for abstinence ____________________________________________   FINAL CLINICAL IMPRESSION(S) / ED DIAGNOSES  Final diagnoses:  Increased ammonia level  Alcohol withdrawal seizure with complication [facility])        Note:  This document was prepared using Dragon voice recognition software and may include  unintentional dictation errors       Sharyn Creamer, MD 06/04/20 2115

## 2020-06-04 NOTE — ED Notes (Signed)
Report called to Donald Pore, Charity fundraiser. Will transport to floor via stretcher.

## 2020-06-04 NOTE — H&P (Addendum)
[place]   PATIENT NAME: Joshua Medina    MR#:  [id number]  DATE OF BIRTH:  22-Jul-1986  DATE OF ADMISSION:  06/04/2020  PRIMARY CARE PHYSICIAN: Patient, No Pcp Per   REQUESTING/REFERRING PHYSICIAN: Delman Kitten, MD  CHIEF COMPLAINT:   Chief Complaint  Patient presents with  . Seizures    HISTORY OF PRESENT ILLNESS:  Joshua Medina  is a 34 y.o. Caucasian male with a known history of alcohol abuse, Graves' disease, post ablative hypothyroidism and tobacco abuse, who presented to the emergency room with acute onset of seizures for 2 episodes.  He had postictal phase with confusion.  He stated that his last alcoholic drink was this morning.  He usually drinks about 6 beers and 2-3 liquor shots.  He denied any headache or dizziness or blurred vision.  No paresthesias or focal muscle weakness.  He admitted to vomiting after drinking beer.  No abdominal pain.  He has been having associated diarrhea with loose bowel movement.  No chest pain or dyspnea or cough or wheezing or hemoptysis.  He was not vaccinated for COVID-19.  He denied any recent COVID-19 exposure.  No fever or chills.  Upon position to the emergency room, potassium 3.3 and magnesium 0.9 with a sodium of 135 a CO2 of 16 anion gap of 21, calcium 6.8 with albumin of 3.5, alk phos of 299 and AST of 294 with ALT of 60.  CK was 321 and lactic acid came back 7.  CBC showed anemia worse than 2 months ago and salicylate level was less than 7 and beta hydroxybutyrate 0.76.  TSH came back elevated at 59.3.  COVID-19 PCR and influenza antigens came back negative.  Portable chest ray showed no acute cardiopulmonary disease.  Noncontrasted CT scan revealed no acute intracranial normalities.EKG showed sinus rhythm with rate of 98.  The patient was given an ampule of IV calcium gluconate, D5 half-normal saline 1 L as well as folic acid, IV Ativan per CIWA protocol and 1 L bolus of IV normal saline.  He will be admitted to the medical  monitored bed for further evaluation and management. PAST MEDICAL HISTORY:   Past Medical History:  Diagnosis Date  . Graves disease   . Thyroid disease     PAST SURGICAL HISTORY:   Past Surgical History:  Procedure Laterality Date  . thyroid ablation with radioactive I-131      SOCIAL HISTORY:   Social History   Tobacco Use  . Smoking status: Current Every Day Smoker  . Smokeless tobacco: Never Used  Substance Use Topics  . Alcohol use: Yes    FAMILY HISTORY:   Family History  Problem Relation Age of Onset  . Diabetes Paternal Grandmother     DRUG ALLERGIES:  No Known Allergies  REVIEW OF SYSTEMS:   ROS As per history of present illness. All pertinent systems were reviewed above. Constitutional, HEENT, cardiovascular, respiratory, GI, GU, musculoskeletal, neuro, psychiatric, endocrine, integumentary and hematologic systems were reviewed and are otherwise negative/unremarkable except for positive findings mentioned above in the HPI.   MEDICATIONS AT HOME:   Prior to Admission medications   Medication Sig Start Date End Date Taking? Authorizing Provider  feeding supplement, ENSURE ENLIVE, (ENSURE ENLIVE) LIQD Take 237 mLs by mouth 2 (two) times daily between Medina. 02/20/20   Hongalgi, Lenis Dickinson, MD  folic acid (FOLVITE) 1 MG tablet Take 1 tablet (1 mg total) by mouth daily. 02/21/20   Hongalgi, Lenis Dickinson, MD  levothyroxine (SYNTHROID) 100 MCG tablet Take 1 tablet (100 mcg total) by mouth daily. 02/20/20 04/03/20  Modena Jansky, MD  Multiple Vitamin (MULTIVITAMIN WITH MINERALS) TABS tablet Take 1 tablet by mouth daily. 02/21/20   Hongalgi, Lenis Dickinson, MD  nicotine (NICODERM CQ - DOSED IN MG/24 HOURS) 21 mg/24hr patch Place 1 patch (21 mg total) onto the skin daily. 02/21/20   Hongalgi, Lenis Dickinson, MD  OLANZapine zydis (ZYPREXA) 5 MG disintegrating tablet Take 0.5 tablets (2.5 mg total) by mouth at bedtime. 02/20/20   Hongalgi, Lenis Dickinson, MD  thiamine 100 MG tablet Take 1 tablet (100 mg  total) by mouth daily. 02/21/20   Hongalgi, Lenis Dickinson, MD      VITAL SIGNS:  Blood pressure (!) 122/95, pulse (!) 105, temperature 99 F (37.2 C), temperature source Oral, resp. rate 13, height '5\' 10"'  (1.778 m), weight 58.2 kg, SpO2 100 %.  PHYSICAL EXAMINATION:  Physical Exam  GENERAL:  34 y.o.-year-old Caucasian male patient lying in the bed with no acute distress.  He was fairly somnolent but easily arousable and slow to answer questions. EYES: Pupils equal, round, reactive to light and accommodation. No scleral icterus. Extraocular muscles intact.  HEENT: Head atraumatic, normocephalic. Oropharynx and nasopharynx clear.  NECK:  Supple, no jugular venous distention. No thyroid enlargement, no tenderness.  LUNGS: Normal breath sounds bilaterally, no wheezing, rales,rhonchi or crepitation. No use of accessory muscles of respiration.  CARDIOVASCULAR: Regular rate and rhythm, S1, S2 normal. No murmurs, rubs, or gallops.  ABDOMEN: Soft, nondistended, nontender. Bowel sounds present. No organomegaly or mass.  EXTREMITIES: No pedal edema, cyanosis, or clubbing.  NEUROLOGIC: Cranial nerves II through XII are intact. Muscle strength 5/5 in all extremities. Sensation intact. Gait not checked. He was fairly somnolent but easily arousable and slow to answer questions. PSYCHIATRIC: The patient is alert and oriented x 3.  Normal affect and good eye contact. SKIN: No obvious rash, lesion, or ulcer.   LABORATORY PANEL:   CBC Recent Labs  Lab 06/04/20 1935  WBC 5.9  HGB 9.9*  HCT 27.8*  PLT 45*   ------------------------------------------------------------------------------------------------------------------  Chemistries  Recent Labs  Lab 06/04/20 1935  NA 135  K 3.3*  CL 98  CO2 16*  GLUCOSE 101*  BUN 8  CREATININE 0.89  CALCIUM 6.8*  MG 0.9*  AST 294*  ALT 60*  ALKPHOS 299*  BILITOT 2.9*    ------------------------------------------------------------------------------------------------------------------  Cardiac Enzymes No results for input(s): TROPONINI in the last 168 hours. ------------------------------------------------------------------------------------------------------------------  RADIOLOGY:  CT Head Wo Contrast  Result Date: 06/04/2020 CLINICAL DATA:  Seizures, abnormal neuro exam.  Alcohol use. EXAM: CT HEAD WITHOUT CONTRAST TECHNIQUE: Contiguous axial images were obtained from the base of the skull through the vertex without intravenous contrast. COMPARISON:  CT head 04/03/2020 FINDINGS: Brain: Cerebral ventricle sizes are concordant with the degree of cerebral volume loss. No evidence of large-territorial acute infarction. No parenchymal hemorrhage. No mass lesion. No extra-axial collection. No mass effect or midline shift. No hydrocephalus. Basilar cisterns are patent. Vascular: No hyperdense vessel. Skull: No acute fracture or focal lesion. Sinuses/Orbits: Paranasal sinuses and mastoid air cells are clear. The orbits are unremarkable. Other: None. IMPRESSION: No acute intracranial abnormality. Electronically Signed   By: Iven Finn M.D.   On: 06/04/2020 20:11   DG Chest Port 1 View  Result Date: 06/04/2020 CLINICAL DATA:  Seizures EXAM: PORTABLE CHEST 1 VIEW COMPARISON:  None. FINDINGS: The heart size and mediastinal contours are within normal limits. Both lungs are  clear. The visualized skeletal structures are unremarkable. IMPRESSION: No active disease. Electronically Signed   By: Prudencio Pair M.D.   On: 06/04/2020 22:26      IMPRESSION AND PLAN:   1.  Recurrent seizures likely alcohol withdrawal seizures. -The patient will be admitted to the medical monitored bed. -He will be on seizures precautions. -We will place him on as needed IV Ativan for seizures and per CIWA protocol. -Neurology consult will be obtained as well as EEG. -I notified Dr.  Doy Mince about the patient.  2.  Hypothyroidism, uncontrolled. -His TSH is significantly elevated. -That raises a question of compliance with Synthroid. -We will place 125 mcg of p.o. Synthroid daily for now. -We will obtain a free T3 and free T4 level.  3.  Alcoholic hepatitis and thrombocytopenia secondary to chronic liver disease. -We will monitor his LFTs and platelets.  4.  Hypocalcemia and hypomagnesemia. -Calcium was replaced and will be referred replaced with p.o.  Os-Cal plus vitamin D. -Magnesium will be further replaced and followed.  4.  DVT prophylaxis. -SCDs. -Medical prophylaxis currently contraindicated due to thrombocytopenia.    All the records are reviewed and case discussed with ED provider. The plan of care was discussed in details with the patient (and family). I answered all questions. The patient agreed to proceed with the above mentioned plan. Further management will depend upon hospital course.   CODE STATUS: Full code  Status is: Inpatient  Remains inpatient appropriate because:Altered mental status, Ongoing diagnostic testing needed not appropriate for outpatient work up, Unsafe d/c plan, IV treatments appropriate due to intensity of illness or inability to take PO and Inpatient level of care appropriate due to severity of illness   Dispo: The patient is from: Home              Anticipated d/c is to: Home              Anticipated d/c date is: 2 days              Patient currently is not medically stable to d/c.   TOTAL TIME TAKING CARE OF THIS PATIENT: 55 minutes.    Christel Mormon M.D on 06/04/2020 at 11:50 PM  [facility]   From 7 PM-7 AM, contact night-coverage [email or web address]  CC: Primary care physician; Patient, No Pcp Per

## 2020-06-04 NOTE — ED Triage Notes (Signed)
Hx of alcohol related seizures, witnessed to have 2 seizures this afternoon. Last alcohol was yesterday.

## 2020-06-04 NOTE — ED Provider Notes (Signed)
The patient is noted to have a lactate>4. With the current information available to me, I don't think the patient is in septic shock. The lactate>4, is related to seizure activity.    Sharyn Creamer, MD 06/04/20 2317

## 2020-06-05 ENCOUNTER — Inpatient Hospital Stay: Payer: Self-pay

## 2020-06-05 ENCOUNTER — Encounter: Payer: Self-pay | Admitting: Family Medicine

## 2020-06-05 LAB — COMPREHENSIVE METABOLIC PANEL
ALT: 52 U/L — ABNORMAL HIGH (ref 0–44)
AST: 242 U/L — ABNORMAL HIGH (ref 15–41)
Albumin: 3 g/dL — ABNORMAL LOW (ref 3.5–5.0)
Alkaline Phosphatase: 266 U/L — ABNORMAL HIGH (ref 38–126)
Anion gap: 11 (ref 5–15)
BUN: 5 mg/dL — ABNORMAL LOW (ref 6–20)
CO2: 24 mmol/L (ref 22–32)
Calcium: 6.9 mg/dL — ABNORMAL LOW (ref 8.9–10.3)
Chloride: 100 mmol/L (ref 98–111)
Creatinine, Ser: 0.75 mg/dL (ref 0.61–1.24)
GFR, Estimated: >60 mL/min (ref >60)
Glucose, Bld: 93 mg/dL (ref 70–99)
Potassium: 3.2 mmol/L — ABNORMAL LOW (ref 3.5–5.1)
Sodium: 135 mmol/L (ref 135–145)
Total Bilirubin: 3.4 mg/dL — ABNORMAL HIGH (ref 0.3–1.2)
Total Protein: 5.9 g/dL — ABNORMAL LOW (ref 6.5–8.1)

## 2020-06-05 LAB — CBC
HCT: 26.3 % — ABNORMAL LOW (ref 39.0–52.0)
Hemoglobin: 9.4 g/dL — ABNORMAL LOW (ref 13.0–17.0)
MCH: 36.3 pg — ABNORMAL HIGH (ref 26.0–34.0)
MCHC: 35.7 g/dL (ref 30.0–36.0)
MCV: 101.5 fL — ABNORMAL HIGH (ref 80.0–100.0)
Platelets: 37 10*3/uL — ABNORMAL LOW (ref 150–400)
RBC: 2.59 MIL/uL — ABNORMAL LOW (ref 4.22–5.81)
RDW: 16.8 % — ABNORMAL HIGH (ref 11.5–15.5)
WBC: 5.1 10*3/uL (ref 4.0–10.5)
nRBC: 0 % (ref 0.0–0.2)

## 2020-06-05 LAB — LACTIC ACID, PLASMA
Lactic Acid, Venous: 3 mmol/L (ref 0.5–1.9)
Lactic Acid, Venous: 2.3 mmol/L (ref 0.5–1.9)

## 2020-06-05 LAB — MAGNESIUM
Magnesium: 0.9 mg/dL — CL (ref 1.7–2.4)
Magnesium: 1.6 mg/dL — ABNORMAL LOW (ref 1.7–2.4)

## 2020-06-05 LAB — T4, FREE: Free T4: 0.34 ng/dL — ABNORMAL LOW (ref 0.61–1.12)

## 2020-06-05 MED ORDER — LORAZEPAM 2 MG/ML IJ SOLN
1.0000 mg | INTRAMUSCULAR | Status: DC | PRN
  Administered 2020-06-05 – 2020-06-07 (×2): 1 mg via INTRAVENOUS

## 2020-06-05 MED ORDER — LEVOTHYROXINE SODIUM 100 MCG/5ML IV SOLN
75.0000 ug | Freq: Every day | INTRAVENOUS | Status: AC
  Administered 2020-06-05 – 2020-06-06 (×2): 75 ug via INTRAVENOUS
  Filled 2020-06-05 (×2): qty 5

## 2020-06-05 MED ORDER — DIAZEPAM 5 MG PO TABS: 10.0000 mg | ORAL_TABLET | Freq: Three times a day (TID) | ORAL | Status: DC

## 2020-06-05 MED ORDER — LEVOTHYROXINE SODIUM 100 MCG PO TABS
100.0000 ug | ORAL_TABLET | Freq: Every day | ORAL | Status: DC
  Administered 2020-06-07 – 2020-06-11 (×5): 100 ug via ORAL
  Filled 2020-06-05 (×5): qty 1

## 2020-06-05 MED ORDER — POTASSIUM CHLORIDE CRYS ER 20 MEQ PO TBCR
40.0000 meq | EXTENDED_RELEASE_TABLET | Freq: Two times a day (BID) | ORAL | Status: DC
  Administered 2020-06-05 – 2020-06-06 (×3): 40 meq via ORAL
  Filled 2020-06-05 (×3): qty 2

## 2020-06-05 MED ORDER — IOHEXOL 300 MG/ML  SOLN
100.0000 mL | Freq: Once | INTRAMUSCULAR | Status: AC | PRN
  Administered 2020-06-05: 100 mL via INTRAVENOUS

## 2020-06-05 MED ORDER — DIAZEPAM 5 MG PO TABS: 5.0000 mg | ORAL_TABLET | Freq: Three times a day (TID) | ORAL | Status: DC

## 2020-06-05 MED ORDER — DIAZEPAM 5 MG PO TABS
5.0000 mg | ORAL_TABLET | Freq: Three times a day (TID) | ORAL | Status: DC
  Administered 2020-06-05 – 2020-06-06 (×3): 5 mg via ORAL
  Filled 2020-06-05 (×3): qty 1

## 2020-06-05 MED ORDER — POTASSIUM CHLORIDE CRYS ER 20 MEQ PO TBCR: 40.0000 meq | EXTENDED_RELEASE_TABLET | Freq: Two times a day (BID) | ORAL | Status: DC

## 2020-06-05 MED ORDER — MAGNESIUM SULFATE 4 GM/100ML IV SOLN
4.0000 g | Freq: Once | INTRAVENOUS | Status: AC
  Administered 2020-06-05: 4 g via INTRAVENOUS
  Filled 2020-06-05: qty 100

## 2020-06-05 MED ORDER — CALCIUM CARBONATE-VITAMIN D 500-200 MG-UNIT PO TABS
1.0000 | ORAL_TABLET | Freq: Three times a day (TID) | ORAL | Status: DC
  Administered 2020-06-05 – 2020-06-11 (×18): 1 via ORAL
  Filled 2020-06-05 (×19): qty 1

## 2020-06-05 MED ORDER — IOHEXOL 9 MG/ML PO SOLN
500.0000 mL | ORAL | Status: AC
  Administered 2020-06-05 (×2): 500 mL via ORAL

## 2020-06-05 MED ORDER — MAGNESIUM SULFATE 2 GM/50ML IV SOLN
2.0000 g | Freq: Once | INTRAVENOUS | Status: AC
  Administered 2020-06-05: 2 g via INTRAVENOUS
  Filled 2020-06-05: qty 50

## 2020-06-05 NOTE — Plan of Care (Signed)
  Problem: Coping: Goal: Level of anxiety will decrease Outcome: Not Progressing   

## 2020-06-05 NOTE — Progress Notes (Signed)
   06/05/20 0530  CIWA-Ar  BP 110/81  Pulse Rate 93  Nausea and Vomiting 0  Tactile Disturbances 0  Tremor 4  Auditory Disturbances 0  Paroxysmal Sweats 1  Visual Disturbances 0  Anxiety 2  Headache, Fullness in Head 0  Agitation 1  Orientation and Clouding of Sensorium 0  CIWA-Ar Total 8  Patient states he is ready to leave d/t his wife leaving AMA. This nurse encourages patient to stay and see physician s they will be rounding soon. Patient agrees. PRN ativan to be administered. Will continue to monitor to ensure comfort and safety.

## 2020-06-05 NOTE — Progress Notes (Signed)
   06/05/20 0347  Assess: MEWS Score  Temp 99 F (37.2 C)  BP 101/79  Pulse Rate (!) 103  Resp 17  SpO2 98 %  O2 Device Room Air  Assess: MEWS Score  MEWS Temp 0  MEWS Systolic 0  MEWS Pulse 1  MEWS RR 0  MEWS LOC 0  MEWS Score 1  MEWS Score Color Green

## 2020-06-05 NOTE — ED Notes (Signed)
Report called to [facility], [facility]ity fundraiser. Patient to floor via stretcher.

## 2020-06-05 NOTE — ED Notes (Signed)
Transport on hold at this time; being told they are attempting to room patient near his admitted spouse. Will update patient on delay.

## 2020-06-05 NOTE — Progress Notes (Signed)
This nurse just made aware from ED that patient has bed bugs and that exterminator has just left from cleaning ED room patient utilized and that patient belongings are still in ED double bagged. This nurse notifies Arville Care, MD for contact isolation order.

## 2020-06-05 NOTE — Progress Notes (Addendum)
PROGRESS NOTE  Joshua Medina AJO:[id number] DOB: [birth year] DOA: 06/04/2020 PCP: Joshua Medina, No Pcp Per  HPI/Recap of past 24 hours: Joshua Medina  is a 34 y.o. Caucasian male with a known history of alcohol abuse, Graves' disease, post ablative hypothyroidism and tobacco abuse, who presented to the emergency room with acute onset of seizures for 2 episodes.  He had postictal phase with confusion.  He stated that his last alcoholic drink was this morning.  He usually drinks about 6 beers and 2-3 liquor shots.  He denied any headache or dizziness or blurred vision.  No paresthesias or focal muscle weakness.  He admitted to vomiting after drinking beer.  No abdominal pain.  He has been having associated diarrhea with loose bowel movement.  No chest pain or dyspnea or cough or wheezing or hemoptysis.  He was not vaccinated for COVID-19.  He denied any recent COVID-19 exposure.  No fever or chills.  Upon position to the emergency room, potassium 3.3 and magnesium 0.9 with a sodium of 135 a CO2 of 16 anion gap of 21, calcium 6.8 with albumin of 3.5, alk phos of 299 and AST of 294 with ALT of 60.  CK was 321 and lactic acid came back 7.  CBC showed anemia worse than 2 months ago and salicylate level was less than 7 and beta hydroxybutyrate 0.76.  TSH came back elevated at 59.3.  COVID-19 PCR and influenza antigens came back negative.  Portable chest ray showed no acute cardiopulmonary disease.  Noncontrasted CT scan revealed no acute intracranial normalities.EKG showed sinus rhythm with rate of 98.  The Joshua Medina was given an ampule of IV calcium gluconate, D5 half-normal saline 1 L as well as folic acid, IV Ativan per CIWA protocol and 1 L bolus of IV normal saline.  He will be admitted to the medical monitored bed for further evaluation and management.  06/05/20: Seen and examined.  Alert and jittery on exam, Concern for alcohol withdrawal.  CIWA protocol in place, will add Valium scheduled 3 times  daily.  Assessment/Plan: Active Problems:   Alcohol withdrawal seizure (HCC)  Recurrent seizures, likely alcohol withdrawal seizures Acute onset of seizures x2, witnessed. Heavy alcohol use, was not able to hold his alcohol down in the last 24 hours prior to presentation CT head unremarkable for any acute intracranial findings Seizure precautions CIWA protocol Neurology has been consulted, EEG has been ordered, follow results  Alcohol abuse with concern for alcohol withdrawal Moderate jitteriness on exam this morning CIWA protocol Start Valium 10 mg 3 times daily and taper down Continue multivitamins, thiamine and folate Obtain ethanol level and UDS  Uncontrolled severe hypothyroidism Alert and jittery on exam TSH 59.3 Free T4 0.34 likely secondary to noncompliance Start IV levothyroxine 75 mcg x 2 days Resume home levothyroxine 06/07/2020 Will need to repeat TSH outpatient in 4 to 6 weeks when acute illness has resolved.  Lactic acidosis Presented with lactic acid 7.0 Repeat lactic acid level  Acute transaminitis, likely secondary to alcohol use disorder Alcohol cessation counseled on at bedside  Hyperbilirubinemia Presented with total bilirubin 2.9, uptrending, 3.4 Obtain CT abdomen and pelvis with contrast Monitor and repeat LFTs in the AM  Noncompliance with medication Counseling done at bedside Joshua Medina will need reinforcement  Hypokalemia [place] 3.2 Repleted intravenously and orally.  Hypomagnesemia Serum magnesium 1.6 Repleted intravenously. Repeat magnesium level in the morning   Code Status: Full   Family Communication: None at bedside   Disposition Plan: Home when stable  Consultants:  None   Procedures:  None  Antimicrobials:  None  DVT prophylaxis: SCDs  Status is: Inpatient    Dispo:  Joshua Medina From: Home  Planned Disposition: Home  Expected discharge date: 06/07/20  Medically stable for discharge:  No         Objective: Vitals:   06/05/20 0107 06/05/20 0347 06/05/20 0530 06/05/20 0900  BP: 107/79 101/79 110/81 105/83  Pulse: (!) 102 (!) 103 93 94  Resp: '17 17 17 16  ' Temp: 99.2 F (37.3 C) 99 F (37.2 C) 98.4 F (36.9 C) 98.1 F (36.7 C)  TempSrc: Oral  Oral   SpO2: 100% 98% 100% 100%  Weight:      Height:        Intake/Output Summary (Last 24 hours) at 06/05/2020 1046 Last data filed at 06/04/2020 2254 Gross per 24 hour  Intake 1050 ml  Output --  Net 1050 ml   Filed Weights   06/04/20 1939  Weight: 58.2 kg    Exam:  . General: 34 y.o. year-old male well developed well nourished in no acute distress.  Alert and oriented x3. Jittery on exam. . Cardiovascular: Regular rate and rhythm with no rubs or gallops.  No thyromegaly or JVD noted.   Marland Kitchen Respiratory: Clear to auscultation with no wheezes or rales. Good inspiratory effort. . Abdomen: Soft nontender nondistended with bowel sounds x4 quadrants. . Musculoskeletal: No lower extremity edema. 2/4 pulses in all 4 extremities. Marland Kitchen Psychiatry: Mood is appropriate for condition and setting   Data Reviewed: CBC: Recent Labs  Lab 06/04/20 1935 06/05/20 0405  WBC 5.9 5.1  HGB 9.9* 9.4*  HCT 27.8* 26.3*  MCV 102.6* 101.5*  PLT 45* 37*   Basic Metabolic Panel: Recent Labs  Lab 06/04/20 1935 06/05/20 0405  NA 135 135  K 3.3* 3.2*  CL 98 100  CO2 16* 24  GLUCOSE 101* 93  BUN 8 5*  CREATININE 0.89 0.75  CALCIUM 6.8* 6.9*  MG 0.9* 1.6*  PHOS 2.9  --    GFR: Estimated Creatinine Clearance: 105.1 mL/min (by C-G formula based on SCr of 0.75 mg/dL). Liver Function Tests: Recent Labs  Lab 06/04/20 1935 06/05/20 0405  AST 294* 242*  ALT 60* 52*  ALKPHOS 299* 266*  BILITOT 2.9* 3.4*  PROT 6.5 5.9*  ALBUMIN 3.5 3.0*   Recent Labs  Lab 06/04/20 1935  LIPASE 69*   Recent Labs  Lab 06/04/20 1950  AMMONIA 108*   Coagulation Profile: No results for input(s): INR, PROTIME in the last 168  hours. Cardiac Enzymes: Recent Labs  Lab 06/04/20 2117  CKTOTAL 321   BNP (last 3 results) No results for input(s): PROBNP in the last 8760 hours. HbA1C: No results for input(s): HGBA1C in the last 72 hours. CBG: Recent Labs  Lab 06/04/20 1949  GLUCAP 110*   Lipid Profile: No results for input(s): CHOL, HDL, LDLCALC, TRIG, CHOLHDL, LDLDIRECT in the last 72 hours. Thyroid Function Tests: Recent Labs    06/04/20 1950 06/05/20 0405  TSH 59.396*  --   FREET4  --  0.34*   Anemia Panel: No results for input(s): VITAMINB12, FOLATE, FERRITIN, TIBC, IRON, RETICCTPCT in the last 72 hours. Urine analysis:    Component Value Date/Time   COLORURINE AMBER (A) 02/16/2020 0446   APPEARANCEUR HAZY (A) 02/16/2020 0446   LABSPEC 1.011 02/16/2020 0446   PHURINE 7.0 02/16/2020 0446   GLUCOSEU NEGATIVE 02/16/2020 0446   HGBUR NEGATIVE 02/16/2020 0446   BILIRUBINUR SMALL (A) 02/16/2020  [place] 02/16/2020 0446   PROTEINUR 100 (A) 02/16/2020 0446   NITRITE NEGATIVE 02/16/2020 0446   LEUKOCYTESUR NEGATIVE 02/16/2020 0446   Sepsis Labs: '@LABRCNTIP' (procalcitonin:4,lacticidven:4)  ) Recent Results (from the past 240 hour(s))  Respiratory Panel by RT PCR (Flu A&B, Covid) - Nasopharyngeal Swab     Status: None   Collection Time: 06/04/20  9:17 PM   Specimen: Nasopharyngeal Swab  Result Value Ref Range Status   SARS Coronavirus 2 by RT PCR NEGATIVE NEGATIVE Final    Comment: (NOTE) SARS-CoV-2 target nucleic acids are NOT DETECTED.  The SARS-CoV-2 RNA is generally detectable in upper respiratoy specimens during the acute phase of infection. The lowest concentration of SARS-CoV-2 viral copies this assay can detect is 131 copies/mL. A negative result does not preclude SARS-Cov-2 infection and should not be used as the sole basis for treatment or other Joshua Medina management decisions. A negative result may occur with  improper specimen collection/handling, submission of  specimen other than nasopharyngeal swab, presence of viral mutation(s) within the areas targeted by this assay, and inadequate number of viral copies (<131 copies/mL). A negative result must be combined with clinical observations, Joshua Medina history, and epidemiological information. The expected result is Negative.  Fact Sheet for Patients:  PinkCheek.be  Fact Sheet for Healthcare Providers:  GravelBags.it  This test is no t yet approved or cleared by the [place] FDA and  has been authorized for detection and/or diagnosis of SARS-CoV-2 by FDA under an Emergency Use Authorization (EUA). This EUA will remain  in effect (meaning this test can be used) for the duration of the COVID-19 declaration under Section 564(b)(1) of the Act, 21 U.S.C. section 360bbb-3(b)(1), unless the authorization is terminated or revoked sooner.     Influenza A by PCR NEGATIVE NEGATIVE Final   Influenza B by PCR NEGATIVE NEGATIVE Final    Comment: (NOTE) The Xpert Xpress SARS-CoV-2/FLU/RSV assay is intended as an aid in  the diagnosis of influenza from Nasopharyngeal swab specimens and  should not be used as a sole basis for treatment. Nasal washings and  aspirates are unacceptable for Xpert Xpress SARS-CoV-2/FLU/RSV  testing.  Fact Sheet for Patients: PinkCheek.be  Fact Sheet for Healthcare Providers: GravelBags.it  This test is not yet approved or cleared by the [place] FDA and  has been authorized for detection and/or diagnosis of SARS-CoV-2 by  FDA under an Emergency Use Authorization (EUA). This EUA will remain  in effect (meaning this test can be used) for the duration of the  Covid-19 declaration under Section 564(b)(1) of the Act, 21  U.S.C. section 360bbb-3(b)(1), unless the authorization is  terminated or revoked. Performed at [facility], [address], [place], [place] [address]       Studies: CT Head Wo Contrast  Result Date: 06/04/2020 CLINICAL DATA:  Seizures, abnormal neuro exam.  Alcohol use. EXAM: CT HEAD WITHOUT CONTRAST TECHNIQUE: Contiguous axial images were obtained from the base of the skull through the vertex without intravenous contrast. COMPARISON:  CT head 04/03/2020 FINDINGS: Brain: Cerebral ventricle sizes are concordant with the degree of cerebral volume loss. No evidence of large-territorial acute infarction. No parenchymal hemorrhage. No mass lesion. No extra-axial collection. No mass effect or midline shift. No hydrocephalus. Basilar cisterns are patent. Vascular: No hyperdense vessel. Skull: No acute fracture or focal lesion. Sinuses/Orbits: Paranasal sinuses and mastoid air cells are clear. The orbits are unremarkable. Other: None. IMPRESSION: No acute intracranial abnormality. Electronically Signed   By: Thomasena Edis  Mckinley Jewel M.D.   On: 06/04/2020 20:11   DG Chest Port 1 View  Result Date: 06/04/2020 CLINICAL DATA:  Seizures EXAM: PORTABLE CHEST 1 VIEW COMPARISON:  None. FINDINGS: The heart size and mediastinal contours are within normal limits. Both lungs are clear. The visualized skeletal structures are unremarkable. IMPRESSION: No active disease. Electronically Signed   By: Prudencio Pair M.D.   On: 06/04/2020 22:26    Scheduled Meds: . calcium-vitamin D  1 tablet Oral TID  . diazepam  5 mg Oral TID  . feeding supplement  237 mL Oral BID BM  . folic acid  1 mg Oral Daily  . levothyroxine  100 mcg Oral Daily  . multivitamin with minerals  1 tablet Oral Daily  . nicotine  21 mg Transdermal Daily  . OLANZapine zydis  2.5 mg Oral QHS  . potassium chloride  40 mEq Oral BID  . thiamine  100 mg Oral Daily    Continuous Infusions: . 0.9 % NaCl with KCl 20 mEq / L 100 mL/hr at 06/05/20 0118  . magnesium sulfate bolus IVPB       LOS: 1 day     Kayleen Memos, MD [facility] Pager [phone]  If 7PM-7AM,  please contact night-coverage [email or web address] Password [email or web address] 06/05/2020, 10:46 AM

## 2020-06-05 NOTE — Progress Notes (Signed)
   06/05/20 0107  Assess: MEWS Score  Temp 99.2 F (37.3 C)  BP 107/79  Pulse Rate (!) 102  SpO2 100 %  Assess: MEWS Score  MEWS Temp 0  MEWS Systolic 0  MEWS Pulse 1  MEWS RR 1  MEWS LOC 0  MEWS Score 2  MEWS Score Color Yellow  Assess: if the MEWS score is Yellow or Red  Were vital signs taken at a resting state? Yes  Focused Assessment Change from prior assessment (see assessment flowsheet)  Early Detection of Sepsis Score *See Row Information* Low  MEWS guidelines implemented *See Row Information* Yes  Treat  MEWS Interventions Administered prn meds/treatments  Pain Scale 0-10  Pain Score 0  Take Vital Signs  Increase Vital Sign Frequency  Yellow: Q 2hr X 2 then Q 4hr X 2, if remains yellow, continue Q 4hrs  Notify: Charge Nurse/RN  Name of Charge Nurse/RN Notified Kasey, RN  Date Charge Nurse/RN Notified 06/05/20  Time Charge Nurse/RN Notified 0110  Will continue to monitor to ensure comfort and safety.

## 2020-06-06 LAB — CBC WITH DIFFERENTIAL/PLATELET
Abs Immature Granulocytes: 0.1 10*3/uL — ABNORMAL HIGH (ref 0.00–0.07)
Basophils Absolute: 0.1 10*3/uL (ref 0.0–0.1)
Basophils Relative: 1 %
Eosinophils Absolute: 0.1 10*3/uL (ref 0.0–0.5)
Eosinophils Relative: 1 %
HCT: 28.9 % — ABNORMAL LOW (ref 39.0–52.0)
Hemoglobin: 10.1 g/dL — ABNORMAL LOW (ref 13.0–17.0)
Immature Granulocytes: 2 %
Lymphocytes Relative: 30 %
Lymphs Abs: 1.6 10*3/uL (ref 0.7–4.0)
MCH: 37.3 pg — ABNORMAL HIGH (ref 26.0–34.0)
MCHC: 34.9 g/dL (ref 30.0–36.0)
MCV: 106.6 fL — ABNORMAL HIGH (ref 80.0–100.0)
Monocytes Absolute: 0.3 10*3/uL (ref 0.1–1.0)
Monocytes Relative: 6 %
Neutro Abs: 3.2 10*3/uL (ref 1.7–7.7)
Neutrophils Relative %: 60 %
Platelets: 37 10*3/uL — ABNORMAL LOW (ref 150–400)
RBC: 2.71 MIL/uL — ABNORMAL LOW (ref 4.22–5.81)
RDW: 16.7 % — ABNORMAL HIGH (ref 11.5–15.5)
WBC: 5.3 10*3/uL (ref 4.0–10.5)
nRBC: 0.4 % — ABNORMAL HIGH (ref 0.0–0.2)

## 2020-06-06 LAB — PHOSPHORUS: Phosphorus: 1.2 mg/dL — ABNORMAL LOW (ref 2.5–4.6)

## 2020-06-06 LAB — GAMMA GT: GGT: 1318 U/L — ABNORMAL HIGH (ref 7–50)

## 2020-06-06 LAB — COMPREHENSIVE METABOLIC PANEL
ALT: 65 U/L — ABNORMAL HIGH (ref 0–44)
AST: 308 U/L — ABNORMAL HIGH (ref 15–41)
Albumin: 3.2 g/dL — ABNORMAL LOW (ref 3.5–5.0)
Alkaline Phosphatase: 293 U/L — ABNORMAL HIGH (ref 38–126)
Anion gap: 10 (ref 5–15)
BUN: <5 mg/dL — ABNORMAL LOW (ref 6–20)
CO2: 26 mmol/L (ref 22–32)
Calcium: 8 mg/dL — ABNORMAL LOW (ref 8.9–10.3)
Chloride: 99 mmol/L (ref 98–111)
Creatinine, Ser: 0.55 mg/dL — ABNORMAL LOW (ref 0.61–1.24)
GFR, Estimated: >60 mL/min (ref >60)
Glucose, Bld: 93 mg/dL (ref 70–99)
Potassium: 3.9 mmol/L (ref 3.5–5.1)
Sodium: 135 mmol/L (ref 135–145)
Total Bilirubin: 3.4 mg/dL — ABNORMAL HIGH (ref 0.3–1.2)
Total Protein: 6.2 g/dL — ABNORMAL LOW (ref 6.5–8.1)

## 2020-06-06 LAB — MAGNESIUM: Magnesium: 2.2 mg/dL (ref 1.7–2.4)

## 2020-06-06 LAB — PROTIME-INR
INR: 2.8 — ABNORMAL HIGH (ref 0.8–1.2)
Prothrombin Time: 28.7 seconds — ABNORMAL HIGH (ref 11.4–15.2)

## 2020-06-06 LAB — T3, FREE: T3, Free: 0.7 pg/mL — ABNORMAL LOW (ref 2.0–4.4)

## 2020-06-06 LAB — LACTIC ACID, PLASMA: Lactic Acid, Venous: 0.8 mmol/L (ref 0.5–1.9)

## 2020-06-06 LAB — BILIRUBIN, DIRECT: Bilirubin, Direct: 1.7 mg/dL — ABNORMAL HIGH (ref 0.0–0.2)

## 2020-06-06 LAB — MRSA PCR SCREENING: MRSA by PCR: NEGATIVE

## 2020-06-06 MED ORDER — SODIUM PHOSPHATES 45 MMOLE/15ML IV SOLN
20.0000 mmol | Freq: Once | INTRAVENOUS | Status: AC
  Administered 2020-06-06: 20 mmol via INTRAVENOUS
  Filled 2020-06-06: qty 6.67

## 2020-06-06 MED ORDER — DIAZEPAM 5 MG PO TABS: 10.0000 mg | ORAL_TABLET | Freq: Three times a day (TID) | ORAL | Status: DC

## 2020-06-06 MED ORDER — MAGNESIUM SULFATE 2 GM/50ML IV SOLN
2.0000 g | Freq: Once | INTRAVENOUS | Status: AC
  Administered 2020-06-06: 2 g via INTRAVENOUS
  Filled 2020-06-06: qty 50

## 2020-06-06 MED ORDER — ALBUMIN HUMAN 25 % IV SOLN
50.0000 g | Freq: Once | INTRAVENOUS | Status: AC
  Administered 2020-06-06: 12.5 g via INTRAVENOUS
  Filled 2020-06-06: qty 200

## 2020-06-06 MED ORDER — POTASSIUM CHLORIDE 10 MEQ/100ML IV SOLN
10.0000 meq | INTRAVENOUS | Status: AC
  Administered 2020-06-06 (×3): 10 meq via INTRAVENOUS
  Filled 2020-06-06 (×5): qty 100

## 2020-06-06 MED ORDER — DEXMEDETOMIDINE HCL IN NACL 200 MCG/50ML IV SOLN
0.4000 ug/kg/h | INTRAVENOUS | Status: DC
  Administered 2020-06-06: 0.6 ug/kg/h via INTRAVENOUS
  Filled 2020-06-06: qty 50

## 2020-06-06 MED ORDER — LEVETIRACETAM IN NACL 500 MG/100ML IV SOLN
500.0000 mg | Freq: Two times a day (BID) | INTRAVENOUS | Status: DC
  Administered 2020-06-07: 500 mg via INTRAVENOUS
  Filled 2020-06-06 (×3): qty 100

## 2020-06-06 MED ORDER — ALBUMIN HUMAN 25 % IV SOLN: 50.0000 g | Freq: Once | INTRAVENOUS | Status: DC

## 2020-06-06 MED ORDER — METHYLPREDNISOLONE SODIUM SUCC 40 MG IJ SOLR
40.0000 mg | INTRAMUSCULAR | Status: DC
  Administered 2020-06-06: 40 mg via INTRAVENOUS
  Filled 2020-06-06: qty 1

## 2020-06-06 MED ORDER — THIAMINE HCL 100 MG/ML IJ SOLN
500.0000 mg | Freq: Three times a day (TID) | INTRAVENOUS | Status: AC
  Administered 2020-06-06 – 2020-06-08 (×6): 500 mg via INTRAVENOUS
  Filled 2020-06-06 (×7): qty 5

## 2020-06-06 MED ORDER — LEVETIRACETAM IN NACL 1000 MG/100ML IV SOLN
1000.0000 mg | Freq: Once | INTRAVENOUS | Status: AC
  Administered 2020-06-06: 1000 mg via INTRAVENOUS
  Filled 2020-06-06: qty 100

## 2020-06-06 MED ORDER — SODIUM CHLORIDE 0.9 % IV SOLN: INTRAVENOUS | Status: DC

## 2020-06-06 MED ORDER — DEXMEDETOMIDINE HCL IN NACL 400 MCG/100ML IV SOLN: 0.4000 ug/kg/h | INTRAVENOUS | Status: DC

## 2020-06-06 MED ORDER — CHLORHEXIDINE GLUCONATE CLOTH 2 % EX PADS
6.0000 | MEDICATED_PAD | Freq: Every day | CUTANEOUS | Status: DC
  Administered 2020-06-06 – 2020-06-11 (×3): 6 via TOPICAL

## 2020-06-06 MED ORDER — NOREPINEPHRINE 4 MG/250ML-% IV SOLN
0.0000 ug/min | INTRAVENOUS | Status: DC
  Administered 2020-06-06 – 2020-06-09 (×2): 2 ug/min via INTRAVENOUS
  Filled 2020-06-06 (×3): qty 250

## 2020-06-06 MED ORDER — SODIUM CHLORIDE 0.9 % IV BOLUS: 1000.0000 mL | Freq: Once | INTRAVENOUS | Status: DC

## 2020-06-06 MED ORDER — DIPHENHYDRAMINE HCL 50 MG/ML IJ SOLN
50.0000 mg | Freq: Three times a day (TID) | INTRAMUSCULAR | Status: DC
  Administered 2020-06-06 (×2): 50 mg via INTRAVENOUS
  Filled 2020-06-06 (×2): qty 1

## 2020-06-06 MED ORDER — CALCIUM GLUCONATE-NACL 1-0.675 GM/50ML-% IV SOLN
1.0000 g | Freq: Once | INTRAVENOUS | Status: AC
  Administered 2020-06-06: 1000 mg via INTRAVENOUS
  Filled 2020-06-06 (×3): qty 50

## 2020-06-06 NOTE — Consult Note (Signed)
PHARMACY CONSULT NOTE  Pharmacy Consult for Electrolyte Monitoring and Replacement   Recent Labs: Potassium (mmol/L)  Date Value  06/06/2020 3.9   Magnesium (mg/dL)  Date Value  04/03/4817 2.2   Calcium (mg/dL)  Date Value  56/31/4970 8.0 (L)   Albumin (g/dL)  Date Value  26/37/8588 3.2 (L)   Phosphorus (mg/dL)  Date Value  50/27/7412 1.2 (L)   Sodium (mmol/L)  Date Value  06/06/2020 135     Assessment: Pharmacy has been consulted for electrolyte replacement in a patient who presented with acute onset seizures. Presently the patient is here with following issues: severe delirium tremens, acute alcoholic hepatitis, severe thrombocytopenia, and severe protein calorie malnutrition.   Electrolytes WNL  Goal of Therapy:  K 4.0- 5 Mg >/=2 All other electrolytes WNL  Plan:   Will order Sodium Phosphate IV 20 mmol x1 dose and recheck phosphorus with AM labs. Per protocol, to check 6 hours after dose given, but that time would be closer to 0500 labs. Therefore, scheduled with 0500 labs.   Will recheck electrolytes with AM labs    Katha Cabal ,PharmD Clinical Pharmacist 06/06/2020 6:08 PM

## 2020-06-06 NOTE — Consult Note (Signed)
PHARMACY CONSULT NOTE  Pharmacy Consult for Electrolyte Monitoring and Replacement   Recent Labs: Potassium (mmol/L)  Date Value  06/06/2020 3.9   Magnesium (mg/dL)  Date Value  97/74/1423 2.2   Calcium (mg/dL)  Date Value  95/32/0233 8.0 (L)   Albumin (g/dL)  Date Value  43/56/8616 3.2 (L)   Phosphorus (mg/dL)  Date Value  83/72/9021 2.9   Sodium (mmol/L)  Date Value  06/06/2020 135     Assessment: Pharmacy has been consulted for electrolyte replacement in a patient who presented with acute onset seizures. Presently the patient is here with following issues: severe delirium tremens, acute alcoholic hepatitis, severe thrombocytopenia, and severe protein calorie malnutrition.   Electrolytes WNL  Goal of Therapy:  K 4.0- 5 Mg >/=2 All other electrolytes WNL  Plan:   No electrolyte replacement needed at this time.   Will add-on phosphorus for today   Will recheck electrolytes with AM labs    Katha Cabal ,PharmD Clinical Pharmacist 06/06/2020 4:29 PM

## 2020-06-06 NOTE — Consult Note (Signed)
CRITICAL CARE CONSULTATION NOTE    Name: Joshua Medina MRN: [id number] DOB: 07/16/1986     LOS: 2   SUBJECTIVE FINDINGS & SIGNIFICANT EVENTS    Patient description:   36 M hx of Graves dz, smoker, alcoholism came in due to tonic-clonic seizure with post-ictal state. Initially admitted to [facility] on medical floor and treated for electrolyte derrangements, Delerium tremens.  Patient failed CIWA protocol on floor with progressive symptoms of combative behavior with hyperactive delerium and upgraded to MICU with PCCM consult for further management of severe DTs.   06/06/20- patient very sensitive to precedex it was initiated on admission but he immediately became hypotensive and bradycardic have dcd this therapy for now and utilizing alternate agent.    Lines/tubes : External Urinary Catheter (Active)  Collection Container Standard drainage bag 06/06/20 1300  Securement Method Securing device (Describe) 06/06/20 1300  Site Assessment Clean;Intact 06/06/20 1300    Microbiology/Sepsis markers: Results for orders placed or performed during the hospital encounter of 06/04/20  Respiratory Panel by RT PCR (Flu A&B, Covid) - Nasopharyngeal Swab     Status: None   Collection Time: 06/04/20  9:17 PM   Specimen: Nasopharyngeal Swab  Result Value Ref Range Status   SARS Coronavirus 2 by RT PCR NEGATIVE NEGATIVE Final    Comment: (NOTE) SARS-CoV-2 target nucleic acids are NOT DETECTED.  The SARS-CoV-2 RNA is generally detectable in upper respiratoy specimens during the acute phase of infection. The lowest concentration of SARS-CoV-2 viral copies this assay can detect is 131 copies/mL. A negative result does not preclude SARS-Cov-2 infection and should not be used as the sole basis for treatment or other patient management  decisions. A negative result may occur with  improper specimen collection/handling, submission of specimen other than nasopharyngeal swab, presence of viral mutation(s) within the areas targeted by this assay, and inadequate number of viral copies (<131 copies/mL). A negative result must be combined with clinical observations, patient history, and epidemiological information. The expected result is Negative.  Fact Sheet for Patients:  https://www.moore.com/  Fact Sheet for Healthcare Providers:  https://www.young.biz/  This test is no t yet approved or cleared by the [place] FDA and  has been authorized for detection and/or diagnosis of SARS-CoV-2 by FDA under an Emergency Use Authorization (EUA). This EUA will remain  in effect (meaning this test can be used) for the duration of the COVID-19 declaration under Section 564(b)(1) of the Act, 21 U.S.C. section 360bbb-3(b)(1), unless the authorization is terminated or revoked sooner.     Influenza A by PCR NEGATIVE NEGATIVE Final   Influenza B by PCR NEGATIVE NEGATIVE Final    Comment: (NOTE) The Xpert Xpress SARS-CoV-2/FLU/RSV assay is intended as an aid in  the diagnosis of influenza from Nasopharyngeal swab specimens and  should not be used as a sole basis for treatment. Nasal washings and  aspirates are unacceptable for Xpert Xpress SARS-CoV-2/FLU/RSV  testing.  Fact Sheet for Patients: https://www.moore.com/  Fact Sheet for Healthcare Providers: https://www.young.biz/  This test is not yet approved or cleared by the [place] FDA and  has been authorized for detection and/or diagnosis of SARS-CoV-2 by  FDA under an Emergency Use Authorization (EUA). This EUA will remain  in effect (meaning this test can be used) for the duration of the  Covid-19 declaration under Section 564(b)(1) of the Act, 21  U.S.C. section 360bbb-3(b)(1), unless the  authorization is  terminated or revoked. Performed at [facility], [address], [place], [place]  [address]   MRSA PCR Screening     Status: None   Collection Time: 06/06/20 12:45 PM   Specimen: Nasopharyngeal  Result Value Ref Range Status   MRSA by PCR NEGATIVE NEGATIVE Final    Comment:        The GeneXpert MRSA Assay (FDA approved for NASAL specimens only), is one component of a comprehensive MRSA colonization surveillance program. It is not intended to diagnose MRSA infection nor to guide or monitor treatment for MRSA infections. Performed at [facility], [address], [place], [place] [address]     Anti-infectives:  Anti-infectives (From admission, onward)   None       PAST MEDICAL HISTORY   Past Medical History:  Diagnosis Date  . Graves disease   . Thyroid disease      SURGICAL HISTORY   Past Surgical History:  Procedure Laterality Date  . thyroid ablation with radioactive I-131       FAMILY HISTORY   Family History  Problem Relation Age of Onset  . Diabetes Paternal Grandmother      SOCIAL HISTORY   Social History   Tobacco Use  . Smoking status: Current Every Day Smoker  . Smokeless tobacco: Never Used  Substance Use Topics  . Alcohol use: Yes  . Drug use: Not on file     MEDICATIONS   Current Medication:  Current Facility-Administered Medications:  .  acetaminophen (TYLENOL) tablet 650 mg, 650 mg, Oral, Q6H PRN **OR** acetaminophen (TYLENOL) suppository 650 mg, 650 mg, Rectal, Q6H PRN, Mansy, Jan A, MD .  calcium gluconate 1 g/ 50 mL sodium chloride IVPB, 1 g, Intravenous, Once, Hashim Eichhorst, MD .  calcium-vitamin D (OSCAL WITH D) 500-200 MG-UNIT per tablet 1 tablet, 1 tablet, Oral, TID, Mansy, Jan A, MD, 1 tablet at 06/06/20 0935 .  Chlorhexidine Gluconate Cloth 2 % PADS 6 each, 6 each, Topical, Daily, Dow Adolph N, DO, 6 each at 06/06/20 1300 .  diphenhydrAMINE (BENADRYL) injection 50 mg, 50 mg,  Intravenous, TID, Conchetta Lamia, MD .  feeding supplement (ENSURE ENLIVE / ENSURE PLUS) liquid 237 mL, 237 mL, Oral, BID BM, Mansy, Jan A, MD, 237 mL at 06/05/20 1410 .  folic acid (FOLVITE) tablet 1 mg, 1 mg, Oral, Daily, Mansy, Jan A, MD, 1 mg at 06/06/20 0936 .  levETIRAcetam (KEPPRA) IVPB 1000 mg/100 mL premix, 1,000 mg, Intravenous, Once, Vida Rigger, MD .  Melene Muller ON 06/07/2020] levETIRAcetam (KEPPRA) IVPB 500 mg/100 mL premix, 500 mg, Intravenous, Q12H, Vida Rigger, MD .  Melene Muller ON 06/07/2020] levothyroxine (SYNTHROID) tablet 100 mcg, 100 mcg, Oral, Daily, Hall, Carole N, DO .  LORazepam (ATIVAN) injection 1 mg, 1 mg, Intravenous, Q1H PRN, Mansy, Jan A, MD, 1 mg at 06/05/20 0557 .  LORazepam (ATIVAN) tablet 1-4 mg, 1-4 mg, Oral, Q1H PRN, 1 mg at 06/05/20 2159 **OR** LORazepam (ATIVAN) injection 1-4 mg, 1-4 mg, Intravenous, Q1H PRN, Sharyn Creamer, MD, 3 mg at 06/06/20 1131 .  magnesium hydroxide (MILK OF MAGNESIA) suspension 30 mL, 30 mL, Oral, Daily PRN, Mansy, Jan A, MD .  magnesium sulfate IVPB 2 g 50 mL, 2 g, Intravenous, Once, Jennea Rager, MD .  multivitamin with minerals tablet 1 tablet, 1 tablet, Oral, Daily, Mansy, Jan A, MD, 1 tablet at 06/06/20 0935 .  nicotine (NICODERM CQ - dosed in mg/24 hours) patch 21 mg, 21 mg, Transdermal, Daily, Mansy, Jan A, MD, 21 mg at 06/05/20 1039 .  norepinephrine (LEVOPHED)  in premix infusion, 0-40 mcg/min,  Intravenous, Titrated, Hall, Carole N, DO .  ondansetron (ZOFRAN) tablet 4 mg, 4 mg, Oral, Q6H PRN **OR** ondansetron (ZOFRAN) injection 4 mg, 4 mg, Intravenous, Q6H PRN, Mansy, Jan A, MD .  potassium chloride 10 mEq in 100 mL IVPB, 10 mEq, Intravenous, Q1 Hr x 3, Kylene Zamarron, MD .  thiamine 500mg  in normal saline (23ml) IVPB, 500 mg, Intravenous, TID, Hall, Carole N, DO, Last Rate: 100 mL/hr at 06/06/20 1358, 500 mg at 06/06/20 1358    ALLERGIES   Patient has no known allergies.    REVIEW OF SYSTEMS     Unable  to obtain due to encephalopathy  PHYSICAL EXAMINATION   Vital Signs: Temp:  [96.5 F (35.8 C)-98.4 F (36.9 C)] 96.5 F (35.8 C) (10/17 1300) Pulse Rate:  [57-83] 57 (10/17 1405) Resp:  [9-19] 9 (10/17 1405) BP: (88-109)/(63-85) 88/63 (10/17 1405) SpO2:  [98 %-100 %] 99 % (10/17 1405) Weight:  [59.7 kg] 59.7 kg (10/17 1239)  GENERAL:age appropriate  HEAD: Normocephalic, atraumatic.  EYES: Pupils equal, round, reactive to light.  No scleral icterus.  MOUTH: Moist mucosal membrane. Very poor dentition NECK: Supple. No thyromegaly. No nodules. No JVD.  PULMONARY: ctab CARDIOVASCULAR: S1 and S2. Regular rate and rhythm. No murmurs, rubs, or gallops.  GASTROINTESTINAL: Soft, nontender, non-distended. No masses. Positive bowel sounds. No hepatosplenomegaly.  MUSCULOSKELETAL: No swelling, clubbing, or edema, +peripheral muscle wasting NEUROLOGIC: GCS9 SKIN:intact,warm,dry   PERTINENT DATA     Infusions: . calcium gluconate    . levETIRAcetam    . [START ON 06/07/2020] levETIRAcetam    . magnesium sulfate bolus IVPB    . norepinephrine (LEVOPHED) Adult infusion    . potassium chloride    . thiamine injection 500 mg (06/06/20 1358)   Scheduled Medications: . calcium-vitamin D  1 tablet Oral TID  . Chlorhexidine Gluconate Cloth  6 each Topical Daily  . diphenhydrAMINE  50 mg Intravenous TID  . feeding supplement  237 mL Oral BID BM  . folic acid  1 mg Oral Daily  . [START ON 06/07/2020] levothyroxine  100 mcg Oral Daily  . multivitamin with minerals  1 tablet Oral Daily  . nicotine  21 mg Transdermal Daily   PRN Medications: acetaminophen **OR** acetaminophen, LORazepam, LORazepam **OR** LORazepam, magnesium hydroxide, ondansetron **OR** ondansetron (ZOFRAN) IV Hemodynamic parameters:   Intake/Output: 10/16 0701 - 10/17 0700 In: 1002.1 [I.V.:1002.1] Out: 825 [Urine:825]  Ventilator  Settings:        LAB RESULTS:  Basic Metabolic Panel: Recent Labs  Lab  06/04/20 1935 06/04/20 1935 06/05/20 0405 06/06/20 0429  NA 135  --  135 135  K 3.3*   < > 3.2* 3.9  CL 98  --  100 99  CO2 16*  --  24 26  GLUCOSE 101*  --  93 93  BUN 8  --  5* <5*  CREATININE 0.89  --  0.75 0.55*  CALCIUM 6.8*  --  6.9* 8.0*  MG 0.9*  --  1.6* 2.2  PHOS 2.9  --   --   --    < > = values in this interval not displayed.   Liver Function Tests: Recent Labs  Lab 06/04/20 1935 06/05/20 0405 06/06/20 0429  AST 294* 242* 308*  ALT 60* 52* 65*  ALKPHOS 299* 266* 293*  BILITOT 2.9* 3.4* 3.4*  PROT 6.5 5.9* 6.2*  ALBUMIN 3.5 3.0* 3.2*   Recent Labs  Lab 06/04/20 1935  LIPASE 69*   Recent Labs  Lab  06/04/20 1950  AMMONIA 108*   CBC: Recent Labs  Lab 06/04/20 1935 06/05/20 0405 06/06/20 0429  WBC 5.9 5.1 5.3  NEUTROABS  --   --  3.2  HGB 9.9* 9.4* 10.1*  HCT 27.8* 26.3* 28.9*  MCV 102.6* 101.5* 106.6*  PLT 45* 37* 37*   Cardiac Enzymes: Recent Labs  Lab 06/04/20 2117  CKTOTAL 321   BNP: Invalid input(s): POCBNP CBG: Recent Labs  Lab 06/04/20 1949  GLUCAP 110*       IMAGING RESULTS:  Imaging: CT Head Wo Contrast  Result Date: 06/04/2020 CLINICAL DATA:  Seizures, abnormal neuro exam.  Alcohol use. EXAM: CT HEAD WITHOUT CONTRAST TECHNIQUE: Contiguous axial images were obtained from the base of the skull through the vertex without intravenous contrast. COMPARISON:  CT head 04/03/2020 FINDINGS: Brain: Cerebral ventricle sizes are concordant with the degree of cerebral volume loss. No evidence of large-territorial acute infarction. No parenchymal hemorrhage. No mass lesion. No extra-axial collection. No mass effect or midline shift. No hydrocephalus. Basilar cisterns are patent. Vascular: No hyperdense vessel. Skull: No acute fracture or focal lesion. Sinuses/Orbits: Paranasal sinuses and mastoid air cells are clear. The orbits are unremarkable. Other: None. IMPRESSION: No acute intracranial abnormality. Electronically Signed   By:  Tish Frederickson M.D.   On: 06/04/2020 20:11   CT ABDOMEN PELVIS W CONTRAST  Result Date: 06/05/2020 CLINICAL DATA:  Acute hepatitis.  History of alcohol abuse. EXAM: CT ABDOMEN AND PELVIS WITH CONTRAST TECHNIQUE: Multidetector CT imaging of the abdomen and pelvis was performed using the standard protocol following bolus administration of intravenous contrast. CONTRAST:  OMNIPAQUE IOHEXOL 300 MG/ML  SOLN COMPARISON:  None. FINDINGS: Lower chest:  No contributory findings. Hepatobiliary: Prominent hepatic steatosis. No evidence of mass or surface nodularity.No evidence of biliary obstruction or stone. Pancreas: Unremarkable. Spleen: Unremarkable. Adrenals/Urinary Tract: Negative adrenals. Punctate densities at the bilateral renal collecting system may be early excretion of contrast rather than small calculi. Unremarkable bladder. Stomach/Bowel: No obstruction. No appendicitis. Enteroenteric intussusception in the left upper quadrant measuring 2.6 cm in length, nonobstructive and without visible underlying mass. Vascular/Lymphatic: No acute vascular abnormality. No mass or adenopathy. Reproductive:Negative. Other: Trace peritoneal fluid best seen in the right lower quadrant and bilateral gutter. Musculoskeletal: No acute abnormalities. IMPRESSION: 1. Marked hepatic steatosis. 2. Trace peritoneal fluid, likely reactive. 3. Short segment and nonobstructive enteroenteric intussusception in the left upper quadrant, usually transient and incidental. Electronically Signed   By: Marnee Spring M.D.   On: 06/05/2020 18:40   DG Chest Port 1 View  Result Date: 06/04/2020 CLINICAL DATA:  Seizures EXAM: PORTABLE CHEST 1 VIEW COMPARISON:  None. FINDINGS: The heart size and mediastinal contours are within normal limits. Both lungs are clear. The visualized skeletal structures are unremarkable. IMPRESSION: No active disease. Electronically Signed   By: Jonna Clark M.D.   On: 06/04/2020 22:26   @PROBHOSP @ CT  ABDOMEN PELVIS W CONTRAST  Result Date: 06/05/2020 CLINICAL DATA:  Acute hepatitis.  History of alcohol abuse. EXAM: CT ABDOMEN AND PELVIS WITH CONTRAST TECHNIQUE: Multidetector CT imaging of the abdomen and pelvis was performed using the standard protocol following bolus administration of intravenous contrast. CONTRAST:  06/07/2020 OMNIPAQUE IOHEXOL 300 MG/ML  SOLN COMPARISON:  None. FINDINGS: Lower chest:  No contributory findings. Hepatobiliary: Prominent hepatic steatosis. No evidence of mass or surface nodularity.No evidence of biliary obstruction or stone. Pancreas: Unremarkable. Spleen: Unremarkable. Adrenals/Urinary Tract: Negative adrenals. Punctate densities at the bilateral renal collecting system may be early excretion of contrast  rather than small calculi. Unremarkable bladder. Stomach/Bowel: No obstruction. No appendicitis. Enteroenteric intussusception in the left upper quadrant measuring 2.6 cm in length, nonobstructive and without visible underlying mass. Vascular/Lymphatic: No acute vascular abnormality. No mass or adenopathy. Reproductive:Negative. Other: Trace peritoneal fluid best seen in the right lower quadrant and bilateral gutter. Musculoskeletal: No acute abnormalities. IMPRESSION: 1. Marked hepatic steatosis. 2. Trace peritoneal fluid, likely reactive. 3. Short segment and nonobstructive enteroenteric intussusception in the left upper quadrant, usually transient and incidental. Electronically Signed   By: Marnee SpringJonathon  Watts M.D.   On: 06/05/2020 18:40     ASSESSMENT AND PLAN    -Multidisciplinary rounds held today    Severe Delerium Tremens   - continue CIWA  - s/p Ativan    - delivered Keppra loading +BID today    -adverse reaction with Precedex - brady/hypotensive  - currently with RASSS- 0   Acute alcoholic hepatitis    - MDF- >32 - will start solumedrol  -supportive care -LR IVF -monitor UOP and fluid balance   Severe thrombocytopenia    - due to alcoholism    -  continue with steroids    - monitor for signs of bleeding    - avoid foley catheter use external catheter   Severe protein calorie malnutrition   -Bi-termporal and peripheral muscle wasting on admission    - caution - refeeding syndrome   - aggressive electrolyte repletion - pharm D consultation ICU monitoring    GI/Nutrition GI PROPHYLAXIS as indicated DIET-->TF's as tolerated Constipation protocol as indicated  ENDO - ICU hypoglycemic\Hyperglycemia protocol -check FSBS per protocol   ELECTROLYTES -follow labs as needed -replace as needed -pharmacy consultation   DVT/GI PRX ordered -SCDs  TRANSFUSIONS AS NEEDED MONITOR FSBS ASSESS the need for LABS as needed   Critical care provider statement:    Critical care time (minutes):  33   Critical care time was exclusive of:  Separately billable procedures and treating other patients   Critical care was necessary to treat or prevent imminent or life-threatening deterioration of the following conditions:  Delerium tremens, severe protein calorie malnutrition, alcohilims, transamititis, seizures, electrolyte derrangement, confusion/ecnephalopathy   Critical care was time spent personally by me on the following activities:  Development of treatment plan with patient or surrogate, discussions with consultants, evaluation of patient's response to treatment, examination of patient, obtaining history from patient or surrogate, ordering and performing treatments and interventions, ordering and review of laboratory studies and re-evaluation of patient's condition.  I assumed direction of critical care for this patient from another provider in my specialty: no    This document was prepared using Dragon voice recognition software and may include unintentional dictation errors.    Vida RiggerFuad Maimuna Leaman, M.D.  Division of Pulmonary & Critical Care Medicine  [facility]

## 2020-06-06 NOTE — Progress Notes (Signed)
Patient is being transferred to step down for precedex drip and closer monitoring d/t alcohol withdrawal.

## 2020-06-06 NOTE — Progress Notes (Addendum)
 PROGRESS NOTE  Joshua Medina MRN:[id number] DOB: 01/31/1986 DOA: 06/04/2020 PCP: Patient, No Pcp Per  HPI/Recap of past 24 hours: Joshua Medina  is a 34 y.o. Caucasian male with a known history of heavy alcohol abuse, Graves' disease, post ablative hypothyroidism and tobacco abuse, who presented to [facility] with witnessed acute onset of seizures x 2 episodes.  He had postictal phase with confusion.  He stated that his last alcoholic drink was the morning of his presentation.  He usually drinks about 6 beers and 2-3 liquor shots.  He admitted to vomiting after drinking beer.  No abdominal pain.  He has been having associated diarrhea with loose bowel movement.  He was not vaccinated for COVID-19.  Covid-19 screening test negative.  Upon position to the emergency room, potassium 3.3 and magnesium 0.9 with a sodium of 135 a CO2 of 16 anion gap of 21, calcium 6.8 with albumin of 3.5, alk phos of 299 and AST of 294 with ALT of 60.  CK was 321 and lactic acid came back 7.  CBC showed anemia worse than 2 months ago and salicylate level was less than 7 and beta hydroxybutyrate 0.76.  TSH came back elevated at 59.3.  Portable chest ray showed no acute cardiopulmonary disease.  Noncontrasted CT scan revealed no acute intracranial normalities.  EKG showed sinus rhythm with rate of 98.  The patient was given an ampule of IV calcium gluconate, D5 half-normal saline 1 L as well as folic acid, IV Ativan per CIWA protocol and 1 L bolus of IV normal saline.  He was admitted for further evaluation and management.  06/06/20: Seen and examined.  Very agitated, in the room with one to one sitter.  Nursing staff reporting passing chewing on telemetry leads.  Currently on CIWA protocol and scheduled valium with no improvement of his severe agitation.  PCCM consulted for possible Precedex administration.  Assessment/Plan: Active Problems:   Alcohol withdrawal seizure (HCC)  Alcohol withdrawal seizures Acute onset of  seizures x2, witnessed by his mother in law at home Hx of heavy alcohol use, was not able to hold his alcohol down in the last 24 hours prior to presentation CT head unremarkable for any acute intracranial findings Seizure precautions and CIWA protocol  in place  Severe agitation with concern for severe alcohol withdrawal Worsening agitation this AM, chewing at telemetry leads in the room per nursing staff One to one sitter in place for patient's own safety  Not responding to benzodiazepines PCCM consulted for possible Precedex administration. Currently on Ativan-CIWA protocol, Valium 10 mg 3 times daily  Continue multivitamins, thiamine and folate Transfer to stepdown unit for possible Precedex infusion and close monitoring  Acute metabolic encephalopathy suspected wernicke's encephalopathy 2/2 to chronic heavy alcohol abuse with concern for severe alcohol withdrawal Confused, gait ataxia Treat underlying condition Start high dose IV thiamine 500 mg TID x 2 days then 250 mg daily x 5 days After completion of parenteral thiamine, start oral thiamine 100 mg daily and continue after discharge  Uncontrolled severe hypothyroidism Presented with TSH 59.3 Free T4 0.34 likely secondary to noncompliance Started IV levothyroxine 75 mcg x 2 days on 06/05/20 Resume home levothyroxine 06/07/2020 Will need to repeat TSH outpatient in 4 to 6 weeks when acute illness has resolved.  Lactic acidosis, improving Presented with lactic acid 7.0>> 2.3 Continue IV fluid  Worsening Acute transaminitis, likely secondary to alcohol use disorder LFTs are up trending Alcohol cessation counseled on at bedside  Hyperbilirubinemia Presented with   total bilirubin 2.9, uptrending, 3.4 CT abdomen and pelvis with contrast done on 06/05/20 showed marked hepatic steatosis, trace peritoneal fluid, likely reactive, short segment and nonobstructive enteroenteric intussusception in the left upper quadrant, usually  transient and incidental. Continue to monitor and repeat LFTs in the AM  Macrocytic anemia likely 2/2 to alcoholic liver disease Hg up trending 10.1 from 9.4 with MCV 106.6 No overt bleeding Continue folic acid, high dose thiamine Monitor  Chronic thrombocytopenia 2/2 to alcoholic liver disease Plt count 37K No overt bleeding Continue to monitor  Noncompliance with medication Discussed at bedside on 06/05/20 when he was more lucid.  Resolved post repletion: Hypokalemia 3.9 from 3.2 Repleted intravenously and orally.  Resolving Hypomagnesemia Serum magnesium 1.6> 1.7 Repleted intravenously. Repeat magnesium level in the morning  Bed bugs Contact precautions Supportive care as needed   Code Status: Full   Family Communication: None at bedside   Disposition Plan: Home when stable   Consultants:  PCCM   Procedures:  None  Antimicrobials:  None  DVT prophylaxis: SCDs  Status is: Inpatient    Dispo:  Patient From: Home  Planned Disposition: Home  Expected discharge date: 06/09/20  Medically stable for discharge: No, ongoing management of severe alcohol withdrawal.         Objective: Vitals:   06/05/20 1252 06/05/20 1709 06/05/20 2345 06/06/20 0612  BP: 103/75 102/85 109/80 91/76  Pulse: 90 75 83 80  Resp: 19 14  14  Temp: 98.2 F (36.8 C) 97.6 F (36.4 C) 98.4 F (36.9 C) 97.7 F (36.5 C)  TempSrc: Oral Oral Oral Oral  SpO2: 100% 100% 100% 100%  Weight:      Height:        Intake/Output Summary (Last 24 hours) at 06/06/2020 1052 Last data filed at 06/05/2020 1645 Gross per 24 hour  Intake 1002.1 ml  Output 825 ml  Net 177.1 ml   Filed Weights   06/04/20 1939  Weight: 58.2 kg    Exam:  . General: 34 y.o. year-old male well-developed well-nourished no distress. Alert agitated, not following commands.  . Cardiovascular: Regular rate and rhythm no rubs or gallops. . Respiratory: Clear to auscultation no wheezes or  rales . Abdomen: Soft nontender normal bowel sounds present. . Musculoskeletal: No lower extremity edema bilaterally.  . Psychiatry: Mood is appropriate for condition and setting.  Data Reviewed: CBC: Recent Labs  Lab 06/04/20 1935 06/05/20 0405 06/06/20 0429  WBC 5.9 5.1 5.3  NEUTROABS  --   --  3.2  HGB 9.9* 9.4* 10.1*  HCT 27.8* 26.3* 28.9*  MCV 102.6* 101.5* 106.6*  PLT 45* 37* 37*   Basic Metabolic Panel: Recent Labs  Lab 06/04/20 1935 06/05/20 0405 06/06/20 0429  NA 135 135 135  K 3.3* 3.2* 3.9  CL 98 100 99  CO2 16* 24 26  GLUCOSE 101* 93 93  BUN 8 5* <5*  CREATININE 0.89 0.75 0.55*  CALCIUM 6.8* 6.9* 8.0*  MG 0.9* 1.6* 2.2  PHOS 2.9  --   --    GFR: Estimated Creatinine Clearance: 105.1 mL/min (A) (by C-G formula based on SCr of 0.55 mg/dL (L)). Liver Function Tests: Recent Labs  Lab 06/04/20 1935 06/05/20 0405 06/06/20 0429  AST 294* 242* 308*  ALT 60* 52* 65*  ALKPHOS 299* 266* 293*  BILITOT 2.9* 3.4* 3.4*  PROT 6.5 5.9* 6.2*  ALBUMIN 3.5 3.0* 3.2*   Recent Labs  Lab 06/04/20 1935  LIPASE 69*   Recent Labs  Lab   06/04/20 1950  AMMONIA 108*   Coagulation Profile: No results for input(s): INR, PROTIME in the last 168 hours. Cardiac Enzymes: Recent Labs  Lab 06/04/20 2117  CKTOTAL 321   BNP (last 3 results) No results for input(s): PROBNP in the last 8760 hours. HbA1C: No results for input(s): HGBA1C in the last 72 hours. CBG: Recent Labs  Lab 06/04/20 1949  GLUCAP 110*   Lipid Profile: No results for input(s): CHOL, HDL, LDLCALC, TRIG, CHOLHDL, LDLDIRECT in the last 72 hours. Thyroid Function Tests: Recent Labs    06/04/20 1950 06/05/20 0405  TSH 59.396*  --   FREET4  --  0.34*   Anemia Panel: No results for input(s): VITAMINB12, FOLATE, FERRITIN, TIBC, IRON, RETICCTPCT in the last 72 hours. Urine analysis:    Component Value Date/Time   COLORURINE AMBER (A) 02/16/2020 0446   APPEARANCEUR HAZY (A) 02/16/2020 0446    LABSPEC 1.011 02/16/2020 0446   PHURINE 7.0 02/16/2020 0446   GLUCOSEU NEGATIVE 02/16/2020 0446   HGBUR NEGATIVE 02/16/2020 0446   BILIRUBINUR SMALL (A) 02/16/2020 0446   KETONESUR NEGATIVE 02/16/2020 0446   PROTEINUR 100 (A) 02/16/2020 0446   NITRITE NEGATIVE 02/16/2020 0446   LEUKOCYTESUR NEGATIVE 02/16/2020 0446   Sepsis Labs: _0 (procalcitonin:4,lacticidven:4)  ) Recent Results (from the past 240 hour(s))  Respiratory Panel by RT PCR (Flu A&B, Covid) - Nasopharyngeal Swab     Status: None   Collection Time: 06/04/20  9:17 PM   Specimen: Nasopharyngeal Swab  Result Value Ref Range Status   SARS Coronavirus 2 by RT PCR NEGATIVE NEGATIVE Final    Comment: (NOTE) SARS-CoV-2 target nucleic acids are NOT DETECTED.  The SARS-CoV-2 RNA is generally detectable in upper respiratoy specimens during the acute phase of infection. The lowest concentration of SARS-CoV-2 viral copies this assay can detect is 131 copies/mL. A negative result does not preclude SARS-Cov-2 infection and should not be used as the sole basis for treatment or other patient management decisions. A negative result may occur with  improper specimen collection/handling, submission of specimen other than nasopharyngeal swab, presence of viral mutation(s) within the areas targeted by this assay, and inadequate number of viral copies (<131 copies/mL). A negative result must be combined with clinical observations, patient history, and epidemiological information. The expected result is Negative.  Fact Sheet for Patients:  PinkCheek.be  Fact Sheet for Healthcare Providers:  GravelBags.it  This test is no t yet approved or cleared by the [place] FDA and  has been authorized for detection and/or diagnosis of SARS-CoV-2 by FDA under an Emergency Use Authorization (EUA). This EUA will remain  in effect (meaning this test can be used) for the duration  of the COVID-19 declaration under Section 564(b)(1) of the Act, 21 U.S.C. section 360bbb-3(b)(1), unless the authorization is terminated or revoked sooner.     Influenza A by PCR NEGATIVE NEGATIVE Final   Influenza B by PCR NEGATIVE NEGATIVE Final    Comment: (NOTE) The Xpert Xpress SARS-CoV-2/FLU/RSV assay is intended as an aid in  the diagnosis of influenza from Nasopharyngeal swab specimens and  should not be used as a sole basis for treatment. Nasal washings and  aspirates are unacceptable for Xpert Xpress SARS-CoV-2/FLU/RSV  testing.  Fact Sheet for Patients: PinkCheek.be  Fact Sheet for Healthcare Providers: GravelBags.it  This test is not yet approved or cleared by the [place] FDA and  has been authorized for detection and/or diagnosis of SARS-CoV-2 by  FDA under an Emergency Use Authorization (EUA). This EUA  will remain  in effect (meaning this test can be used) for the duration of the  Covid-19 declaration under Section 564(b)(1) of the Act, 21  U.S.C. section 360bbb-3(b)(1), unless the authorization is  terminated or revoked. Performed at [facility], [address] [place], [place] [address]       Studies: CT ABDOMEN PELVIS W CONTRAST  Result Date: 06/05/2020 CLINICAL DATA:  Acute hepatitis.  History of alcohol abuse. EXAM: CT ABDOMEN AND PELVIS WITH CONTRAST TECHNIQUE: Multidetector CT imaging of the abdomen and pelvis was performed using the standard protocol following bolus administration of intravenous contrast. CONTRAST:  147m OMNIPAQUE IOHEXOL 300 MG/ML  SOLN COMPARISON:  None. FINDINGS: Lower chest:  No contributory findings. Hepatobiliary: Prominent hepatic steatosis. No evidence of mass or surface nodularity.No evidence of biliary obstruction or stone. Pancreas: Unremarkable. Spleen: Unremarkable. Adrenals/Urinary Tract: Negative adrenals. Punctate densities at the bilateral renal  collecting system may be early excretion of contrast rather than small calculi. Unremarkable bladder. Stomach/Bowel: No obstruction. No appendicitis. Enteroenteric intussusception in the left upper quadrant measuring 2.6 cm in length, nonobstructive and without visible underlying mass. Vascular/Lymphatic: No acute vascular abnormality. No mass or adenopathy. Reproductive:Negative. Other: Trace peritoneal fluid best seen in the right lower quadrant and bilateral gutter. Musculoskeletal: No acute abnormalities. IMPRESSION: 1. Marked hepatic steatosis. 2. Trace peritoneal fluid, likely reactive. 3. Short segment and nonobstructive enteroenteric intussusception in the left upper quadrant, usually transient and incidental. Electronically Signed   By: JMonte FantasiaM.D.   On: 06/05/2020 18:40    Scheduled Meds: . calcium-vitamin D  1 tablet Oral TID  . diazepam  10 mg Oral TID  . feeding supplement  237 mL Oral BID BM  . folic acid  1 mg Oral Daily  . [START ON 06/07/2020] levothyroxine  100 mcg Oral Daily  . multivitamin with minerals  1 tablet Oral Daily  . nicotine  21 mg Transdermal Daily  . OLANZapine zydis  2.5 mg Oral QHS  . potassium chloride  40 mEq Oral BID  . thiamine  100 mg Oral Daily    Continuous Infusions: . 0.9 % NaCl with KCl 20 mEq / L 100 mL/hr at 06/06/20 0824     LOS: 2 days     CKayleen Memos MD [facility] Pager [phone] If 7PM-7AM, please contact night-coverage [email or web address] Password [email or web address] 06/06/2020, 10:52 AM

## 2020-06-06 NOTE — Progress Notes (Signed)
A consult was placed to the IV Therapist to place a midline STAT;  Attempted in LUA but was not able to thread the guidewire;  Made 2nd attempt in pt's Right forearm and placed an 18ga x 8cm extended dwell catheter;  Good bld return on insertion, but return became sluggish; flushed very easily with 20cc NS;  Site WNL;   Pt tolerated well,  as tech assisted in holding his arm; mittens on each hand.

## 2020-06-07 DIAGNOSIS — F10239 Alcohol dependence with withdrawal, unspecified: Secondary | ICD-10-CM

## 2020-06-07 DIAGNOSIS — F10231 Alcohol dependence with withdrawal delirium: Principal | ICD-10-CM

## 2020-06-07 DIAGNOSIS — R569 Unspecified convulsions: Secondary | ICD-10-CM

## 2020-06-07 LAB — COMPREHENSIVE METABOLIC PANEL
ALT: 51 U/L — ABNORMAL HIGH (ref 0–44)
AST: 173 U/L — ABNORMAL HIGH (ref 15–41)
Albumin: 3.5 g/dL (ref 3.5–5.0)
Alkaline Phosphatase: 229 U/L — ABNORMAL HIGH (ref 38–126)
Anion gap: 11 (ref 5–15)
BUN: <5 mg/dL — ABNORMAL LOW (ref 6–20)
CO2: 22 mmol/L (ref 22–32)
Calcium: 9.1 mg/dL (ref 8.9–10.3)
Chloride: 101 mmol/L (ref 98–111)
Creatinine, Ser: 0.68 mg/dL (ref 0.61–1.24)
GFR, Estimated: >60 mL/min (ref >60)
Glucose, Bld: 108 mg/dL — ABNORMAL HIGH (ref 70–99)
Potassium: 5 mmol/L (ref 3.5–5.1)
Sodium: 134 mmol/L — ABNORMAL LOW (ref 135–145)
Total Bilirubin: 3.2 mg/dL — ABNORMAL HIGH (ref 0.3–1.2)
Total Protein: 5.9 g/dL — ABNORMAL LOW (ref 6.5–8.1)

## 2020-06-07 LAB — HEPATITIS PANEL, ACUTE
HCV Ab: NONREACTIVE
Hep A IgM: NONREACTIVE
Hep B C IgM: NONREACTIVE
Hepatitis B Surface Ag: NONREACTIVE

## 2020-06-07 LAB — CBC WITH DIFFERENTIAL/PLATELET
Abs Immature Granulocytes: 0.09 10*3/uL — ABNORMAL HIGH (ref 0.00–0.07)
Basophils Absolute: 0 10*3/uL (ref 0.0–0.1)
Basophils Relative: 0 %
Eosinophils Absolute: 0 10*3/uL (ref 0.0–0.5)
Eosinophils Relative: 0 %
HCT: 26.2 % — ABNORMAL LOW (ref 39.0–52.0)
Hemoglobin: 8.8 g/dL — ABNORMAL LOW (ref 13.0–17.0)
Immature Granulocytes: 2 %
Lymphocytes Relative: 20 %
Lymphs Abs: 0.9 10*3/uL (ref 0.7–4.0)
MCH: 36.7 pg — ABNORMAL HIGH (ref 26.0–34.0)
MCHC: 33.6 g/dL (ref 30.0–36.0)
MCV: 109.2 fL — ABNORMAL HIGH (ref 80.0–100.0)
Monocytes Absolute: 0.2 10*3/uL (ref 0.1–1.0)
Monocytes Relative: 5 %
Neutro Abs: 3.3 10*3/uL (ref 1.7–7.7)
Neutrophils Relative %: 73 %
Platelets: 39 10*3/uL — ABNORMAL LOW (ref 150–400)
RBC: 2.4 MIL/uL — ABNORMAL LOW (ref 4.22–5.81)
RDW: 16.6 % — ABNORMAL HIGH (ref 11.5–15.5)
WBC: 4.5 10*3/uL (ref 4.0–10.5)
nRBC: 0.4 % — ABNORMAL HIGH (ref 0.0–0.2)

## 2020-06-07 LAB — MAGNESIUM: Magnesium: 1.8 mg/dL (ref 1.7–2.4)

## 2020-06-07 LAB — PHOSPHORUS: Phosphorus: 5.2 mg/dL — ABNORMAL HIGH (ref 2.5–4.6)

## 2020-06-07 LAB — GLUCOSE, CAPILLARY: Glucose-Capillary: 104 mg/dL — ABNORMAL HIGH (ref 70–99)

## 2020-06-07 MED ORDER — LACTATED RINGERS IV BOLUS
1000.0000 mL | Freq: Once | INTRAVENOUS | Status: AC
  Administered 2020-06-07: 1000 mL via INTRAVENOUS

## 2020-06-07 MED ORDER — LORAZEPAM 1 MG PO TABS
1.0000 mg | ORAL_TABLET | ORAL | Status: AC | PRN
  Administered 2020-06-08: 1 mg via ORAL
  Administered 2020-06-08: 3 mg via ORAL
  Filled 2020-06-07: qty 3
  Filled 2020-06-07: qty 1

## 2020-06-07 MED ORDER — LORAZEPAM 2 MG/ML IJ SOLN
1.0000 mg | INTRAMUSCULAR | Status: AC | PRN
  Administered 2020-06-07: 4 mg via INTRAVENOUS
  Administered 2020-06-08: 2 mg via INTRAVENOUS
  Administered 2020-06-08: 4 mg via INTRAVENOUS
  Administered 2020-06-08 (×2): 3 mg via INTRAVENOUS
  Administered 2020-06-08 (×3): 4 mg via INTRAVENOUS
  Administered 2020-06-09: 2 mg via INTRAVENOUS
  Filled 2020-06-07: qty 1
  Filled 2020-06-07 (×6): qty 2
  Filled 2020-06-07: qty 1
  Filled 2020-06-07: qty 2

## 2020-06-07 NOTE — Procedures (Signed)
Patient Name: Joshua Medina  MRN: [id number]  Epilepsy Attending: [name]  Referring Physician/Provider: Dr. Valente David Date: 06/07/2020 Duration: 24.53 minutes  Patient history: 34 year old male with history of alcoholism who presented with generalized tonic-clonic seizure.  EEG to evaluate for seizures.  Level of alertness: Awake  AEDs during EEG study: Ativan  Technical aspects: This EEG study was done with scalp electrodes positioned according to the 10-20 International system of electrode placement. Electrical activity was acquired at a sampling rate of 500Hz  and reviewed with a high frequency filter of 70Hz  and a low frequency filter of 1Hz . EEG data were recorded continuously and digitally stored.   Description: No clear posterior rhythm seen.  EEG showed generalized generalized 2 to 3 Hz delta slowing with overriding 15 to 18 Hz generalized beta activity.  Hyperventilation and photic stimulation were not performed.   Of note, there was significant movement artifact throughout the EEG.  ABNORMALITY -Continuous slow, generalized -Excessive beta, generalized  IMPRESSION: This technically difficult study is suggestive of mild to moderate diffuse encephalopathy, nonspecific etiology but likely related to seizures, ativan use. No seizures or epileptiform discharges were seen throughout the recording.  [name] 

## 2020-06-07 NOTE — Consult Note (Signed)
PHARMACY CONSULT NOTE  Pharmacy Consult for Electrolyte Monitoring and Replacement   Recent Labs: Potassium (mmol/L)  Date Value  06/07/2020 5.0   Magnesium (mg/dL)  Date Value  76/72/0947 1.8   Calcium (mg/dL)  Date Value  09/62/8366 9.1   Albumin (g/dL)  Date Value  29/47/6546 3.5   Phosphorus (mg/dL)  Date Value  50/35/4656 5.2 (H)   Sodium (mmol/L)  Date Value  06/07/2020 134 (L)     Assessment: Pharmacy has been consulted for electrolyte replacement in a patient who presented with acute onset seizures. Presently the patient is here with following issues: severe delirium tremens, acute alcoholic hepatitis, severe thrombocytopenia, and severe protein calorie malnutrition.   Electrolytes WNL  Goal of Therapy:  K 4.0- 5 Mg >/=2 All other electrolytes WNL  Plan:  Electrolytes stable. Phos and potassium on upper end of normal. No replacement indicated. Continue to follow along.   Pricilla Riffle ,PharmD Clinical Pharmacist 06/07/2020 3:35 PM

## 2020-06-07 NOTE — Progress Notes (Signed)
CRITICAL CARE NOTE  54 M hx of Graves dz, smoker, alcoholism came in due to tonic-clonic seizure with post-ictal state. Initially admitted to [facility] on medical floor and treated for electrolyte derrangements, Delerium tremens.  Patient failed CIWA protocol on floor with progressive symptoms of combative behavior with hyperactive delerium and upgraded to MICU with PCCM consult for further management of severe DTs.   06/06/20- patient very sensitive to precedex it was initiated on admission but he immediately became hypotensive and bradycardic have dcd this therapy for now and utilizing alternate agent.   10/18 remains on pressors, severe DT's    CC  follow up respiratory failure  SUBJECTIVE Patient remains critically ill Prognosis is guarded High risk for intubation    BP 101/71   Pulse 70   Temp 97.8 F (36.6 C) (Axillary)   Resp 13   Ht 5\' 10"  (1.778 m)   Wt 59.7 kg   SpO2 100%   BMI 18.88 kg/m    I/O last 3 completed shifts: In: 2416.5 [I.V.:2234.6; IV Piggyback:181.8] Out: 1900 [Urine:1900] No intake/output data recorded.  SpO2: 100 %  Estimated body mass index is 18.88 kg/m as calculated from the following:   Height as of this encounter: 5\' 10"  (1.778 m).   Weight as of this encounter: 59.7 kg.  SIGNIFICANT EVENTS   REVIEW OF SYSTEMS  PATIENT IS UNABLE TO PROVIDE COMPLETE REVIEW OF SYSTEMS DUE TO SEVERE CRITICAL ILLNESS        PHYSICAL EXAMINATION:  GENERAL:critically ill appearing,  HEAD: Normocephalic, atraumatic.  EYES: Pupils equal, round, reactive to light.  No scleral icterus.  MOUTH: Moist mucosal membrane. NECK: Supple.  PULMONARY: +rhonchi,  CARDIOVASCULAR: S1 and S2. Regular rate and rhythm. No murmurs, rubs, or gallops.  GASTROINTESTINAL: Soft, nontender, -distended.  Positive bowel sounds.   MUSCULOSKELETAL: No swelling, clubbing, or edema.  NEUROLOGIC: severe delirium SKIN:intact,warm,dry  MEDICATIONS: I have reviewed all medications  and confirmed regimen as documented   CULTURE RESULTS   Recent Results (from the past 240 hour(s))  Respiratory Panel by RT PCR (Flu A&B, Covid) - Nasopharyngeal Swab     Status: None   Collection Time: 06/04/20  9:17 PM   Specimen: Nasopharyngeal Swab  Result Value Ref Range Status   SARS Coronavirus 2 by RT PCR NEGATIVE NEGATIVE Final    Comment: (NOTE) SARS-CoV-2 target nucleic acids are NOT DETECTED.  The SARS-CoV-2 RNA is generally detectable in upper respiratoy specimens during the acute phase of infection. The lowest concentration of SARS-CoV-2 viral copies this assay can detect is 131 copies/mL. A negative result does not preclude SARS-Cov-2 infection and should not be used as the sole basis for treatment or other patient management decisions. A negative result may occur with  improper specimen collection/handling, submission of specimen other than nasopharyngeal swab, presence of viral mutation(s) within the areas targeted by this assay, and inadequate number of viral copies (<131 copies/mL). A negative result must be combined with clinical observations, patient history, and epidemiological information. The expected result is Negative.  Fact Sheet for Patients:   Fact Sheet for Healthcare Providers:  06/06/20  This test is no t yet approved or cleared by the https://www.moore.com/ FDA and  has been authorized for detection and/or diagnosis of SARS-CoV-2 by FDA under an Emergency Use Authorization (EUA). This EUA will remain  in effect (meaning this test can be used) for the duration of the COVID-19 declaration under Section 564(b)(1) of the Act, 21 U.S.C. section 360bbb-3(b)(1), unless the authorization is  terminated or revoked sooner.     Influenza A by PCR NEGATIVE NEGATIVE Final   Influenza B by PCR NEGATIVE NEGATIVE Final    Comment: (NOTE) The Xpert Xpress SARS-CoV-2/FLU/RSV assay is intended  as an aid in  the diagnosis of influenza from Nasopharyngeal swab specimens and  should not be used as a sole basis for treatment. Nasal washings and  aspirates are unacceptable for Xpert Xpress SARS-CoV-2/FLU/RSV  testing.  Fact Sheet for Patients: https://www.moore.com/  Fact Sheet for Healthcare Providers: https://www.young.biz/  This test is not yet approved or cleared by the [place] FDA and  has been authorized for detection and/or diagnosis of SARS-CoV-2 by  FDA under an Emergency Use Authorization (EUA). This EUA will remain  in effect (meaning this test can be used) for the duration of the  Covid-19 declaration under Section 564(b)(1) of the Act, 21  U.S.C. section 360bbb-3(b)(1), unless the authorization is  terminated or revoked. Performed at [facility], [address] [place], [place] [address]   MRSA PCR Screening     Status: None   Collection Time: 06/06/20 12:45 PM   Specimen: Nasopharyngeal  Result Value Ref Range Status   MRSA by PCR NEGATIVE NEGATIVE Final    Comment:        The GeneXpert MRSA Assay (FDA approved for NASAL specimens only), is one component of a comprehensive MRSA colonization surveillance program. It is not intended to diagnose MRSA infection nor to guide or monitor treatment for MRSA infections. Performed at [facility], [address] [place], [place] [address]       CMP Latest Ref Rng & Units 06/07/2020 06/06/2020 06/05/2020  Glucose 70 - 99 mg/dL 244(L) 93 93  BUN 6 - 20 mg/dL <7(N) <3(Y) 5(L)  Creatinine 0.61 - 1.24 mg/dL 0.51 1.02(T) 1.17  Sodium 135 - 145 mmol/L 134(L) 135 135  Potassium 3.5 - 5.1 mmol/L 5.0 3.9 3.2(L)  Chloride 98 - 111 mmol/L 101 99 100  CO2 22 - 32 mmol/L 22 26 24   Calcium 8.9 - 10.3 mg/dL 9.1 8.0(L) 6.9(L)  Total Protein 6.5 - 8.1 g/dL 5.9(L) 6.2(L) 5.9(L)  Total Bilirubin 0.3 - 1.2 mg/dL 3.2(H) 3.4(H) 3.4(H)  Alkaline Phos 38 - 126 U/L  229(H) 293(H) 266(H)  AST 15 - 41 U/L 173(H) 308(H) 242(H)  ALT 0 - 44 U/L 51(H) 65(H) 52(H)   CBC    Component Value Date/Time   WBC 4.5 06/07/2020 0659   RBC 2.40 (L) 06/07/2020 0659   HGB 8.8 (L) 06/07/2020 0659   HCT 26.2 (L) 06/07/2020 0659   PLT 39 (L) 06/07/2020 0659   MCV 109.2 (H) 06/07/2020 0659   MCH 36.7 (H) 06/07/2020 0659   MCHC 33.6 06/07/2020 0659   RDW 16.6 (H) 06/07/2020 0659   LYMPHSABS 0.9 06/07/2020 0659   MONOABS 0.2 06/07/2020 0659   EOSABS 0.0 06/07/2020 0659   BASOSABS 0.0 06/07/2020 0659   Anti-infectives (From admission, onward)   None        ASSESSMENT AND PLAN SYNOPSIS  34 yo male with severe ETOH abuse with severe DT's with hypovolumic shock and dehydration, with ETOH hepatitis HYPOKALEMIA high risk for aspiration pneumonia and high risk for intubation    NEUROLOGY Acute toxic metabolic encephalopathy due to severe DT's Aggressive CIWA protocol precedex if needed    [facility] Aggressive IV hydration -use vasopressors to keep MAP>65 as needed -follow ABG and LA -follow up cultures -emperic ABX -aggressive IV fluid resuscitation  CARDIAC ICU monitoring  ID -follow up cultures  GI GI PROPHYLAXIS as indicated  DIET-->NPO Constipation protocol as indicated  ENDO - will use ICU hypoglycemic\Hyperglycemia protocol if indicated     ELECTROLYTES -follow labs as needed -replace as needed -pharmacy consultation and following   DVT/GI PRX ordered and assessed TRANSFUSIONS AS NEEDED MONITOR FSBS I Assessed the need for Labs I Assessed the need for Foley I Assessed the need for Central Venous Line Family Discussion when available I Assessed the need for Mobilization I made an Assessment of medications to be adjusted accordingly Safety Risk assessment completed   CASE DISCUSSED IN MULTIDISCIPLINARY ROUNDS WITH ICU TEAM  Critical Care Time devoted to patient care services described in this note is 75 minutes.    Overall, patient is critically ill, prognosis is guarded.  Patient with Multiorgan failure and at high risk for cardiac arrest and death.    Lucie Leather, M.D.  [facility]  Medical Director [facility] Medical Director [facility] Cardio-Pulmonary Department

## 2020-06-07 NOTE — Progress Notes (Signed)
eeg completed ° °

## 2020-06-08 LAB — CBC WITH DIFFERENTIAL/PLATELET
Abs Immature Granulocytes: 0.11 10*3/uL — ABNORMAL HIGH (ref 0.00–0.07)
Basophils Absolute: 0.1 10*3/uL (ref 0.0–0.1)
Basophils Relative: 1 %
Eosinophils Absolute: 0.1 10*3/uL (ref 0.0–0.5)
Eosinophils Relative: 1 %
HCT: 29.3 % — ABNORMAL LOW (ref 39.0–52.0)
Hemoglobin: 10.1 g/dL — ABNORMAL LOW (ref 13.0–17.0)
Immature Granulocytes: 2 %
Lymphocytes Relative: 26 %
Lymphs Abs: 1.3 10*3/uL (ref 0.7–4.0)
MCH: 37.3 pg — ABNORMAL HIGH (ref 26.0–34.0)
MCHC: 34.5 g/dL (ref 30.0–36.0)
MCV: 108.1 fL — ABNORMAL HIGH (ref 80.0–100.0)
Monocytes Absolute: 0.4 10*3/uL (ref 0.1–1.0)
Monocytes Relative: 7 %
Neutro Abs: 3 10*3/uL (ref 1.7–7.7)
Neutrophils Relative %: 63 %
Platelets: 52 10*3/uL — ABNORMAL LOW (ref 150–400)
RBC: 2.71 MIL/uL — ABNORMAL LOW (ref 4.22–5.81)
RDW: 17.2 % — ABNORMAL HIGH (ref 11.5–15.5)
WBC: 4.9 10*3/uL (ref 4.0–10.5)
nRBC: 0.4 % — ABNORMAL HIGH (ref 0.0–0.2)

## 2020-06-08 LAB — COMPREHENSIVE METABOLIC PANEL
ALT: 45 U/L — ABNORMAL HIGH (ref 0–44)
AST: 132 U/L — ABNORMAL HIGH (ref 15–41)
Albumin: 2.9 g/dL — ABNORMAL LOW (ref 3.5–5.0)
Alkaline Phosphatase: 212 U/L — ABNORMAL HIGH (ref 38–126)
Anion gap: 11 (ref 5–15)
BUN: <5 mg/dL — ABNORMAL LOW (ref 6–20)
CO2: 23 mmol/L (ref 22–32)
Calcium: 8.9 mg/dL (ref 8.9–10.3)
Chloride: 104 mmol/L (ref 98–111)
Creatinine, Ser: 0.6 mg/dL — ABNORMAL LOW (ref 0.61–1.24)
GFR, Estimated: >60 mL/min (ref >60)
Glucose, Bld: 69 mg/dL — ABNORMAL LOW (ref 70–99)
Potassium: 4.1 mmol/L (ref 3.5–5.1)
Sodium: 138 mmol/L (ref 135–145)
Total Bilirubin: 2.4 mg/dL — ABNORMAL HIGH (ref 0.3–1.2)
Total Protein: 5.5 g/dL — ABNORMAL LOW (ref 6.5–8.1)

## 2020-06-08 LAB — GLUCOSE, CAPILLARY
Glucose-Capillary: 86 mg/dL (ref 70–99)
Glucose-Capillary: 88 mg/dL (ref 70–99)

## 2020-06-08 LAB — MAGNESIUM: Magnesium: 1.2 mg/dL — ABNORMAL LOW (ref 1.7–2.4)

## 2020-06-08 LAB — PHOSPHORUS: Phosphorus: 4.3 mg/dL (ref 2.5–4.6)

## 2020-06-08 MED ORDER — MAGNESIUM SULFATE 4 GM/100ML IV SOLN
4.0000 g | Freq: Once | INTRAVENOUS | Status: AC
  Administered 2020-06-08: 4 g via INTRAVENOUS
  Filled 2020-06-08: qty 100

## 2020-06-08 NOTE — Consult Note (Signed)
PHARMACY CONSULT NOTE  Pharmacy Consult for Electrolyte Monitoring and Replacement   Recent Labs: Potassium (mmol/L)  Date Value  06/08/2020 4.1   Magnesium (mg/dL)  Date Value  60/60/0459 1.2 (L)   Calcium (mg/dL)  Date Value  97/74/1423 8.9   Albumin (g/dL)  Date Value  95/32/0233 2.9 (L)   Phosphorus (mg/dL)  Date Value  43/56/8616 4.3   Sodium (mmol/L)  Date Value  06/08/2020 138     Assessment: Pharmacy has been consulted for electrolyte replacement in a patient who presented with acute onset seizures. Presently the patient is here with following issues: severe delirium tremens, acute alcoholic hepatitis, severe thrombocytopenia, and severe protein calorie malnutrition.    Goal of Therapy:  K 4.0- 5 Mg >/=2 All other electrolytes WNL  Plan:  Mag 4 g IV x 1. Recheck electrolytes with morning labs. Continue to follow along.   Pricilla Riffle ,PharmD Clinical Pharmacist 06/08/2020 3:04 PM

## 2020-06-08 NOTE — Progress Notes (Signed)
CRITICAL CARE NOTE 37M hx of Graves dz, smoker, alcoholism came in due to tonic-clonic seizure with post-ictal state. Initially admitted to [facility] on medical floor and treated for electrolyte derrangements, Delerium tremens. Patient failed CIWA protocol on floor with progressive symptoms of combative behavior with hyperactive delerium and upgraded to [facility] with [facility] consult for further management of severe DTs.   06/06/20- patient very sensitive to precedexit was initiated on admission but he immediately became hypotensive and bradycardic have dcd this therapy for now and utilizing alternate agent. 10/18 remains on pressors, severe DT's 10/19 severe DT's    CC  follow up respiratory failure  SUBJECTIVE Patient remains critically ill Prognosis is guarded Brain injury from DT's High risk for cardiac arrest/intubation   BP 102/85    Pulse 76    Temp 97.7 F (36.5 C) (Axillary)    Resp 13    Ht 5\' 10"  (1.778 m)    Wt 59.7 kg    SpO2 100%    BMI 18.88 kg/m    I/O last 3 completed shifts: In: 3451 [P.O.:237; I.V.:260.3; IV Piggyback:2953.8] Out: 6850 [Urine:6850] No intake/output data recorded.  SpO2: 100 %  Estimated body mass index is 18.88 kg/m as calculated from the following:   Height as of this encounter: 5\' 10"  (1.778 m).   Weight as of this encounter: 59.7 kg.  SIGNIFICANT EVENTS   REVIEW OF SYSTEMS  PATIENT IS UNABLE TO PROVIDE COMPLETE REVIEW OF SYSTEMS DUE TO SEVERE CRITICAL ILLNESS        PHYSICAL EXAMINATION:  GENERAL:critically ill appearing NECK: Supple.  PULMONARY: +rhonchi,  CARDIOVASCULAR: S1 and S2. Regular rate and rhythm. No murmurs, rubs, or gallops.  GASTROINTESTINAL: Soft, nontender, -distended.  Positive bowel sounds.   MUSCULOSKELETAL: No swelling, clubbing, or edema.  NEUROLOGIC: delerious SKIN:intact,warm,dry  MEDICATIONS: I have reviewed all medications and confirmed regimen as documented   CULTURE RESULTS   Recent Results (from  the past 240 hour(s))  Respiratory Panel by RT PCR (Flu A&B, Covid) - Nasopharyngeal Swab     Status: None   Collection Time: 06/04/20  9:17 PM   Specimen: Nasopharyngeal Swab  Result Value Ref Range Status   SARS Coronavirus 2 by RT PCR NEGATIVE NEGATIVE Final    Comment: (NOTE) SARS-CoV-2 target nucleic acids are NOT DETECTED.  The SARS-CoV-2 RNA is generally detectable in upper respiratoy specimens during the acute phase of infection. The lowest concentration of SARS-CoV-2 viral copies this assay can detect is 131 copies/mL. A negative result does not preclude SARS-Cov-2 infection and should not be used as the sole basis for treatment or other patient management decisions. A negative result may occur with  improper specimen collection/handling, submission of specimen other than nasopharyngeal swab, presence of viral mutation(s) within the areas targeted by this assay, and inadequate number of viral copies (<131 copies/mL). A negative result must be combined with clinical observations, patient history, and epidemiological information. The expected result is Negative.  Fact Sheet for Patients:   Fact Sheet for Healthcare Providers:  06/06/20  This test is no t yet approved or cleared by the https://www.moore.com/ FDA and  has been authorized for detection and/or diagnosis of SARS-CoV-2 by FDA under an Emergency Use Authorization (EUA). This EUA will remain  in effect (meaning this test can be used) for the duration of the COVID-19 declaration under Section 564(b)(1) of the Act, 21 U.S.C. section 360bbb-3(b)(1), unless the authorization is terminated or revoked sooner.     Influenza A by PCR NEGATIVE  NEGATIVE Final   Influenza B by PCR NEGATIVE NEGATIVE Final    Comment: (NOTE) The Xpert Xpress SARS-CoV-2/FLU/RSV assay is intended as an aid in  the diagnosis of influenza from Nasopharyngeal swab specimens and   should not be used as a sole basis for treatment. Nasal washings and  aspirates are unacceptable for Xpert Xpress SARS-CoV-2/FLU/RSV  testing.  Fact Sheet for Patients: https://www.moore.com/  Fact Sheet for Healthcare Providers: https://www.young.biz/  This test is not yet approved or cleared by the [place] FDA and  has been authorized for detection and/or diagnosis of SARS-CoV-2 by  FDA under an Emergency Use Authorization (EUA). This EUA will remain  in effect (meaning this test can be used) for the duration of the  Covid-19 declaration under Section 564(b)(1) of the Act, 21  U.S.C. section 360bbb-3(b)(1), unless the authorization is  terminated or revoked. Performed at [facility], [address] [place], [place] [address]   [facility]     Status: None   Collection Time: 06/06/20 12:45 PM   Specimen: Nasopharyngeal  Result Value Ref Range Status   Joshua by PCR NEGATIVE NEGATIVE Final    Comment:        The GeneXpert Joshua Assay (FDA approved for NASAL specimens only), is one component of a comprehensive Joshua colonization surveillance program. It is not intended to diagnose Joshua infection nor to guide or monitor treatment for Joshua infections. Performed at [facility], [address] [place] [place], [place] [address]           IMAGING    [facility]  Result Date: 06/07/2020 [name], MD     06/07/2020  2:11 PM Patient Name: [facility] MRN: [id number] Epilepsy Attending: [name] Referring Physician/Provider: Dr. Valente David Date: 06/07/2020 Duration: 24.53 minutes Patient history: 34 year old male with history of alcoholism who presented with generalized tonic-clonic seizure.  [facility] to evaluate for seizures. Level of alertness: Awake AEDs during [facility] study: Ativan Technical aspects: This [facility] study was done with scalp electrodes positioned according to the 10-20 International system of electrode  placement. Electrical activity was acquired at a sampling rate of 500Hz  and reviewed with a high frequency filter of 70Hz  and a low frequency filter of 1Hz . [facility] data were recorded continuously and digitally stored. Description: No clear posterior rhythm seen.  [facility] showed generalized generalized 2 to 3 Hz delta slowing with overriding 15 to 18 Hz generalized beta activity.  Hyperventilation and photic stimulation were not performed. Of note, there was significant movement artifact throughout the [facility]. ABNORMALITY -Continuous slow, generalized -Excessive beta, generalized IMPRESSION: This technically difficult study is suggestive of mild to moderate diffuse encephalopathy, nonspecific etiology but likely related to seizures, ativan use. No seizures or epileptiform discharges were seen throughout the recording.   CMP Latest Ref Rng & Units 06/08/2020 06/07/2020 06/06/2020  Glucose 70 - 99 mg/dL 06/10/2020) 06/09/2020) 93  BUN 6 - 20 mg/dL 06/08/2020) 93(G) 182(X)  Creatinine 0.61 - 1.24 mg/dL <9(B) <7(J <6(R)  Sodium 135 - 145 mmol/L 138 134(L) 135  Potassium 3.5 - 5.1 mmol/L 4.1 5.0 3.9  Chloride 98 - 111 mmol/L 104 101 99  CO2 22 - 32 mmol/L 23 22 26   Calcium 8.9 - 10.3 mg/dL 8.9 9.1 8.0(L)  Total Protein 6.5 - 8.1 g/dL 6.78(L) 5.9(L) 6.2(L)  Total Bilirubin 0.3 - 1.2 mg/dL 2.4(H) 3.2(H) 3.4(H)  Alkaline Phos 38 - 126 U/L 212(H) 229(H) 293(H)  AST 15 - 41 U/L 132(H) 173(H)  308(H)  ALT 0 - 44 U/L 45(H) 51(H) 65(H)        ASSESSMENT AND PLAN SYNOPSIS   34 yo male with severe ETOH abuse with severe DT's with hypovolumic shock and dehydration, with ETOH hepatitis HYPOKALEMIA high risk for aspiration pneumonia and high risk for intubation      NEUROLOGY Acute toxic metabolic encephalopathy-due to DT's   [facility] -use vasopressors to keep MAP>65 as needed ggressive IV fluid resuscitation  CARDIAC ICU monitoring  DIET-->NPO Constipation protocol as indicated  ENDO -  will use ICU hypoglycemic\Hyperglycemia protocol if indicated     ELECTROLYTES -follow labs as needed -replace as needed -pharmacy consultation and following   DVT/GI PRX ordered and assessed TRANSFUSIONS AS NEEDED MONITOR FSBS I Assessed the need for Labs I Assessed the need for Foley I Assessed the need for Central Venous Line Family Discussion when available I Assessed the need for Mobilization I made an Assessment of medications to be adjusted accordingly Safety Risk assessment completed   CASE DISCUSSED IN MULTIDISCIPLINARY ROUNDS WITH ICU TEAM  Critical Care Time devoted to patient care services described in this note is 45 minutes.   Overall, patient is critically ill, prognosis is guarded.   Lucie Leather, M.D.  [facility]  Medical Director [facility] Medical Director [facility] Cardio-Pulmonary Department

## 2020-06-09 LAB — GLUCOSE, CAPILLARY
Glucose-Capillary: 67 mg/dL — ABNORMAL LOW (ref 70–99)
Glucose-Capillary: 79 mg/dL (ref 70–99)
Glucose-Capillary: 126 mg/dL — ABNORMAL HIGH (ref 70–99)
Glucose-Capillary: 191 mg/dL — ABNORMAL HIGH (ref 70–99)
Glucose-Capillary: 123 mg/dL — ABNORMAL HIGH (ref 70–99)

## 2020-06-09 LAB — CBC WITH DIFFERENTIAL/PLATELET
Abs Immature Granulocytes: 0.17 10*3/uL — ABNORMAL HIGH (ref 0.00–0.07)
Basophils Absolute: 0.1 10*3/uL (ref 0.0–0.1)
Basophils Relative: 1 %
Eosinophils Absolute: 0.1 10*3/uL (ref 0.0–0.5)
Eosinophils Relative: 1 %
HCT: 29.5 % — ABNORMAL LOW (ref 39.0–52.0)
Hemoglobin: 10 g/dL — ABNORMAL LOW (ref 13.0–17.0)
Immature Granulocytes: 3 %
Lymphocytes Relative: 22 %
Lymphs Abs: 1.2 10*3/uL (ref 0.7–4.0)
MCH: 36.5 pg — ABNORMAL HIGH (ref 26.0–34.0)
MCHC: 33.9 g/dL (ref 30.0–36.0)
MCV: 107.7 fL — ABNORMAL HIGH (ref 80.0–100.0)
Monocytes Absolute: 0.5 10*3/uL (ref 0.1–1.0)
Monocytes Relative: 10 %
Neutro Abs: 3.6 10*3/uL (ref 1.7–7.7)
Neutrophils Relative %: 63 %
Platelets: 69 10*3/uL — ABNORMAL LOW (ref 150–400)
RBC: 2.74 MIL/uL — ABNORMAL LOW (ref 4.22–5.81)
RDW: 17.2 % — ABNORMAL HIGH (ref 11.5–15.5)
WBC: 5.6 10*3/uL (ref 4.0–10.5)
nRBC: 1.1 % — ABNORMAL HIGH (ref 0.0–0.2)

## 2020-06-09 LAB — COMPREHENSIVE METABOLIC PANEL
ALT: 48 U/L — ABNORMAL HIGH (ref 0–44)
AST: 122 U/L — ABNORMAL HIGH (ref 15–41)
Albumin: 2.9 g/dL — ABNORMAL LOW (ref 3.5–5.0)
Alkaline Phosphatase: 214 U/L — ABNORMAL HIGH (ref 38–126)
Anion gap: 10 (ref 5–15)
BUN: <5 mg/dL — ABNORMAL LOW (ref 6–20)
CO2: 25 mmol/L (ref 22–32)
Calcium: 8.5 mg/dL — ABNORMAL LOW (ref 8.9–10.3)
Chloride: 101 mmol/L (ref 98–111)
Creatinine, Ser: 0.62 mg/dL (ref 0.61–1.24)
GFR, Estimated: >60 mL/min (ref >60)
Glucose, Bld: 97 mg/dL (ref 70–99)
Potassium: 3.3 mmol/L — ABNORMAL LOW (ref 3.5–5.1)
Sodium: 136 mmol/L (ref 135–145)
Total Bilirubin: 2.4 mg/dL — ABNORMAL HIGH (ref 0.3–1.2)
Total Protein: 5.3 g/dL — ABNORMAL LOW (ref 6.5–8.1)

## 2020-06-09 LAB — MAGNESIUM: Magnesium: 1.3 mg/dL — ABNORMAL LOW (ref 1.7–2.4)

## 2020-06-09 LAB — PHOSPHORUS: Phosphorus: 5.1 mg/dL — ABNORMAL HIGH (ref 2.5–4.6)

## 2020-06-09 MED ORDER — MIDODRINE HCL 5 MG PO TABS
10.0000 mg | ORAL_TABLET | Freq: Three times a day (TID) | ORAL | Status: DC
  Administered 2020-06-09 – 2020-06-11 (×6): 10 mg via ORAL
  Filled 2020-06-09 (×6): qty 2

## 2020-06-09 MED ORDER — DOCUSATE SODIUM 100 MG PO CAPS
100.0000 mg | ORAL_CAPSULE | Freq: Two times a day (BID) | ORAL | Status: DC
  Administered 2020-06-09 – 2020-06-11 (×5): 100 mg via ORAL
  Filled 2020-06-09 (×5): qty 1

## 2020-06-09 MED ORDER — DEXTROSE 50 % IV SOLN
INTRAVENOUS | Status: AC
  Administered 2020-06-09: 12.5 g via INTRAVENOUS
  Filled 2020-06-09: qty 50

## 2020-06-09 MED ORDER — DEXTROSE 50 % IV SOLN
INTRAVENOUS | Status: AC
  Filled 2020-06-09: qty 50

## 2020-06-09 MED ORDER — DEXTROSE 50 % IV SOLN: 12.5000 g | Freq: Once | INTRAVENOUS | Status: AC

## 2020-06-09 MED ORDER — HEPARIN SODIUM (PORCINE) 5000 UNIT/ML IJ SOLN
5000.0000 [IU] | Freq: Three times a day (TID) | INTRAMUSCULAR | Status: DC
  Administered 2020-06-09 – 2020-06-10 (×4): 5000 [IU] via SUBCUTANEOUS
  Filled 2020-06-09 (×4): qty 1

## 2020-06-09 MED ORDER — POTASSIUM CHLORIDE 10 MEQ/100ML IV SOLN
10.0000 meq | INTRAVENOUS | Status: AC
  Administered 2020-06-09 (×4): 10 meq via INTRAVENOUS
  Filled 2020-06-09 (×4): qty 100

## 2020-06-09 MED ORDER — SODIUM CHLORIDE 0.9 % IV SOLN: 250.0000 mL | INTRAVENOUS | Status: DC

## 2020-06-09 MED ORDER — MAGNESIUM SULFATE 4 GM/100ML IV SOLN
4.0000 g | Freq: Once | INTRAVENOUS | Status: AC
  Administered 2020-06-09: 4 g via INTRAVENOUS
  Filled 2020-06-09: qty 100

## 2020-06-09 NOTE — Progress Notes (Addendum)
CRITICAL CARE NOTE 47M hx of Graves dz, smoker, alcoholism came in due to tonic-clonic seizure with post-ictal state. Initially admitted to [facility] on medical floor and treated for electrolyte derrangements, Delerium tremors. Patient failed CIWA protocol on floor with progressive symptoms of combative behavior with hyperactive delerium and upgraded to MICU with PCCM consult for further management of severe DTs.    06/06/20- patient very sensitive to precedexit was initiated on admission but he immediately became hypotensive and bradycardic have dcd this therapy for now and utilizing alternate agent. 10/18 remains on pressors, severe DT's 10/19 severe DT's 10/20 Continues to hallucinate w/intermittent agitation, oriented to self and year, tolerating CIWA protocol. No precedex  CC  Alcohol withdraw/DTs  SUBJECTIVE Tmax 98 I&Os +821cc No acute events overnight   BP 93/71   Pulse 74   Temp 97.6 F (36.4 C) (Oral)   Resp 11   Ht 5\' 10"  (1.778 m)   Wt 59.7 kg   SpO2 100%   BMI 18.88 kg/m    I/O last 3 completed shifts: In: 4396.1 [I.V.:4230.9; IV Piggyback:165.3] Out: 6075 [Urine:6075] No intake/output data recorded.  SpO2: 100 %  Estimated body mass index is 18.88 kg/m as calculated from the following:   Height as of this encounter: 5\' 10"  (1.778 m).   Weight as of this encounter: 59.7 kg.  SIGNIFICANT EVENTS: --No significant events overnight   REVIEW OF SYSTEMS Unable to assess due to clinical condition   PHYSICAL EXAMINATION:  GENERAL:critically ill appearing NECK: Supple.  PULMONARY: +rhonchi,  CARDIOVASCULAR: S1 and S2. Regular rate and rhythm. No murmurs, rubs, or gallops.  GASTROINTESTINAL: Soft, nontender, -distended.  Positive bowel sounds.   MUSCULOSKELETAL: No swelling, clubbing, or edema.  NEUROLOGIC: Hallucinating, oriented to  SKIN:intact,warm,dry  MEDICATIONS: I have reviewed all medications and confirmed regimen as documented   CULTURE  RESULTS   Recent Results (from the past 240 hour(s))  Respiratory Panel by RT PCR (Flu A&B, Covid) - Nasopharyngeal Swab     Status: None   Collection Time: 06/04/20  9:17 PM   Specimen: Nasopharyngeal Swab  Result Value Ref Range Status   SARS Coronavirus 2 by RT PCR NEGATIVE NEGATIVE Final    Comment: (NOTE) SARS-CoV-2 target nucleic acids are NOT DETECTED.  The SARS-CoV-2 RNA is generally detectable in upper respiratoy specimens during the acute phase of infection. The lowest concentration of SARS-CoV-2 viral copies this assay can detect is 131 copies/mL. A negative result does not preclude SARS-Cov-2 infection and should not be used as the sole basis for treatment or other patient management decisions. A negative result may occur with  improper specimen collection/handling, submission of specimen other than nasopharyngeal swab, presence of viral mutation(s) within the areas targeted by this assay, and inadequate number of viral copies (<131 copies/mL). A negative result must be combined with clinical observations, patient history, and epidemiological information. The expected result is Negative.  Fact Sheet for Patients:   Fact Sheet for Healthcare Providers:  06/06/20  This test is no t yet approved or cleared by the https://www.moore.com/ FDA and  has been authorized for detection and/or diagnosis of SARS-CoV-2 by FDA under an Emergency Use Authorization (EUA). This EUA will remain  in effect (meaning this test can be used) for the duration of the COVID-19 declaration under Section 564(b)(1) of the Act, 21 U.S.C. section 360bbb-3(b)(1), unless the authorization is terminated or revoked sooner.     Influenza A by PCR NEGATIVE NEGATIVE Final   Influenza B by PCR NEGATIVE  NEGATIVE Final    Comment: (NOTE) The Xpert Xpress SARS-CoV-2/FLU/RSV assay is intended as an aid in  the diagnosis of influenza from  Nasopharyngeal swab specimens and  should not be used as a sole basis for treatment. Nasal washings and  aspirates are unacceptable for Xpert Xpress SARS-CoV-2/FLU/RSV  testing.  Fact Sheet for Patients: https://www.moore.com/  Fact Sheet for Healthcare Providers: https://www.young.biz/  This test is not yet approved or cleared by the [place] FDA and  has been authorized for detection and/or diagnosis of SARS-CoV-2 by  FDA under an Emergency Use Authorization (EUA). This EUA will remain  in effect (meaning this test can be used) for the duration of the  Covid-19 declaration under Section 564(b)(1) of the Act, 21  U.S.C. section 360bbb-3(b)(1), unless the authorization is  terminated or revoked. Performed at [facility], [address] [place], [place] [address]   MRSA PCR Screening     Status: None   Collection Time: 06/06/20 12:45 PM   Specimen: Nasopharyngeal  Result Value Ref Range Status   MRSA by PCR NEGATIVE NEGATIVE Final    Comment:        The GeneXpert MRSA Assay (FDA approved for NASAL specimens only), is one component of a comprehensive MRSA colonization surveillance program. It is not intended to diagnose MRSA infection nor to guide or monitor treatment for MRSA infections. Performed at [facility], [address], [place], [place] [address]           IMAGING    No results found. CMP Latest Ref Rng & Units 06/09/2020 06/08/2020 06/07/2020  Glucose 70 - 99 mg/dL 97 40(Z) 709(U)  BUN 6 - 20 mg/dL <4(R) <8(V) <8(F)  Creatinine 0.61 - 1.24 mg/dL 8.40 3.75(O) 3.60  Sodium 135 - 145 mmol/L 136 138 134(L)  Potassium 3.5 - 5.1 mmol/L 3.3(L) 4.1 5.0  Chloride 98 - 111 mmol/L 101 104 101  CO2 22 - 32 mmol/L 25 23 22   Calcium 8.9 - 10.3 mg/dL ) 8.9 9.1  Total Protein 6.5 - 8.1 g/dL 5.3(L) 5.5(L) 5.9(L)  Total Bilirubin 0.3 - 1.2 mg/dL 2.4(H) 2.4(H) 3.2(H)  Alkaline Phos 38 - 126 U/L  214(H) 212(H) 229(H)  AST 15 - 41 U/L 122(H) 132(H) 173(H)  ALT 0 - 44 U/L 48(H) 45(H) 51(H)        ASSESSMENT AND PLAN  Acute toxic metabolic encephalopathy 2/2 Alcohol withdraw --Continue CIWA protocol --Completed thiamine, continue folic acid and multivitamin  Hypotension --Improving --Remains on low dose levophed at 6.7(P --Continue IVFs at 150cc/hr --Afebrile, normal WBC  Hypoglycemia --Blood sugar 59 this AM --Received PRN D50 --Encourage PO intake today --May require D5 gtt  Hx of Graves Disease --Continue home synthroid   Prophylaxis --DVT: SCDs, Heparin --GI: Not indicated --Bowel: Colace   FEN: --Continue MIVFs until taking PO well --Replace as needed, received K this AM for K of 3.3  Family Engagement: No family at bedside, available if they arrive Code Status: Full Code Dispo: To remain in ICU today    June ACNP-BC  PCCM ATTENDING ATTESTATION:  I have evaluated patient with the APP, I personally  reviewed database in its entirety and discussed care plan in detail. In addition, this patient was discussed on multidisciplinary rounds.   I agree with assessment and plan.  DT's somewhat improving Does NOT require precedex Weaning off levophed infusions +hallucinations respodning CIWA protocol Advance diet as tolerted  Transfer back to [facility] 10/21     11/21,  M.D.  [facility]  Medical Director [place] Director [facility] Cardio-Pulmonary Department

## 2020-06-09 NOTE — Care Management (Signed)
This is a no charge note  Pick up from PCCM, Dr. Belia Heman  34 year old man with a past medical history of Graves' disease, hypothyroidism, alcohol abuse, who was admitted to ICU due to seizure and DT.  Patient was sensitive to Precedex which caused hypotension.  Patient was started on vasopressor, currently pt is off pressor, stabilized.  We need to pick up this patient tomorrow morning.      Lorretta Harp, MD  [facility]   If 7PM-7AM, please contact night-coverage [email or web address] 06/09/2020, 11:33 AM

## 2020-06-09 NOTE — Consult Note (Signed)
PHARMACY CONSULT NOTE  Pharmacy Consult for Electrolyte Monitoring and Replacement   Recent Labs: Potassium (mmol/L)  Date Value  06/09/2020 3.3 (L)   Magnesium (mg/dL)  Date Value  73/53/2992 1.3 (L)   Calcium (mg/dL)  Date Value  42/68/3419 8.5 (L)   Albumin (g/dL)  Date Value  62/22/9798 2.9 (L)   Phosphorus (mg/dL)  Date Value  92/06/9416 5.1 (H)   Sodium (mmol/L)  Date Value  06/09/2020 136     Assessment: Pharmacy has been consulted for electrolyte replacement in a patient who presented with acute onset seizures. Presently the patient is here with following issues: severe delirium tremens, acute alcoholic hepatitis, severe thrombocytopenia, and severe protein calorie malnutrition.    Goal of Therapy:  K 4.0- 5 Mg >/=2 All other electrolytes WNL  Plan:  Repeat mag 4 g IV x 1. Potassium 10 mEq IV x 4. Recheck electrolytes with morning labs. Continue to follow along.   Pricilla Riffle ,PharmD Clinical Pharmacist 06/09/2020 11:30 AM

## 2020-06-10 DIAGNOSIS — I9589 Other hypotension: Secondary | ICD-10-CM

## 2020-06-10 DIAGNOSIS — I959 Hypotension, unspecified: Secondary | ICD-10-CM

## 2020-06-10 DIAGNOSIS — E162 Hypoglycemia, unspecified: Secondary | ICD-10-CM

## 2020-06-10 LAB — CBC WITH DIFFERENTIAL/PLATELET
Abs Immature Granulocytes: 0.21 10*3/uL — ABNORMAL HIGH (ref 0.00–0.07)
Basophils Absolute: 0.1 10*3/uL (ref 0.0–0.1)
Basophils Relative: 1 %
Eosinophils Absolute: 0 10*3/uL (ref 0.0–0.5)
Eosinophils Relative: 1 %
HCT: 28.4 % — ABNORMAL LOW (ref 39.0–52.0)
Hemoglobin: 9.7 g/dL — ABNORMAL LOW (ref 13.0–17.0)
Immature Granulocytes: 4 %
Lymphocytes Relative: 31 %
Lymphs Abs: 1.7 10*3/uL (ref 0.7–4.0)
MCH: 37.5 pg — ABNORMAL HIGH (ref 26.0–34.0)
MCHC: 34.2 g/dL (ref 30.0–36.0)
MCV: 109.7 fL — ABNORMAL HIGH (ref 80.0–100.0)
Monocytes Absolute: 0.8 10*3/uL (ref 0.1–1.0)
Monocytes Relative: 15 %
Neutro Abs: 2.7 10*3/uL (ref 1.7–7.7)
Neutrophils Relative %: 48 %
Platelets: 70 10*3/uL — ABNORMAL LOW (ref 150–400)
RBC: 2.59 MIL/uL — ABNORMAL LOW (ref 4.22–5.81)
RDW: 17.7 % — ABNORMAL HIGH (ref 11.5–15.5)
WBC: 5.4 10*3/uL (ref 4.0–10.5)
nRBC: 1.7 % — ABNORMAL HIGH (ref 0.0–0.2)

## 2020-06-10 LAB — COMPREHENSIVE METABOLIC PANEL
ALT: 49 U/L — ABNORMAL HIGH (ref 0–44)
AST: 122 U/L — ABNORMAL HIGH (ref 15–41)
Albumin: 2.8 g/dL — ABNORMAL LOW (ref 3.5–5.0)
Alkaline Phosphatase: 238 U/L — ABNORMAL HIGH (ref 38–126)
Anion gap: 9 (ref 5–15)
BUN: <5 mg/dL — ABNORMAL LOW (ref 6–20)
CO2: 25 mmol/L (ref 22–32)
Calcium: 8.4 mg/dL — ABNORMAL LOW (ref 8.9–10.3)
Chloride: 104 mmol/L (ref 98–111)
Creatinine, Ser: 0.59 mg/dL — ABNORMAL LOW (ref 0.61–1.24)
GFR, Estimated: >60 mL/min (ref >60)
Glucose, Bld: 97 mg/dL (ref 70–99)
Potassium: 3.7 mmol/L (ref 3.5–5.1)
Sodium: 138 mmol/L (ref 135–145)
Total Bilirubin: 1.6 mg/dL — ABNORMAL HIGH (ref 0.3–1.2)
Total Protein: 5.2 g/dL — ABNORMAL LOW (ref 6.5–8.1)

## 2020-06-10 LAB — MAGNESIUM
Magnesium: 1.4 mg/dL — ABNORMAL LOW (ref 1.7–2.4)
Magnesium: 1.9 mg/dL (ref 1.7–2.4)

## 2020-06-10 LAB — GLUCOSE, CAPILLARY
Glucose-Capillary: 135 mg/dL — ABNORMAL HIGH (ref 70–99)
Glucose-Capillary: 170 mg/dL — ABNORMAL HIGH (ref 70–99)
Glucose-Capillary: 84 mg/dL (ref 70–99)
Glucose-Capillary: 97 mg/dL (ref 70–99)

## 2020-06-10 LAB — PHOSPHORUS: Phosphorus: 4.3 mg/dL (ref 2.5–4.6)

## 2020-06-10 LAB — VITAMIN B12: Vitamin B-12: 618 pg/mL (ref 180–914)

## 2020-06-10 MED ORDER — THIAMINE HCL 100 MG PO TABS
100.0000 mg | ORAL_TABLET | Freq: Every day | ORAL | Status: DC
  Administered 2020-06-10 – 2020-06-11 (×2): 100 mg via ORAL
  Filled 2020-06-10 (×2): qty 1

## 2020-06-10 MED ORDER — MAGNESIUM SULFATE 4 GM/100ML IV SOLN
4.0000 g | Freq: Once | INTRAVENOUS | Status: AC
  Administered 2020-06-10: 4 g via INTRAVENOUS
  Filled 2020-06-10: qty 100

## 2020-06-10 NOTE — Progress Notes (Signed)
PROGRESS NOTE    Joshua Medina  ZHG:[id number] DOB: Mar 17, 1986 DOA: 06/04/2020 PCP: Patient, No Pcp Per   Follow-up for alcohol withdrawal  Brief Narrative:  68M hx of Graves dz, smoker, alcoholism came in due to tonic-clonic seizure with post-ictal state. Initially admitted to [facility] on medical floor and treated for electrolyte derrangements, Delerium tremors. Patient failed CIWA protocol on floor with progressive symptoms of combative behavior with hyperactive delerium and upgraded to MICU with PCCM consult for further management of severe DTs.    06/06/20- patient very sensitive to precedexit was initiated on admission but he immediately became hypotensive and bradycardic have dcd this therapy for now and utilizing alternate agent. 10/18 remains on pressors, severe DT's 10/19 severe DT's 10/20 Continues to hallucinate w/intermittent agitation, oriented to self and year, tolerating CIWA protocol. No precedex   Assessment & Plan:   Active Problems:   Alcohol withdrawal seizure (HCC)  #1.  Alcohol withdrawal seizure with acute metabolic encephalopathy. Patient condition seem to be improving, currently he no longer has any confusion or agitation. Continue oral thiamine and folic acid.  Continue CIWA protocol.  2.  Delirium treatments. Condition also improved.  3.  Hypotension. Patient was on Levophed, condition had improved, I will discontinue IV fluids.  4.  Hypoglycemia Condition has been improving.  5.  Hypomagnesemia. Supplemented yesterday, recheck level again tomorrow.  6.  Macrocytic anemia. Secondary to alcohol, check a B12 level.  7.  Alcoholic hepatitis. Advised to quit.  8.  Thrombocytopenia. Secondary to alcohol liver disease.  Discontinue heparin prophylaxis.    DVT prophylaxis: SCDs Code Status: Full Family Communication: None     Status is: Inpatient  Remains inpatient appropriate because:Inpatient level of care appropriate due to severity  of illness   Dispo:  Patient From: Home  Planned Disposition: Home with Health Care Svc  Expected discharge date: 06/10/20  Medically stable for discharge: No         I/O last 3 completed shifts: In: 5095 [I.V.:5095] Out: 2875 [Urine:2875] Total I/O In: 501.8 [I.V.:401.8; IV Piggyback:100] Out: 400 [Urine:400]     Consultants:   None  Procedures: None  Antimicrobials: None  Subjective: Patient still has some disorientation today.  But no agitation.  Blood pressure has been better.  Appetite improving, no nausea vomiting abdominal pain. No short of breath or cough. No fever or chills. No dysuria hematuria.  Objective: Vitals:   06/10/20 0900 06/10/20 1000 06/10/20 1100 06/10/20 1200  BP:  (!) 108/95  106/76  Pulse:   74   Resp: (!) 22 10 12 10   Temp:      TempSrc:      SpO2:   100%   Weight:      Height:        Intake/Output Summary (Last 24 hours) at 06/10/2020 1304 Last data filed at 06/10/2020 1153 Gross per 24 hour  Intake 3918.16 ml  Output 1600 ml  Net 2318.16 ml   Filed Weights   06/04/20 1939 06/06/20 1239  Weight: 58.2 kg 59.7 kg    Examination:  General exam: Appears calm and comfortable  Respiratory system: Clear to auscultation. Respiratory effort normal. Cardiovascular system: S1 & S2 heard, RRR. No JVD, murmurs, rubs, gallops or clicks. No pedal edema. Gastrointestinal system: Abdomen is nondistended, soft and nontender. No organomegaly or masses felt. Normal bowel sounds heard. Central nervous system: Alert and oriented x2. No focal neurological deficits. Extremities: Symmetric  Skin: No rashes, lesions or ulcers Psychiatry:  Mood & affect appropriate.  Data Reviewed: I have personally reviewed following labs and imaging studies  CBC: Recent Labs  Lab 06/06/20 0429 06/07/20 0659 06/08/20 0831 06/09/20 0441 06/10/20 0429  WBC 5.3 4.5 4.9 5.6 5.4  NEUTROABS 3.2 3.3 3.0 3.6 2.7  HGB 10.1* 8.8* 10.1* 10.0* 9.7*  HCT  28.9* 26.2* 29.3* 29.5* 28.4*  MCV 106.6* 109.2* 108.1* 107.7* 109.7*  PLT 37* 39* 52* 69* 70*   Basic Metabolic Panel: Recent Labs  Lab 06/04/20 1935 06/06/20 0429 06/06/20 0429 06/06/20 1632 06/07/20 0659 06/08/20 0456 06/09/20 0441 06/10/20 0429 06/10/20 1156  NA  --  135  --   --  134* 138 136 138  --   K  --  3.9  --   --  5.0 4.1 3.3* 3.7  --   CL  --  99  --   --  101 104 101 104  --   CO2  --  26  --   --  22 23 25 25   --   GLUCOSE  --  93  --   --  108* 69* 97 97  --   BUN  --  <5*  --   --  <5* <5* <5* <5*  --   CREATININE  --  0.55*  --   --  0.68 0.60* 0.62 0.59*  --   CALCIUM  --  8.0*  --   --  9.1 8.9 8.5* 8.4*  --   MG  --  2.2   < >  --  1.8 1.2* 1.3* 1.4* 1.9  PHOS   < >  --   --  1.2* 5.2* 4.3 5.1* 4.3  --    < > = values in this interval not displayed.   GFR: Estimated Creatinine Clearance: 107.8 mL/min (A) (by C-G formula based on SCr of 0.59 mg/dL (L)). Liver Function Tests: Recent Labs  Lab 06/06/20 0429 06/07/20 0659 06/08/20 0456 06/09/20 0441 06/10/20 0429  AST 308* 173* 132* 122* 122*  ALT 65* 51* 45* 48* 49*  ALKPHOS 293* 229* 212* 214* 238*  BILITOT 3.4* 3.2* 2.4* 2.4* 1.6*  PROT 6.2* 5.9* 5.5* 5.3* 5.2*  ALBUMIN 3.2* 3.5 2.9* 2.9* 2.8*   Recent Labs  Lab 06/04/20 1935  LIPASE 69*   Recent Labs  Lab 06/04/20 1950  AMMONIA 108*   Coagulation Profile: Recent Labs  Lab 06/06/20 1631  INR 2.8*   Cardiac Enzymes: Recent Labs  Lab 06/04/20 2117  CKTOTAL 321   BNP (last 3 results) No results for input(s): PROBNP in the last 8760 hours. HbA1C: No results for input(s): HGBA1C in the last 72 hours. CBG: Recent Labs  Lab 06/09/20 1309 06/09/20 1640 06/09/20 2019 06/10/20 0615 06/10/20 1125  GLUCAP 126* 191* 123* 84 170*   Lipid Profile: No results for input(s): CHOL, HDL, LDLCALC, TRIG, CHOLHDL, LDLDIRECT in the last 72 hours. Thyroid Function Tests: No results for input(s): TSH, T4TOTAL, FREET4, T3FREE, THYROIDAB  in the last 72 hours. Anemia Panel: No results for input(s): VITAMINB12, FOLATE, FERRITIN, TIBC, IRON, RETICCTPCT in the last 72 hours. Sepsis Labs: Recent Labs  Lab 06/04/20 2117 06/05/20 1127 06/05/20 1354 06/06/20 1450  LATICACIDVEN 7.0* 3.0* 2.3* 0.8    Recent Results (from the past 240 hour(s))  Respiratory Panel by RT PCR (Flu A&B, Covid) - Nasopharyngeal Swab     Status: None   Collection Time: 06/04/20  9:17 PM   Specimen: Nasopharyngeal Swab  Result Value Ref Range Status   SARS Coronavirus 2 by  RT PCR NEGATIVE NEGATIVE Final    Comment: (NOTE) SARS-CoV-2 target nucleic acids are NOT DETECTED.  The SARS-CoV-2 RNA is generally detectable in upper respiratoy specimens during the acute phase of infection. The lowest concentration of SARS-CoV-2 viral copies this assay can detect is 131 copies/mL. A negative result does not preclude SARS-Cov-2 infection and should not be used as the sole basis for treatment or other patient management decisions. A negative result may occur with  improper specimen collection/handling, submission of specimen other than nasopharyngeal swab, presence of viral mutation(s) within the areas targeted by this assay, and inadequate number of viral copies (<131 copies/mL). A negative result must be combined with clinical observations, patient history, and epidemiological information. The expected result is Negative.  Fact Sheet for Patients:  https://www.moore.com/  Fact Sheet for Healthcare Providers:  https://www.young.biz/  This test is no t yet approved or cleared by the [place] FDA and  has been authorized for detection and/or diagnosis of SARS-CoV-2 by FDA under an Emergency Use Authorization (EUA). This EUA will remain  in effect (meaning this test can be used) for the duration of the COVID-19 declaration under Section 564(b)(1) of the Act, 21 U.S.C. section 360bbb-3(b)(1), unless the  authorization is terminated or revoked sooner.     Influenza A by PCR NEGATIVE NEGATIVE Final   Influenza B by PCR NEGATIVE NEGATIVE Final    Comment: (NOTE) The Xpert Xpress SARS-CoV-2/FLU/RSV assay is intended as an aid in  the diagnosis of influenza from Nasopharyngeal swab specimens and  should not be used as a sole basis for treatment. Nasal washings and  aspirates are unacceptable for Xpert Xpress SARS-CoV-2/FLU/RSV  testing.  Fact Sheet for Patients: https://www.moore.com/  Fact Sheet for Healthcare Providers: https://www.young.biz/  This test is not yet approved or cleared by the [place] FDA and  has been authorized for detection and/or diagnosis of SARS-CoV-2 by  FDA under an Emergency Use Authorization (EUA). This EUA will remain  in effect (meaning this test can be used) for the duration of the  Covid-19 declaration under Section 564(b)(1) of the Act, 21  U.S.C. section 360bbb-3(b)(1), unless the authorization is  terminated or revoked. Performed at [facility], [address] [place], [place] [address]   MRSA PCR Screening     Status: None   Collection Time: 06/06/20 12:45 PM   Specimen: Nasopharyngeal  Result Value Ref Range Status   MRSA by PCR NEGATIVE NEGATIVE Final    Comment:        The GeneXpert MRSA Assay (FDA approved for NASAL specimens only), is one component of a comprehensive MRSA colonization surveillance program. It is not intended to diagnose MRSA infection nor to guide or monitor treatment for MRSA infections. Performed at [facility], [address] [place], [place] [address]          Radiology Studies: No results found.      Scheduled Meds:  calcium-vitamin D  1 tablet Oral TID   Chlorhexidine Gluconate Cloth  6 each Topical Daily   docusate sodium  100 mg Oral BID   feeding supplement  237 mL Oral BID BM   folic acid  1 mg Oral Daily   heparin   5,000 Units Subcutaneous Q8H   levothyroxine  100 mcg Oral Daily   midodrine  10 mg Oral TID WC   multivitamin with minerals  1 tablet Oral Daily   nicotine  21 mg Transdermal Daily   thiamine  100 mg Oral Daily  Continuous Infusions:  sodium chloride       LOS: 6 days    Time spent: 28 minutes    Marrion Coyekui Shakeyla Giebler, MD [facility]   To contact the attending provider between 7A-7P or the covering provider during after hours 7P-7A, please log into the web site [email or web address] and access using universal [place] password for that web site. If you do not have the password, please call the hospital operator.  06/10/2020, 1:04 PM

## 2020-06-11 LAB — CBC WITH DIFFERENTIAL/PLATELET
Abs Immature Granulocytes: 0.29 10*3/uL — ABNORMAL HIGH (ref 0.00–0.07)
Basophils Absolute: 0.1 10*3/uL (ref 0.0–0.1)
Basophils Relative: 1 %
Eosinophils Absolute: 0.1 10*3/uL (ref 0.0–0.5)
Eosinophils Relative: 1 %
HCT: 28.5 % — ABNORMAL LOW (ref 39.0–52.0)
Hemoglobin: 9.9 g/dL — ABNORMAL LOW (ref 13.0–17.0)
Immature Granulocytes: 5 %
Lymphocytes Relative: 31 %
Lymphs Abs: 1.9 10*3/uL (ref 0.7–4.0)
MCH: 37.6 pg — ABNORMAL HIGH (ref 26.0–34.0)
MCHC: 34.7 g/dL (ref 30.0–36.0)
MCV: 108.4 fL — ABNORMAL HIGH (ref 80.0–100.0)
Monocytes Absolute: 1 10*3/uL (ref 0.1–1.0)
Monocytes Relative: 16 %
Neutro Abs: 2.7 10*3/uL (ref 1.7–7.7)
Neutrophils Relative %: 46 %
Platelets: 81 10*3/uL — ABNORMAL LOW (ref 150–400)
RBC: 2.63 MIL/uL — ABNORMAL LOW (ref 4.22–5.81)
RDW: 18.1 % — ABNORMAL HIGH (ref 11.5–15.5)
WBC: 6 10*3/uL (ref 4.0–10.5)
nRBC: 1.3 % — ABNORMAL HIGH (ref 0.0–0.2)

## 2020-06-11 LAB — BASIC METABOLIC PANEL
Anion gap: 8 (ref 5–15)
BUN: 5 mg/dL — ABNORMAL LOW (ref 6–20)
CO2: 27 mmol/L (ref 22–32)
Calcium: 8.8 mg/dL — ABNORMAL LOW (ref 8.9–10.3)
Chloride: 102 mmol/L (ref 98–111)
Creatinine, Ser: 0.74 mg/dL (ref 0.61–1.24)
GFR, Estimated: >60 mL/min (ref >60)
Glucose, Bld: 85 mg/dL (ref 70–99)
Potassium: 3.8 mmol/L (ref 3.5–5.1)
Sodium: 137 mmol/L (ref 135–145)

## 2020-06-11 LAB — MAGNESIUM: Magnesium: 1.3 mg/dL — ABNORMAL LOW (ref 1.7–2.4)

## 2020-06-11 LAB — GLUCOSE, CAPILLARY: Glucose-Capillary: 86 mg/dL (ref 70–99)

## 2020-06-11 MED ORDER — MAGNESIUM SULFATE 4 GM/100ML IV SOLN
4.0000 g | Freq: Once | INTRAVENOUS | Status: AC
  Administered 2020-06-11: 4 g via INTRAVENOUS
  Filled 2020-06-11: qty 100

## 2020-06-11 MED ORDER — MAGNESIUM 30 MG PO TABS: 30.0000 mg | ORAL_TABLET | Freq: Two times a day (BID) | ORAL | 0 refills | Status: DC

## 2020-06-11 NOTE — Progress Notes (Signed)
Pt discharged home with wife at this time. Discharge education given to and reviewed with patient. He expressed understanding. VSS prior to discharge. Pt ambulated around unit prior to discharge.

## 2020-06-11 NOTE — TOC Transition Note (Signed)
Transition of Care [facility]) - CM/SW Discharge Note   Patient Details  Name: Joshua Medina MRN: [id number] Date of Birth: 1986-05-29  [facility] CM/SW Contact:  Ova Freshwater Phone Number: [phone] 06/11/2020, 11:29 AM   Clinical Narrative:     CSW met with patient to speak about substance abuse counseling and access to facilities in the area.  CSW explained to patient TOC role in patient care.  CSW gave patient resource list and spoke about the different options available in the area for substance abuse counseling.  Patient has pending Medicaid and will make appointment for PCP once it is approved. CSW spoke with patient about finding a PCP that takes Medicaid and indicated a couple of facilities in the area where the patient could make an appt with a PCP.  Patient verbalized understanding.  Patient from home, lives with [name], [name] [phone].  Patient stated is able to complete ADLs on his own.  Patient stated he began a new job, in a [employer] before he came to the hospital.  Patient stated he was not certain he still had a job.  Patient is a member of an indigenous tribe and stated he has supports there.  CSW encouraged patient to use support system and to consider therapy along with substance abuse program.  Patient verbalized understanding. TOC consult complete.  Final next level of care: Home/Self Care     Patient Goals and CMS Choice        Discharge Placement                       Discharge Plan and Services In-house Referral: Clinical Social Work                                   Social Determinants of Health (SDOH) Interventions     Readmission Risk Interventions No flowsheet data found.

## 2020-06-11 NOTE — Discharge Instructions (Signed)
   Alcohol Withdrawal Syndrome When a person who drinks a lot of alcohol stops drinking, he or she may have unpleasant and serious symptoms. These symptoms are called alcohol withdrawal syndrome. This condition may be mild or severe. It can be life-threatening. It can cause:  Shaking that you cannot control (tremor).  Sweating.  Headache.  Feeling fearful, upset, grouchy, or depressed.  Trouble sleeping (insomnia).  Nightmares.  Fast or uneven heartbeats (palpitations).  Alcohol cravings.  Feeling sick to your stomach (nausea).  Throwing up (vomiting).  Being bothered by light and sounds.  Confusion.  Trouble thinking clearly.  Not being hungry (loss of appetite).  Big changes in mood (mood swings). If you have all of the following symptoms at the same time, get help right away:  High blood pressure.  Fast heartbeat.  Trouble breathing.  Seizures.  Seeing, hearing, feeling, smelling, or tasting things that are not there (hallucinations). These symptoms are known as delirium tremens (DTs). They must be treated at the hospital right away. Follow these instructions at home:   Take over-the-counter and prescription medicines only as told by your doctor. This includes vitamins.  Do not drink alcohol.  Do not drive until your doctor says that this is safe for you.  Have someone stay with you or be available in case you need help. This should be someone you trust. This person can help you with your symptoms. He or she can also help you to not drink.  Drink enough fluid to keep your pee (urine) pale yellow.  Think about joining a support group or a treatment program to help you stop drinking.  Keep all follow-up visits as told by your doctor. This is important. Contact a doctor if:  Your symptoms get worse.  You cannot eat or drink without throwing up.  You have a hard time not drinking alcohol.  You cannot stop drinking alcohol. Get help right away  if:  You have fast or uneven heartbeats.  You have chest pain.  You have trouble breathing.  You have a seizure for the first time.  You see, hear, feel, smell, or taste something that is not there.  You get very confused. Summary  When a person who drinks a lot of alcohol stops drinking, he or she may have serious symptoms. This is called alcohol withdrawal syndrome.  Delirium tremens (DTs) is a group of life-threatening symptoms. You should get help right away if you have these symptoms.  Think about joining an alcohol support group or a treatment program. This information is not intended to replace advice given to you by your health care provider. Make sure you discuss any questions you have with your health care provider. Document Revised: 07/20/2017 Document Reviewed: 04/13/2017 Elsevier Patient Education  [employer]. #1.  Follow-up with the physician in 1 week time, social worker will help set up appointment. 2.  Quit alcohol drinking.

## 2020-06-11 NOTE — Discharge Summary (Signed)
Physician Discharge Summary  Patient ID: Joshua Medina MRN: [id number] DOB/AGE: 1986/07/07 34 y.o.  Admit date: 06/04/2020 Discharge date: 06/11/2020  Admission Diagnoses:  Discharge Diagnoses:  Active Problems:   Delirium tremens (HCC)   Alcohol withdrawal seizure (HCC)   Hypotension   Hypoglycemia without diagnosis of diabetes mellitus   Discharged Condition: good  Hospital Course:  70M hx of Graves dz, smoker, alcoholism came in due to tonic-clonic seizure with post-ictal state. Initially admitted to [facility] on medical floor and treated for electrolyte derrangements, Delerium tremors. Patient failed CIWA protocol on floor with progressive symptoms of combative behavior with hyperactive delerium and upgraded to MICU with PCCM consult for further management of severe DTs.    06/06/20- patient very sensitive to precedexit was initiated on admission but he immediately became hypotensive and bradycardic have dcd this therapy for now and utilizing alternate agent. 10/18 remains on pressors, severe DT's 10/19 severe DT's 10/20 Continues to hallucinate w/intermittent agitation, oriented to self and year, tolerating CIWA protocol. No precedex  10/22. Patient condition improved. Currently patient has no complaints, eating well. No agitation or confusion. He is medically stable to be discharged. I have asked social worker to set up outpatient follow-up with PCP, also provide resources for alcohol addiction. Spoke with patient wife, he absolutely needed to quit alcohol drinking.    #1.  Alcohol withdrawal seizure with acute metabolic encephalopathy. Condition had improved.  2.  Delirium treatments. Condition also improved.  3.  Hypotension. Patient was on Levophed, condition had improved. Blood pressure has been stable.  4.  Hypoglycemia Resolved after appetite improved.  5.  Hypomagnesemia. Give additional 4 g of magnesium sulfate again today. We will give her 1 month supply  of magnesium oral supplements.  6.  Macrocytic anemia. Secondary to alcohol, B12 level normal.  7.  Alcoholic hepatitis. Advised to quit.  8.  Thrombocytopenia. Secondary to alcohol liver disease.   Improving.   Consults: pulmonary/intensive care  Significant Diagnostic Studies:   Treatments: CIWA  Discharge Exam: Blood pressure 114/88, pulse 86, temperature 98.2 F (36.8 C), temperature source Oral, resp. rate (!) 9, height 5\' 10"  (1.778 m), weight 59.7 kg, SpO2 100 %. General appearance: alert and cooperative Resp: clear to auscultation bilaterally Cardio: regular rate and rhythm, S1, S2 normal, no murmur, click, rub or gallop GI: soft, non-tender; bowel sounds normal; no masses,  no organomegaly Extremities: extremities normal, atraumatic, no cyanosis or edema  Disposition: Discharge disposition: 01-Home or Self Care       Discharge Instructions    Diet - low sodium heart healthy   Complete by: As directed    Increase activity slowly   Complete by: As directed      Allergies as of 06/11/2020      Reactions   Precedex [dexmedetomidine Hcl In Nacl] Other (See Comments)      Medication List    TAKE these medications   feeding supplement Liqd Take 237 mLs by mouth 2 (two) times daily between meals.   folic acid 1 MG tablet Commonly known as: FOLVITE Take 1 tablet (1 mg total) by mouth daily.   levothyroxine 100 MCG tablet Commonly known as: Synthroid Take 1 tablet (100 mcg total) by mouth daily.   magnesium 30 MG tablet Take 1 tablet (30 mg total) by mouth 2 (two) times daily.   multivitamin with minerals Tabs tablet Take 1 tablet by mouth daily.   nicotine 21 mg/24hr patch Commonly known as: NICODERM CQ - dosed in mg/24 hours Place  1 patch (21 mg total) onto the skin daily.   OLANZapine zydis 5 MG disintegrating tablet Commonly known as: ZYPREXA Take 0.5 tablets (2.5 mg total) by mouth at bedtime.   thiamine 100 MG tablet Take 1 tablet  (100 mg total) by mouth daily.      32 minutes  Signed: Marrion Coy 06/11/2020, 8:11 AM

## 2020-06-13 ENCOUNTER — Emergency Department
Admission: EM | Admit: 2020-06-13 | Discharge: 2020-06-13 | Disposition: A | Discharge status: Home / Self Care | Payer: Medicaid Other | Attending: Emergency Medicine | Admitting: Emergency Medicine

## 2020-06-13 ENCOUNTER — Emergency Department: Payer: Medicaid Other

## 2020-06-13 ENCOUNTER — Other Ambulatory Visit: Payer: Self-pay

## 2020-06-13 DIAGNOSIS — F10231 Alcohol dependence with withdrawal delirium: Secondary | ICD-10-CM | POA: Insufficient documentation

## 2020-06-13 DIAGNOSIS — Z79899 Other long term (current) drug therapy: Secondary | ICD-10-CM | POA: Insufficient documentation

## 2020-06-13 DIAGNOSIS — Y92012 Bathroom of single-family (private) house as the place of occurrence of the external cause: Secondary | ICD-10-CM | POA: Insufficient documentation

## 2020-06-13 DIAGNOSIS — R569 Unspecified convulsions: Secondary | ICD-10-CM

## 2020-06-13 DIAGNOSIS — W01198A Fall on same level from slipping, tripping and stumbling with subsequent striking against other object, initial encounter: Secondary | ICD-10-CM | POA: Insufficient documentation

## 2020-06-13 DIAGNOSIS — Y900 Blood alcohol level of less than 20 mg/100 ml: Secondary | ICD-10-CM | POA: Insufficient documentation

## 2020-06-13 DIAGNOSIS — E039 Hypothyroidism, unspecified: Secondary | ICD-10-CM | POA: Insufficient documentation

## 2020-06-13 DIAGNOSIS — F10239 Alcohol dependence with withdrawal, unspecified: Secondary | ICD-10-CM

## 2020-06-13 DIAGNOSIS — S0121XA Laceration without foreign body of nose, initial encounter: Secondary | ICD-10-CM | POA: Insufficient documentation

## 2020-06-13 DIAGNOSIS — F17200 Nicotine dependence, unspecified, uncomplicated: Secondary | ICD-10-CM | POA: Insufficient documentation

## 2020-06-13 DIAGNOSIS — Z20822 Contact with and (suspected) exposure to covid-19: Secondary | ICD-10-CM | POA: Insufficient documentation

## 2020-06-13 LAB — COMPREHENSIVE METABOLIC PANEL
ALT: 42 U/L (ref 0–44)
AST: 87 U/L — ABNORMAL HIGH (ref 15–41)
Albumin: 3.5 g/dL (ref 3.5–5.0)
Alkaline Phosphatase: 222 U/L — ABNORMAL HIGH (ref 38–126)
Anion gap: 11 (ref 5–15)
BUN: 5 mg/dL — ABNORMAL LOW (ref 6–20)
CO2: 24 mmol/L (ref 22–32)
Calcium: 8.5 mg/dL — ABNORMAL LOW (ref 8.9–10.3)
Chloride: 102 mmol/L (ref 98–111)
Creatinine, Ser: 0.7 mg/dL (ref 0.61–1.24)
GFR, Estimated: >60 mL/min (ref >60)
Glucose, Bld: 85 mg/dL (ref 70–99)
Potassium: 3.7 mmol/L (ref 3.5–5.1)
Sodium: 137 mmol/L (ref 135–145)
Total Bilirubin: 1.7 mg/dL — ABNORMAL HIGH (ref 0.3–1.2)
Total Protein: 6.5 g/dL (ref 6.5–8.1)

## 2020-06-13 LAB — CBC WITH DIFFERENTIAL/PLATELET
Abs Immature Granulocytes: 0.34 10*3/uL — ABNORMAL HIGH (ref 0.00–0.07)
Basophils Absolute: 0.1 10*3/uL (ref 0.0–0.1)
Basophils Relative: 1 %
Eosinophils Absolute: 0.1 10*3/uL (ref 0.0–0.5)
Eosinophils Relative: 1 %
HCT: 28.9 % — ABNORMAL LOW (ref 39.0–52.0)
Hemoglobin: 9.7 g/dL — ABNORMAL LOW (ref 13.0–17.0)
Immature Granulocytes: 5 %
Lymphocytes Relative: 28 %
Lymphs Abs: 1.8 10*3/uL (ref 0.7–4.0)
MCH: 37 pg — ABNORMAL HIGH (ref 26.0–34.0)
MCHC: 33.6 g/dL (ref 30.0–36.0)
MCV: 110.3 fL — ABNORMAL HIGH (ref 80.0–100.0)
Monocytes Absolute: 1.3 10*3/uL — ABNORMAL HIGH (ref 0.1–1.0)
Monocytes Relative: 21 %
Neutro Abs: 2.8 10*3/uL (ref 1.7–7.7)
Neutrophils Relative %: 44 %
Platelets: 126 10*3/uL — ABNORMAL LOW (ref 150–400)
RBC: 2.62 MIL/uL — ABNORMAL LOW (ref 4.22–5.81)
RDW: 17.1 % — ABNORMAL HIGH (ref 11.5–15.5)
WBC: 6.4 10*3/uL (ref 4.0–10.5)
nRBC: 0.6 % — ABNORMAL HIGH (ref 0.0–0.2)

## 2020-06-13 LAB — LACTIC ACID, PLASMA
Lactic Acid, Venous: 2.2 mmol/L (ref 0.5–1.9)
Lactic Acid, Venous: 1.4 mmol/L (ref 0.5–1.9)

## 2020-06-13 LAB — RESPIRATORY PANEL BY RT PCR (FLU A&B, COVID)
Influenza A by PCR: NEGATIVE
Influenza B by PCR: NEGATIVE
SARS Coronavirus 2 by RT PCR: NEGATIVE

## 2020-06-13 LAB — ETHANOL: Alcohol, Ethyl (B): <10 mg/dL (ref <10)

## 2020-06-13 LAB — AMMONIA: Ammonia: 96 umol/L — ABNORMAL HIGH (ref 9–35)

## 2020-06-13 MED ORDER — THIAMINE HCL 100 MG/ML IJ SOLN
100.0000 mg | Freq: Once | INTRAMUSCULAR | Status: AC
  Administered 2020-06-13: 100 mg via INTRAVENOUS
  Filled 2020-06-13: qty 2

## 2020-06-13 MED ORDER — LORAZEPAM 2 MG/ML IJ SOLN
2.0000 mg | Freq: Once | INTRAMUSCULAR | Status: AC
  Administered 2020-06-13: 2 mg via INTRAVENOUS
  Filled 2020-06-13: qty 1

## 2020-06-13 MED ORDER — LACTATED RINGERS IV BOLUS
1000.0000 mL | Freq: Once | INTRAVENOUS | Status: AC
  Administered 2020-06-13: 1000 mL via INTRAVENOUS

## 2020-06-13 NOTE — ED Notes (Signed)
Spoke with pt and informed him that per Dr. Cyril Loosen if he can find someone to come pick him up we will discharge him. Pt was informed we have tried contacting his wife and we have not gotten an answer and she has not called [place] back. RN asked pt if there was someone else to come pick him up, pt states that he does not know a number to call and he does not have his cell phone.

## 2020-06-13 NOTE — ED Notes (Signed)
Critical lab value of lactic acid 2.2. Dr. Don Perking notified. No new orders at this time.

## 2020-06-13 NOTE — ED Notes (Signed)
Per Dr. Cyril Loosen, pt can be DC if he can have someone come pick him up. RN attempted to call wife at number listed in the chart. Left Voice mail.

## 2020-06-13 NOTE — ED Provider Notes (Signed)
[facility] Emergency Department Provider Note  ____________________________________________  Time seen: Approximately 1:49 AM  I have reviewed the triage vital signs and the nursing notes.   HISTORY  Chief Complaint Seizures and Delirium Tremens (DTS)   HPI Joshua Medina is a 34 y.o. male with a history of Graves' disease, alcohol abuse, alcohol withdrawal seizures, DTs who presents from home for seizure.  Patient was found on the floor of his bathroom under the sink seizing by his wife.  When EMS arrived seizure had stopped and patient was at baseline.  Patient has an abrasion and small laceration to the bridge of the nose.  His complaint of mild nose pain but no other pain.  Is not on blood thinners.  Patient does report drinking less than his normal over the last 24 hours.  Patient is really a poor historian and unable to quantify how much he drinks but does report drinking about half of his daily consumption over the last 24 hours.  At this time he denies any headache, anxiety, tremors, vomiting or diarrhea.   Past Medical History:  Diagnosis Date  . Graves disease   . Thyroid disease     Patient Active Problem List   Diagnosis Date Noted  . Hypotension 06/10/2020  . Hypoglycemia without diagnosis of diabetes mellitus 06/10/2020  . Alcohol withdrawal seizure (HCC) 06/04/2020  . Malnutrition of moderate degree 02/17/2020  . Hypomagnesemia 02/16/2020  . Delirium tremens (HCC) 02/16/2020  . Alcohol use disorder, moderate, dependence (HCC) 02/15/2020  . Hypothyroidism (acquired) 02/15/2020  . Alcoholic liver disease (HCC) 02/15/2020  . Alcohol withdrawal seizure with complication, with delirium (HCC) 02/15/2020  . Alcohol withdrawal seizure, with delirium (HCC) 02/15/2020  . Thrombocytopenia (HCC) 02/15/2020  . Anemia 02/15/2020  . Hypokalemia 02/15/2020    Past Surgical History:  Procedure Laterality Date  . thyroid ablation with radioactive  I-131      Prior to Admission medications   Medication Sig Start Date End Date Taking? Authorizing Provider  feeding supplement, ENSURE ENLIVE, (ENSURE ENLIVE) LIQD Take 237 mLs by mouth 2 (two) times daily between meals. Patient not taking: Reported on 06/13/2020 02/20/20   Elease EtienneHongalgi, Anand D, MD  folic acid (FOLVITE) 1 MG tablet Take 1 tablet (1 mg total) by mouth daily. Patient not taking: Reported on 06/05/2020 02/21/20   Elease EtienneHongalgi, Anand D, MD  levothyroxine (SYNTHROID) 100 MCG tablet Take 1 tablet (100 mcg total) by mouth daily. Patient not taking: Reported on 06/05/2020 02/20/20 04/03/20  Elease EtienneHongalgi, Anand D, MD  magnesium 30 MG tablet Take 1 tablet (30 mg total) by mouth 2 (two) times daily. 06/11/20   Marrion CoyZhang, Dekui, MD  Multiple Vitamin (MULTIVITAMIN WITH MINERALS) TABS tablet Take 1 tablet by mouth daily. Patient not taking: Reported on 06/05/2020 02/21/20   Elease EtienneHongalgi, Anand D, MD  nicotine (NICODERM CQ - DOSED IN MG/24 HOURS) 21 mg/24hr patch Place 1 patch (21 mg total) onto the skin daily. Patient not taking: Reported on 06/05/2020 02/21/20   Elease EtienneHongalgi, Anand D, MD  OLANZapine zydis (ZYPREXA) 5 MG disintegrating tablet Take 0.5 tablets (2.5 mg total) by mouth at bedtime. Patient not taking: Reported on 06/05/2020 02/20/20   Elease EtienneHongalgi, Anand D, MD  thiamine 100 MG tablet Take 1 tablet (100 mg total) by mouth daily. Patient not taking: Reported on 06/05/2020 02/21/20   Elease EtienneHongalgi, Anand D, MD    Allergies Precedex [dexmedetomidine hcl in nacl]  Family History  Problem Relation Age of Onset  . Diabetes Paternal Grandmother  Social History Social History   Tobacco Use  . Smoking status: Current Every Day Smoker  . Smokeless tobacco: Never Used  Substance Use Topics  . Alcohol use: Yes  . Drug use: Not on file    Review of Systems  Constitutional: Negative for fever. Eyes: Negative for visual changes. ENT: Negative for sore throat. + facial trauma Neck: No neck pain  Cardiovascular:  Negative for chest pain. Respiratory: Negative for shortness of breath. Gastrointestinal: Negative for abdominal pain, vomiting or diarrhea. Genitourinary: Negative for dysuria. Musculoskeletal: Negative for back pain. Skin: Negative for rash. Neurological: Negative for headaches, weakness or numbness. + seizure Psych: No SI or HI  ____________________________________________   PHYSICAL EXAM:  VITAL SIGNS: ED Triage Vitals  Enc Vitals Group     BP 06/13/20 0114 114/78     Pulse Rate 06/13/20 0114 95     Resp 06/13/20 0114 14     Temp 06/13/20 0130 99.2 F (37.3 C)     Temp Source 06/13/20 0130 Oral     SpO2 06/13/20 0114 99 %     Weight 06/13/20 0115 132 lb 4.4 oz (60 kg)     Height 06/13/20 0115 5\' 10"  (1.778 m)     Head Circumference --      Peak Flow --      Pain Score 06/13/20 0115 2     Pain Loc --      Pain Edu? --      Excl. in GC? --     Constitutional: Alert and oriented, chronically ill appearing.  HEENT:      Head: Normocephalic and atraumatic.         Eyes: Conjunctivae are normal. Sclera is non-icteric.       Nose: Abrasion and small skin tear of the bridge of the nose.      Mouth/Throat: Mucous membranes are moist.       Neck: Supple with no signs of meningismus.  No C-spine tenderness Cardiovascular: Regular rate and rhythm.  Respiratory: Normal respiratory effort.  Gastrointestinal: Soft, non tender. Musculoskeletal: Nontender with normal range of motion in all extremities. No edema, cyanosis, or erythema of extremities.  No T and L-spine tenderness Neurologic: Normal speech and language. Face is symmetric. Moving all extremities. No gross focal neurologic deficits are appreciated. Skin: Skin is warm, dry and intact. No rash noted. Psychiatric: Mood and affect are normal. Speech and behavior are normal.  ____________________________________________   LABS (all labs ordered are listed, but only abnormal results are displayed)  Labs Reviewed  CBC  WITH DIFFERENTIAL/PLATELET - Abnormal; Notable for the following components:      Result Value   RBC 2.62 (*)    Hemoglobin 9.7 (*)    HCT 28.9 (*)    MCV 110.3 (*)    MCH 37.0 (*)    RDW 17.1 (*)    Platelets 126 (*)    nRBC 0.6 (*)    Monocytes Absolute 1.3 (*)    Abs Immature Granulocytes 0.34 (*)    All other components within normal limits  COMPREHENSIVE METABOLIC PANEL - Abnormal; Notable for the following components:   BUN 5 (*)    Calcium 8.5 (*)    AST 87 (*)    Alkaline Phosphatase 222 (*)    Total Bilirubin 1.7 (*)    All other components within normal limits  LACTIC ACID, PLASMA - Abnormal; Notable for the following components:   Lactic Acid, Venous 2.2 (*)    All other components  within normal limits  AMMONIA - Abnormal; Notable for the following components:   Ammonia 96 (*)    All other components within normal limits  RESPIRATORY PANEL BY RT PCR (FLU A&B, COVID)  ETHANOL  LACTIC ACID, PLASMA  URINE DRUG SCREEN, QUALITATIVE ([facility] ONLY)   ____________________________________________  EKG  ED ECG REPORT I, Nita Sickle, the attending physician, personally viewed and interpreted this ECG.  Normal sinus rhythm, rate of 88, normal intervals, right axis deviation, no ST elevations or depressions.  No significant changes when compared to prior from 9 days ago. ____________________________________________  RADIOLOGY  I have personally reviewed the images performed during this visit and I agree with the Radiologist's read.   Interpretation by Radiologist:  CT Head Wo Contrast  Result Date: 06/13/2020 CLINICAL DATA:  Seizure with facial trauma. EXAM: CT HEAD WITHOUT CONTRAST TECHNIQUE: Contiguous axial images were obtained from the base of the skull through the vertex without intravenous contrast. COMPARISON:  06/04/2020 FINDINGS: Brain: Prominent cerebral atrophy for age. No ventricular dilatation. No mass effect or midline shift. No abnormal extra-axial  fluid collections. Gray-white matter junctions are distinct. Basal cisterns are not effaced. No acute intracranial hemorrhage. Vascular: No hyperdense vessel or unexpected calcification. Skull: The calvarium appears intact. No acute depressed skull fractures. Sinuses/Orbits: Paranasal sinuses and mastoid air cells are clear. Other: No significant change since previous study. IMPRESSION: 1. No acute intracranial abnormalities. 2. Prominent cerebral atrophy for age. Electronically Signed   By: Burman Nieves M.D.   On: 06/13/2020 02:34      ____________________________________________   PROCEDURES  Procedure(s) performed:yes .1-3 Lead EKG Interpretation Performed by: Nita Sickle, MD Authorized by: Nita Sickle, MD     Interpretation: normal     ECG rate assessment: normal     Rhythm: sinus rhythm     Ectopy: none     Critical Care performed:  None ____________________________________________   INITIAL IMPRESSION / ASSESSMENT AND PLAN / ED COURSE  34 y.o. male with a history of Graves' disease, alcohol abuse, alcohol withdrawal seizures, DTs who presents from home for seizure in the setting of decreased alcohol intake over the last 24 hours. CIWA of 4 on arrival. Patient is currently back at baseline.  Has a small skin tear of the bridge of the nose from trauma.  No other traumatic injuries on physical and history.  Old medical records reviewed since patient has been hospitalized and discharged 2 days ago.  Patient was admitted for DTs and alcohol withdrawal seizures and ended up in the ICU on Precedex and vasopressors for severe withdrawals.  EKG with no acute changes when compared to prior.  Labs are pending to rule out any significant electrolyte derangements.  Patient given 2 mg of IV Ativan.  Patient placed on telemetry for close monitoring which was reviewed by me.  Head CT visualized by me with no intracranial traumatic injuries, confirmed by radiology.  Patient placed on  seizure precautions. Will give IVF and IV thiamine. Will monitor closely.  Low threshold for admission considering how significantly sick patient was on his most recent admission.  _________________________ 5:39 AM on 06/13/2020 -----------------------------------------  On reassessment CIWA of 0. Discussed admission due elevated ammonia although it seems patient has had higher ammonia in the past. Has no cirrhosis yet and is not prescribed lactulose yet. He is mentanting well with no signs of hepatic encephalopathy.  His EtOH level is zero.  Initial lactic was borderline at 2.2 in the setting of seizure but that has improved  and repeat was 1.4.  Covid negative.  Remaining of his labs are stable and currently at baseline. Patient refused admission and wishes to go home.  He does not wish any detox at this time.  Will contact patient's wife and if she is in agreement patient be discharged home to her care.  Recommended returning to the emergency room at any time if he changes his mind or has any further seizures at home.     _____________________________________________ Please note:  Patient was evaluated in Emergency Department today for the symptoms described in the history of present illness. Patient was evaluated in the context of the global COVID-19 pandemic, which necessitated consideration that the patient might be at risk for infection with the SARS-CoV-2 virus that causes COVID-19. Institutional protocols and algorithms that pertain to the evaluation of patients at risk for COVID-19 are in a state of rapid change based on information released by regulatory bodies including the CDC and federal and state organizations. These policies and algorithms were followed during the patient's care in the ED.  Some ED evaluations and interventions may be delayed as a result of limited staffing during the pandemic.   [place] Controlled Substance Database was reviewed by  me. ____________________________________________   FINAL CLINICAL IMPRESSION(S) / ED DIAGNOSES   Final diagnoses:  Alcohol withdrawal seizure with complication (HCC)      NEW MEDICATIONS STARTED DURING THIS VISIT:  ED Discharge Orders    None       Note:  This document was prepared using Dragon voice recognition software and may include unintentional dictation errors.    Don Perking, [place], MD 06/13/20 [phone]

## 2020-06-13 NOTE — ED Notes (Addendum)
Spoke with wife Victorino Dike informed her that patient was ready for discharge unable to transport patient back home will work on ride for patient as she does not have transportation, advised her that the cab company is not answering either.

## 2020-06-13 NOTE — ED Notes (Signed)
Attempted to contact pts wife without success.

## 2020-06-13 NOTE — ED Notes (Signed)
[employer] called, cab arranged will be here in about 45 minutes.

## 2020-06-13 NOTE — ED Triage Notes (Signed)
Pt with seizure at home per ems, per ems pt fell strking head on sink. Pt with last etoh sometime yesterday, pt states drinks daily several beers and shots daily. Pt with bedbugs as well. Laceration noted to nasal bridge. No incontinence noted.

## 2020-09-20 ENCOUNTER — Emergency Department
Admission: EM | Admit: 2020-09-20 | Discharge: 2020-09-20 | Discharge status: Against Medical Advice | Payer: Medicaid Other | Attending: Emergency Medicine | Admitting: Emergency Medicine

## 2020-09-20 ENCOUNTER — Other Ambulatory Visit: Payer: Self-pay

## 2020-09-20 DIAGNOSIS — E876 Hypokalemia: Secondary | ICD-10-CM

## 2020-09-20 DIAGNOSIS — E079 Disorder of thyroid, unspecified: Secondary | ICD-10-CM | POA: Insufficient documentation

## 2020-09-20 DIAGNOSIS — F17210 Nicotine dependence, cigarettes, uncomplicated: Secondary | ICD-10-CM | POA: Insufficient documentation

## 2020-09-20 DIAGNOSIS — D696 Thrombocytopenia, unspecified: Secondary | ICD-10-CM

## 2020-09-20 DIAGNOSIS — E872 Acidosis: Secondary | ICD-10-CM | POA: Insufficient documentation

## 2020-09-20 DIAGNOSIS — F10930 Alcohol use, unspecified with withdrawal, uncomplicated: Secondary | ICD-10-CM

## 2020-09-20 DIAGNOSIS — Z79899 Other long term (current) drug therapy: Secondary | ICD-10-CM | POA: Insufficient documentation

## 2020-09-20 DIAGNOSIS — Z20822 Contact with and (suspected) exposure to covid-19: Secondary | ICD-10-CM | POA: Insufficient documentation

## 2020-09-20 DIAGNOSIS — R569 Unspecified convulsions: Secondary | ICD-10-CM

## 2020-09-20 DIAGNOSIS — E8729 Other acidosis: Secondary | ICD-10-CM

## 2020-09-20 DIAGNOSIS — F10288 Alcohol dependence with other alcohol-induced disorder: Secondary | ICD-10-CM | POA: Insufficient documentation

## 2020-09-20 LAB — CBC WITH DIFFERENTIAL/PLATELET
Abs Immature Granulocytes: 0.01 10*3/uL (ref 0.00–0.07)
Basophils Absolute: 0.1 10*3/uL (ref 0.0–0.1)
Basophils Relative: 2 %
Eosinophils Absolute: 0 10*3/uL (ref 0.0–0.5)
Eosinophils Relative: 0 %
HCT: 41.2 % (ref 39.0–52.0)
Hemoglobin: 14.6 g/dL (ref 13.0–17.0)
Immature Granulocytes: 0 %
Lymphocytes Relative: 27 %
Lymphs Abs: 1.3 10*3/uL (ref 0.7–4.0)
MCH: 33.6 pg (ref 26.0–34.0)
MCHC: 35.4 g/dL (ref 30.0–36.0)
MCV: 94.7 fL (ref 80.0–100.0)
Monocytes Absolute: 0.4 10*3/uL (ref 0.1–1.0)
Monocytes Relative: 9 %
Neutro Abs: 3.1 10*3/uL (ref 1.7–7.7)
Neutrophils Relative %: 62 %
Platelets: 66 10*3/uL — ABNORMAL LOW (ref 150–400)
RBC: 4.35 MIL/uL (ref 4.22–5.81)
RDW: 15.5 % (ref 11.5–15.5)
WBC: 5 10*3/uL (ref 4.0–10.5)
nRBC: 0 % (ref 0.0–0.2)

## 2020-09-20 LAB — COMPREHENSIVE METABOLIC PANEL
ALT: 53 U/L — ABNORMAL HIGH (ref 0–44)
AST: 146 U/L — ABNORMAL HIGH (ref 15–41)
Albumin: 4.3 g/dL (ref 3.5–5.0)
Alkaline Phosphatase: 125 U/L (ref 38–126)
Anion gap: 21 — ABNORMAL HIGH (ref 5–15)
BUN: 13 mg/dL (ref 6–20)
CO2: 21 mmol/L — ABNORMAL LOW (ref 22–32)
Calcium: 8.6 mg/dL — ABNORMAL LOW (ref 8.9–10.3)
Chloride: 95 mmol/L — ABNORMAL LOW (ref 98–111)
Creatinine, Ser: 1.12 mg/dL (ref 0.61–1.24)
GFR, Estimated: >60 mL/min (ref >60)
Glucose, Bld: 131 mg/dL — ABNORMAL HIGH (ref 70–99)
Potassium: 3.4 mmol/L — ABNORMAL LOW (ref 3.5–5.1)
Sodium: 137 mmol/L (ref 135–145)
Total Bilirubin: 1.2 mg/dL (ref 0.3–1.2)
Total Protein: 7.3 g/dL (ref 6.5–8.1)

## 2020-09-20 LAB — MAGNESIUM: Magnesium: 0.6 mg/dL — CL (ref 1.7–2.4)

## 2020-09-20 LAB — ETHANOL: Alcohol, Ethyl (B): <10 mg/dL (ref <10)

## 2020-09-20 MED ORDER — MAGNESIUM OXIDE 400 (241.3 MG) MG PO TABS
800.0000 mg | ORAL_TABLET | Freq: Once | ORAL | Status: AC
  Administered 2020-09-20: 800 mg via ORAL
  Filled 2020-09-20: qty 2

## 2020-09-20 MED ORDER — POTASSIUM CHLORIDE CRYS ER 20 MEQ PO TBCR
80.0000 meq | EXTENDED_RELEASE_TABLET | Freq: Once | ORAL | Status: AC
  Administered 2020-09-20: 80 meq via ORAL
  Filled 2020-09-20: qty 4

## 2020-09-20 NOTE — ED Notes (Signed)
Pt unable to sign AMA/Discharge as no computer with pad available

## 2020-09-20 NOTE — ED Provider Notes (Addendum)
[facility] Emergency Department Provider Note  ____________________________________________   Event Date/Time   First MD Initiated Contact with Patient 09/20/20 1633     (approximate)  I have reviewed the triage vital signs and the nursing notes.   HISTORY  Chief Complaint Withdrawal   HPI Joshua Medina is a 35 y.o. male patient presents with a past medical history of thyroid disease and alcohol abuse with previous episodes of DTs via EMS from home with concern for 3 witnessed seizures today by family.  Patient does not recall these episodes and he states he feels little tired.  He denies any recent sick symptoms or current pain.  No recent headache, earache, sore throat, fevers, chills, cough, nausea, vomiting, diarrhea, dysuria, rash or focal extremity pain weakness or numbness or tingling.  He denies any recent injuries or falls.  He does endorse tobacco abuse denies other illicit drug use.  He states he had a cup of alcohol this morning but is unable to state how much he typically drinks per day.  No SI or HI.  He does note he is not interested in quitting drinking or seeking help at this time.         Past Medical History:  Diagnosis Date  . Graves disease   . Thyroid disease     Patient Active Problem List   Diagnosis Date Noted  . Hypotension 06/10/2020  . Hypoglycemia without diagnosis of diabetes mellitus 06/10/2020  . Alcohol withdrawal seizure (HCC) 06/04/2020  . Malnutrition of moderate degree 02/17/2020  . Hypomagnesemia 02/16/2020  . Delirium tremens (HCC) 02/16/2020  . Alcohol use disorder, moderate, dependence (HCC) 02/15/2020  . Hypothyroidism (acquired) 02/15/2020  . Alcoholic liver disease (HCC) 02/15/2020  . Alcohol withdrawal seizure with complication, with delirium (HCC) 02/15/2020  . Alcohol withdrawal seizure, with delirium (HCC) 02/15/2020  . Thrombocytopenia (HCC) 02/15/2020  . Anemia 02/15/2020  . Hypokalemia 02/15/2020     Past Surgical History:  Procedure Laterality Date  . thyroid ablation with radioactive I-131      Prior to Admission medications   Medication Sig Start Date End Date Taking? Authorizing Provider  feeding supplement, ENSURE ENLIVE, (ENSURE ENLIVE) LIQD Take 237 mLs by mouth 2 (two) times daily between meals. Patient not taking: Reported on 06/13/2020 02/20/20   Elease Etienne, MD  folic acid (FOLVITE) 1 MG tablet Take 1 tablet (1 mg total) by mouth daily. Patient not taking: Reported on 06/05/2020 02/21/20   Elease Etienne, MD  levothyroxine (SYNTHROID) 100 MCG tablet Take 1 tablet (100 mcg total) by mouth daily. Patient not taking: Reported on 06/05/2020 02/20/20 04/03/20  Elease Etienne, MD  magnesium 30 MG tablet Take 1 tablet (30 mg total) by mouth 2 (two) times daily. 06/11/20   Marrion Coy, MD  Multiple Vitamin (MULTIVITAMIN WITH MINERALS) TABS tablet Take 1 tablet by mouth daily. Patient not taking: Reported on 06/05/2020 02/21/20   Elease Etienne, MD  nicotine (NICODERM CQ - DOSED IN MG/24 HOURS) 21 mg/24hr patch Place 1 patch (21 mg total) onto the skin daily. Patient not taking: Reported on 06/05/2020 02/21/20   Elease Etienne, MD  OLANZapine zydis (ZYPREXA) 5 MG disintegrating tablet Take 0.5 tablets (2.5 mg total) by mouth at bedtime. Patient not taking: Reported on 06/05/2020 02/20/20   Elease Etienne, MD  thiamine 100 MG tablet Take 1 tablet (100 mg total) by mouth daily. Patient not taking: Reported on 06/05/2020 02/21/20   Elease Etienne, MD  Allergies Precedex [dexmedetomidine hcl in nacl]  Family History  Problem Relation Age of Onset  . Diabetes Paternal Grandmother     Social History Social History   Tobacco Use  . Smoking status: Current Every Day Smoker    Types: Cigarettes  . Smokeless tobacco: Never Used  Vaping Use  . Vaping Use: Never used  Substance Use Topics  . Alcohol use: Yes  . Drug use: Never    Review of Systems  Review  of Systems  Constitutional: Negative for chills and fever.  HENT: Negative for sore throat.   Eyes: Negative for pain.  Respiratory: Negative for cough and stridor.   Cardiovascular: Negative for chest pain.  Gastrointestinal: Negative for vomiting.  Genitourinary: Negative for dysuria.  Musculoskeletal: Negative for myalgias.  Skin: Negative for rash.  Neurological: Positive for seizures and loss of consciousness. Negative for headaches.  Psychiatric/Behavioral: Negative for suicidal ideas.  All other systems reviewed and are negative.     ____________________________________________   PHYSICAL EXAM:  VITAL SIGNS: ED Triage Vitals  Enc Vitals Group     BP      Pulse      Resp      Temp      Temp src      SpO2      Weight      Height      Head Circumference      Peak Flow      Pain Score      Pain Loc      Pain Edu?      Excl. in GC?    Vitals:   09/20/20 1639  BP: 117/84  Pulse: 73  Resp: 16  Temp: 98.6 F (37 C)  SpO2: 96%   Physical Exam Vitals and nursing note reviewed.  Constitutional:      Appearance: He is well-developed and well-nourished.  HENT:     Head: Normocephalic and atraumatic.     Right Ear: External ear normal.     Left Ear: External ear normal.     Nose: Nose normal.     Mouth/Throat:     Mouth: Mucous membranes are moist.  Eyes:     Conjunctiva/sclera: Conjunctivae normal.  Cardiovascular:     Rate and Rhythm: Normal rate and regular rhythm.     Pulses: Normal pulses.     Heart sounds: No murmur heard.   Pulmonary:     Effort: Pulmonary effort is normal. No respiratory distress.     Breath sounds: Normal breath sounds.  Abdominal:     Palpations: Abdomen is soft.     Tenderness: There is no abdominal tenderness.  Musculoskeletal:        General: No edema.     Cervical back: Neck supple.  Skin:    General: Skin is warm and dry.     Capillary Refill: Capillary refill takes less than 2 seconds.  Neurological:     Mental  Status: He is alert and oriented to person, place, and time.     Motor: Tremor present.  Psychiatric:        Mood and Affect: Mood and affect and mood normal.      ____________________________________________   LABS (all labs ordered are listed, but only abnormal results are displayed)  Labs Reviewed  CBC WITH DIFFERENTIAL/PLATELET - Abnormal; Notable for the following components:      Result Value   Platelets 66 (*)    All other components within normal limits  COMPREHENSIVE METABOLIC  PANEL - Abnormal; Notable for the following components:   Potassium 3.4 (*)    Chloride 95 (*)    CO2 21 (*)    Glucose, Bld 131 (*)    Calcium 8.6 (*)    AST 146 (*)    ALT 53 (*)    Anion gap 21 (*)    All other components within normal limits  MAGNESIUM - Abnormal; Notable for the following components:   Magnesium 0.6 (*)    All other components within normal limits  SARS CORONAVIRUS 2 (TAT 6-24 HRS)  ETHANOL   ____________________________________________  EKG  Sinus rhythm with a ventricular rate of 73, normal axis, unremarkable intervals, no clear evidence of acute ischemia or significant underlying arrhythmia.  ____________________________________________   ____________________________________________   PROCEDURES  Procedure(s) performed (including Critical Care):  .1-3 Lead EKG Interpretation Performed by: Gilles Chiquito, MD Authorized by: Gilles Chiquito, MD     Interpretation: normal     ECG rate assessment: normal     Rhythm: sinus rhythm     Ectopy: none     Conduction: normal       ____________________________________________   INITIAL IMPRESSION / ASSESSMENT AND PLAN / ED COURSE      Patient presents with above to history exam for assessment after several witnessed seizures by family member that occurred earlier today.  Patient seems little bit postictal as he does not exactly recall the events of today but denies any acute symptoms.  No seizure-like  activity noted on arrival and patient has a nonfocal neuro exam.  He is afebrile and hemodynamically stable on arrival.  He is a very mild tremor but otherwise no obvious exam findings.  He appears clinically sober on my assessment and is not tachycardic or hypertensive. \  Suspect seizures may be related to alcohol withdrawal as patient's EtOH level is undetectable on labs today.  No history or exam finding suggest acute traumatic injury or acute infectious process.  CBC shows thrombocytopenia which I suspect is related patient's alcohol abuse.  Normal WBC count and hemoglobin.  Magnesium is 0.6.  CMP remarkable for K of 3.4, mild transaminitis and anion gap 21.  Suspect this is related to some starvation ketosis.  Patient stated on my reassessment that he wished to be discharged.  I explained that I was concerned he may have another seizure and recommendation to stay at least until the range of his labs come back and likely overnight observation to ensure he did not have another seizure and can be safely monitored with use of medications as needed to prevent further seizures.  Also stated with concern he was severely dehydrated likely reports of IV fluids with sugar due to starvation ketosis.  Patient said he understood this but was not interested in getting help with his drinking or staying to be observed and get electrolyte repletion and IV fluids.  He was amenable to taking p.o. magnesium and potassium before leaving.Marland Kitchen  He denies any SI or HI.  He stated he understood the risks that he could have another seizure and die.  Stated he understood he could have a life-threatening arrhythmia and had a heart attack or die from other cause.  Explained that I could offer him assistance in getting help with his alcohol but he declines at this time.  Patient discharged against my advice.  I believe he was clinically sober and aggressive make this decision at time of discharge.  I was unable to change his mind and  invite him to return immediately if he changes mind or go seek medical attention at another medical facility.  Discharged stable condition.       ____________________________________________   FINAL CLINICAL IMPRESSION(S) / ED DIAGNOSES  Final diagnoses:  Alcohol withdrawal seizure without complication (HCC)  Thrombocytopenia (HCC)  Hypomagnesemia  Hypokalemia  High anion gap metabolic acidosis    Medications  magnesium oxide (MAG-OX) tablet 800 mg (has no administration in time range)  potassium chloride SA (KLOR-CON) CR tablet 80 mEq (has no administration in time range)     ED Discharge Orders    None       Note:  This document was prepared using Dragon voice recognition software and may include unintentional dictation errors.   Gilles Chiquito, MD 09/20/20 1726    Gilles Chiquito, MD 09/20/20 [phone]

## 2020-09-20 NOTE — ED Notes (Signed)
Pt states coming in after what his wife had a seizure. Pt states he did have a little vodka today. Pt resting in bed. Side rails up for safety. Bed in low position.

## 2020-09-20 NOTE — ED Notes (Signed)
Date and time results received: 09/20/20 1730 (use smartphrase ".now" to insert current time)  Test: Magnesium Critical Value: 0.6  Name of Provider Notified: Dr. Katrinka Blazing  Orders Received? Or Actions Taken?: provider notified

## 2020-09-20 NOTE — ED Triage Notes (Signed)
Patient from home reports daily drinker, last drink yesterday, as EMS patient c/o of was sz's. MS reports wife called and reported he was having tonic clonic sz lasting about 1 min, his usual when he stops drinking. Patient arrives alert, slow to respond to questions.

## 2020-09-21 LAB — SARS CORONAVIRUS 2 (TAT 6-24 HRS): SARS Coronavirus 2: NEGATIVE

## 2021-01-11 ENCOUNTER — Encounter: Payer: Self-pay | Admitting: Medical Oncology

## 2021-01-11 ENCOUNTER — Inpatient Hospital Stay
Admission: EM | Admit: 2021-01-11 | Discharge: 2021-01-17 | DRG: 641 | Disposition: A | Discharge status: Home / Self Care | Payer: Self-pay | Attending: Obstetrics and Gynecology | Admitting: Obstetrics and Gynecology

## 2021-01-11 ENCOUNTER — Other Ambulatory Visit: Payer: Self-pay

## 2021-01-11 ENCOUNTER — Emergency Department: Payer: Self-pay

## 2021-01-11 DIAGNOSIS — E872 Acidosis: Secondary | ICD-10-CM | POA: Diagnosis present

## 2021-01-11 DIAGNOSIS — Z833 Family history of diabetes mellitus: Secondary | ICD-10-CM

## 2021-01-11 DIAGNOSIS — E039 Hypothyroidism, unspecified: Secondary | ICD-10-CM | POA: Diagnosis present

## 2021-01-11 DIAGNOSIS — K7682 Hepatic encephalopathy: Secondary | ICD-10-CM

## 2021-01-11 DIAGNOSIS — R569 Unspecified convulsions: Secondary | ICD-10-CM

## 2021-01-11 DIAGNOSIS — F32A Depression, unspecified: Secondary | ICD-10-CM | POA: Diagnosis present

## 2021-01-11 DIAGNOSIS — K7040 Alcoholic hepatic failure without coma: Secondary | ICD-10-CM | POA: Diagnosis present

## 2021-01-11 DIAGNOSIS — R4182 Altered mental status, unspecified: Secondary | ICD-10-CM

## 2021-01-11 DIAGNOSIS — E860 Dehydration: Secondary | ICD-10-CM

## 2021-01-11 DIAGNOSIS — E512 Wernicke's encephalopathy: Principal | ICD-10-CM | POA: Diagnosis present

## 2021-01-11 DIAGNOSIS — Z20822 Contact with and (suspected) exposure to covid-19: Secondary | ICD-10-CM | POA: Diagnosis present

## 2021-01-11 DIAGNOSIS — Z888 Allergy status to other drugs, medicaments and biological substances status: Secondary | ICD-10-CM

## 2021-01-11 DIAGNOSIS — Z79899 Other long term (current) drug therapy: Secondary | ICD-10-CM

## 2021-01-11 DIAGNOSIS — E876 Hypokalemia: Secondary | ICD-10-CM | POA: Diagnosis present

## 2021-01-11 DIAGNOSIS — J322 Chronic ethmoidal sinusitis: Secondary | ICD-10-CM | POA: Diagnosis present

## 2021-01-11 DIAGNOSIS — F10231 Alcohol dependence with withdrawal delirium: Secondary | ICD-10-CM

## 2021-01-11 DIAGNOSIS — E871 Hypo-osmolality and hyponatremia: Secondary | ICD-10-CM | POA: Diagnosis present

## 2021-01-11 DIAGNOSIS — F10230 Alcohol dependence with withdrawal, uncomplicated: Secondary | ICD-10-CM

## 2021-01-11 DIAGNOSIS — E890 Postprocedural hypothyroidism: Secondary | ICD-10-CM | POA: Diagnosis present

## 2021-01-11 DIAGNOSIS — Z9181 History of falling: Secondary | ICD-10-CM

## 2021-01-11 DIAGNOSIS — E46 Unspecified protein-calorie malnutrition: Secondary | ICD-10-CM | POA: Diagnosis present

## 2021-01-11 DIAGNOSIS — F17210 Nicotine dependence, cigarettes, uncomplicated: Secondary | ICD-10-CM | POA: Diagnosis present

## 2021-01-11 DIAGNOSIS — G319 Degenerative disease of nervous system, unspecified: Secondary | ICD-10-CM | POA: Diagnosis present

## 2021-01-11 DIAGNOSIS — F10239 Alcohol dependence with withdrawal, unspecified: Secondary | ICD-10-CM | POA: Diagnosis present

## 2021-01-11 DIAGNOSIS — K7460 Unspecified cirrhosis of liver: Secondary | ICD-10-CM | POA: Diagnosis present

## 2021-01-11 DIAGNOSIS — R296 Repeated falls: Secondary | ICD-10-CM | POA: Diagnosis present

## 2021-01-11 DIAGNOSIS — D638 Anemia in other chronic diseases classified elsewhere: Secondary | ICD-10-CM | POA: Diagnosis present

## 2021-01-11 DIAGNOSIS — F10930 Alcohol use, unspecified with withdrawal, uncomplicated: Secondary | ICD-10-CM

## 2021-01-11 DIAGNOSIS — Z79890 Hormone replacement therapy (postmenopausal): Secondary | ICD-10-CM

## 2021-01-11 DIAGNOSIS — F10939 Alcohol use, unspecified with withdrawal, unspecified: Secondary | ICD-10-CM

## 2021-01-11 DIAGNOSIS — D696 Thrombocytopenia, unspecified: Secondary | ICD-10-CM | POA: Diagnosis not present

## 2021-01-11 DIAGNOSIS — K709 Alcoholic liver disease, unspecified: Secondary | ICD-10-CM | POA: Diagnosis present

## 2021-01-11 DIAGNOSIS — K7290 Hepatic failure, unspecified without coma: Secondary | ICD-10-CM

## 2021-01-11 LAB — ACETAMINOPHEN LEVEL: Acetaminophen (Tylenol), Serum: <10 ug/mL — ABNORMAL LOW (ref 10–30)

## 2021-01-11 LAB — CBC WITH DIFFERENTIAL/PLATELET
Abs Immature Granulocytes: 0.14 10*3/uL — ABNORMAL HIGH (ref 0.00–0.07)
Basophils Absolute: 0 10*3/uL (ref 0.0–0.1)
Basophils Relative: 0 %
Eosinophils Absolute: 0 10*3/uL (ref 0.0–0.5)
Eosinophils Relative: 0 %
HCT: 26.4 % — ABNORMAL LOW (ref 39.0–52.0)
Hemoglobin: 9.4 g/dL — ABNORMAL LOW (ref 13.0–17.0)
Immature Granulocytes: 1 %
Lymphocytes Relative: 6 %
Lymphs Abs: 0.6 10*3/uL — ABNORMAL LOW (ref 0.7–4.0)
MCH: 37.8 pg — ABNORMAL HIGH (ref 26.0–34.0)
MCHC: 35.6 g/dL (ref 30.0–36.0)
MCV: 106 fL — ABNORMAL HIGH (ref 80.0–100.0)
Monocytes Absolute: 1.1 10*3/uL — ABNORMAL HIGH (ref 0.1–1.0)
Monocytes Relative: 11 %
Neutro Abs: 7.9 10*3/uL — ABNORMAL HIGH (ref 1.7–7.7)
Neutrophils Relative %: 82 %
Platelets: 37 10*3/uL — ABNORMAL LOW (ref 150–400)
RBC: 2.49 MIL/uL — ABNORMAL LOW (ref 4.22–5.81)
RDW: 16.2 % — ABNORMAL HIGH (ref 11.5–15.5)
WBC: 9.8 10*3/uL (ref 4.0–10.5)
nRBC: 0.3 % — ABNORMAL HIGH (ref 0.0–0.2)

## 2021-01-11 LAB — BLOOD GAS, ARTERIAL
Acid-Base Excess: 1.1 mmol/L (ref 0.0–2.0)
Bicarbonate: 23.7 mmol/L (ref 20.0–28.0)
FIO2: 0.21
O2 Saturation: 97.9 %
Patient temperature: 37
pCO2 arterial: 29 mmHg — ABNORMAL LOW (ref 32.0–48.0)
pH, Arterial: 7.52 — ABNORMAL HIGH (ref 7.350–7.450)
pO2, Arterial: 91 mmHg (ref 83.0–108.0)

## 2021-01-11 LAB — COMPREHENSIVE METABOLIC PANEL
ALT: 103 U/L — ABNORMAL HIGH (ref 0–44)
AST: 412 U/L — ABNORMAL HIGH (ref 15–41)
Albumin: 3.9 g/dL (ref 3.5–5.0)
Alkaline Phosphatase: 359 U/L — ABNORMAL HIGH (ref 38–126)
Anion gap: 37 — ABNORMAL HIGH (ref 5–15)
BUN: 9 mg/dL (ref 6–20)
CO2: 12 mmol/L — ABNORMAL LOW (ref 22–32)
Calcium: 8.1 mg/dL — ABNORMAL LOW (ref 8.9–10.3)
Chloride: 79 mmol/L — ABNORMAL LOW (ref 98–111)
Creatinine, Ser: 0.91 mg/dL (ref 0.61–1.24)
GFR, Estimated: >60 mL/min (ref >60)
Glucose, Bld: 115 mg/dL — ABNORMAL HIGH (ref 70–99)
Potassium: 4.4 mmol/L (ref 3.5–5.1)
Sodium: 128 mmol/L — ABNORMAL LOW (ref 135–145)
Total Bilirubin: 12.2 mg/dL — ABNORMAL HIGH (ref 0.3–1.2)
Total Protein: 7.6 g/dL (ref 6.5–8.1)

## 2021-01-11 LAB — MAGNESIUM: Magnesium: 1.2 mg/dL — ABNORMAL LOW (ref 1.7–2.4)

## 2021-01-11 LAB — RESP PANEL BY RT-PCR (FLU A&B, COVID) ARPGX2
Influenza A by PCR: NEGATIVE
Influenza B by PCR: NEGATIVE
SARS Coronavirus 2 by RT PCR: NEGATIVE

## 2021-01-11 LAB — AMMONIA: Ammonia: 97 umol/L — ABNORMAL HIGH (ref 9–35)

## 2021-01-11 LAB — PHOSPHORUS: Phosphorus: 1.2 mg/dL — ABNORMAL LOW (ref 2.5–4.6)

## 2021-01-11 LAB — PROTIME-INR
INR: 1.5 — ABNORMAL HIGH (ref 0.8–1.2)
Prothrombin Time: 17.8 seconds — ABNORMAL HIGH (ref 11.4–15.2)

## 2021-01-11 LAB — ETHANOL: Alcohol, Ethyl (B): <10 mg/dL (ref <10)

## 2021-01-11 LAB — LIPASE, BLOOD: Lipase: 1110 U/L — ABNORMAL HIGH (ref 11–51)

## 2021-01-11 LAB — TSH: TSH: 21.907 u[IU]/mL — ABNORMAL HIGH (ref 0.350–4.500)

## 2021-01-11 LAB — LACTIC ACID, PLASMA
Lactic Acid, Venous: >11 mmol/L (ref 0.5–1.9)
Lactic Acid, Venous: 7.6 mmol/L (ref 0.5–1.9)

## 2021-01-11 MED ORDER — LORAZEPAM 2 MG PO TABS
0.0000 mg | ORAL_TABLET | Freq: Four times a day (QID) | ORAL | Status: AC
  Administered 2021-01-12: 1 mg via ORAL
  Administered 2021-01-13 (×2): 2 mg via ORAL
  Filled 2021-01-11 (×3): qty 1

## 2021-01-11 MED ORDER — FOLIC ACID 1 MG PO TABS
1.0000 mg | ORAL_TABLET | Freq: Every day | ORAL | Status: DC
  Administered 2021-01-13 – 2021-01-17 (×5): 1 mg via ORAL
  Filled 2021-01-11 (×5): qty 1

## 2021-01-11 MED ORDER — ADULT MULTIVITAMIN W/MINERALS CH
1.0000 | ORAL_TABLET | Freq: Every day | ORAL | Status: DC
  Administered 2021-01-12 – 2021-01-17 (×6): 1 via ORAL
  Filled 2021-01-11 (×6): qty 1

## 2021-01-11 MED ORDER — SODIUM CHLORIDE 0.9 % IV BOLUS
1000.0000 mL | Freq: Once | INTRAVENOUS | Status: AC
  Administered 2021-01-11: 1000 mL via INTRAVENOUS

## 2021-01-11 MED ORDER — LORAZEPAM 2 MG/ML IJ SOLN: 0.0000 mg | Freq: Two times a day (BID) | INTRAMUSCULAR | Status: AC

## 2021-01-11 MED ORDER — MAGNESIUM SULFATE 2 GM/50ML IV SOLN
2.0000 g | Freq: Once | INTRAVENOUS | Status: AC
  Administered 2021-01-12: 2 g via INTRAVENOUS

## 2021-01-11 MED ORDER — THIAMINE HCL 100 MG PO TABS
100.0000 mg | ORAL_TABLET | Freq: Every day | ORAL | Status: DC
  Administered 2021-01-13 – 2021-01-14 (×2): 100 mg via ORAL
  Filled 2021-01-11 (×2): qty 1

## 2021-01-11 MED ORDER — KCL IN DEXTROSE-NACL 20-5-0.9 MEQ/L-%-% IV SOLN
INTRAVENOUS | Status: DC
  Filled 2021-01-11 (×3): qty 1000

## 2021-01-11 MED ORDER — SODIUM PHOSPHATES 45 MMOLE/15ML IV SOLN
30.0000 mmol | Freq: Once | INTRAVENOUS | Status: AC
  Administered 2021-01-12: 30 mmol via INTRAVENOUS
  Filled 2021-01-11: qty 10

## 2021-01-11 MED ORDER — LEVOTHYROXINE SODIUM 100 MCG PO TABS
100.0000 ug | ORAL_TABLET | Freq: Every day | ORAL | Status: DC
  Administered 2021-01-12 – 2021-01-17 (×6): 100 ug via ORAL
  Filled 2021-01-11 (×5): qty 1
  Filled 2021-01-11: qty 2

## 2021-01-11 MED ORDER — LORAZEPAM 2 MG/ML IJ SOLN
0.0000 mg | Freq: Four times a day (QID) | INTRAMUSCULAR | Status: AC
  Administered 2021-01-11 – 2021-01-12 (×4): 2 mg via INTRAVENOUS
  Administered 2021-01-13: 1 mg via INTRAVENOUS
  Filled 2021-01-11 (×5): qty 1

## 2021-01-11 MED ORDER — THIAMINE HCL 100 MG/ML IJ SOLN
100.0000 mg | Freq: Every day | INTRAMUSCULAR | Status: DC
  Administered 2021-01-11 – 2021-01-12 (×2): 100 mg via INTRAVENOUS
  Filled 2021-01-11 (×2): qty 2

## 2021-01-11 MED ORDER — LORAZEPAM 2 MG PO TABS
0.0000 mg | ORAL_TABLET | Freq: Two times a day (BID) | ORAL | Status: AC
  Administered 2021-01-14: 1 mg via ORAL
  Administered 2021-01-14 – 2021-01-15 (×2): 2 mg via ORAL
  Filled 2021-01-11 (×3): qty 1

## 2021-01-11 MED ORDER — SODIUM CHLORIDE 0.9 % IV SOLN: Freq: Once | INTRAVENOUS | Status: AC

## 2021-01-11 MED ORDER — MAGNESIUM OXIDE -MG SUPPLEMENT 400 (240 MG) MG PO TABS
400.0000 mg | ORAL_TABLET | Freq: Two times a day (BID) | ORAL | Status: DC
  Administered 2021-01-12 – 2021-01-17 (×11): 400 mg via ORAL
  Filled 2021-01-11 (×11): qty 1

## 2021-01-11 MED ORDER — LACTULOSE 10 GM/15ML PO SOLN
30.0000 g | Freq: Once | ORAL | Status: AC
  Administered 2021-01-11: 30 g via ORAL
  Filled 2021-01-11: qty 60

## 2021-01-11 MED ORDER — FOLIC ACID 1 MG PO TABS
1.0000 mg | ORAL_TABLET | Freq: Once | ORAL | Status: AC
  Administered 2021-01-11: 1 mg via ORAL
  Filled 2021-01-11: qty 1

## 2021-01-11 NOTE — H&P (Addendum)
History and Physical    Joshua Medina SJG:[id number] DOB: 07-09-1986 DOA: 01/11/2021  Referring MD/NP/PA:   PCP: Pcp, No   Patient coming from:  The patient is coming from home.  At baseline, pt is independent for most of ADL.        Chief Complaint: Seizure  HPI: Joshua Medina is a 35 y.o. male with medical history significant of malnutrition, hypothyroidism, alcohol liver disease, alcohol withdrawal seizure, delirium presented with seizure, altered mental status  When I saw patient, patient is lethargic, not good historian, oriented to time and people, he thought he is in [place], he could not recall seizure episode, does report last drinking 2 days ago, reported to have some loose stool yesterday, otherwise patient reports no  fever, chills, chest pain, shortness of breath, cough, abdominal pain,  dysuria, leg swelling.  Per Chart review, EMS was reported from family that patient may had a seizure, EMS found the patient has been shaking/tremors,  patient was sent to the ED   ED Course: pt was found to have   temperature 97.6, heart rate 113-122, respiratory 16, blood pressure 101/82, oxygen saturation 100% on room air.  Labs show sodium sodium 128, chloride 79, bicarb 12 glucose 115, iron 77, lipase 1100 AST 412,  ALT 103, ammonia 97, total bilirubin 12.2, hemoglobin 9.4, platelets 37, INR 1.5, acetaminophen level less than 10, influenza negative/negative, alcohol level less than 10 CT head: No evidence of acute intracranial abnormality. Age advanced cerebral and cerebellar atrophy. Mild right ethmoid sinusitis.  In ED patient was give folic acid, thiamine,  lactulose, Ativan Hospitalist was requested to admit patient    Review of Systems:   General: no fevers, chills, no body weight gain, has poor appetite, has fatigue HEENT: no blurry vision, hearing changes or sore throat Respiratory: no dyspnea, coughing, wheezing CV: no chest pain, no palpitations GI: no nausea, vomiting,  abdominal pain, diarrhea, constipation GU: no dysuria, burning on urination, increased urinary frequency, hematuria  Ext: no leg edema Neuro: no unilateral weakness, numbness, or tingling, no vision change or hearing loss Skin: no rash, no skin tear. MSK: No muscle spasm, no deformity, no limitation of range of movement in spin Heme: No easy bruising.  Travel history: No recent long distant travel.  Allergy:  Allergies  Allergen Reactions  . Precedex [Dexmedetomidine Hcl In Nacl] Other (See Comments)    Past Medical History:  Diagnosis Date  . Graves disease   . Thyroid disease     Past Surgical History:  Procedure Laterality Date  . thyroid ablation with radioactive I-131      Social History:  reports that he has been smoking cigarettes. He has never used smokeless tobacco. He reports current alcohol use. He reports that he does not use drugs.  Family History:  Family History  Problem Relation Age of Onset  . Diabetes Paternal Grandmother      Prior to Admission medications   Medication Sig Start Date End Date Taking? Authorizing Provider  feeding supplement, ENSURE ENLIVE, (ENSURE ENLIVE) LIQD Take 237 mLs by mouth 2 (two) times daily between meals. Patient not taking: Reported on 06/13/2020 02/20/20   Elease Etienne, MD  folic acid (FOLVITE) 1 MG tablet Take 1 tablet (1 mg total) by mouth daily. Patient not taking: Reported on 06/05/2020 02/21/20   Elease Etienne, MD  levothyroxine (SYNTHROID) 100 MCG tablet Take 1 tablet (100 mcg total) by mouth daily. Patient not taking: Reported on 06/05/2020 02/20/20 04/03/20  Marcellus Perri  D, MD  magnesium 30 MG tablet Take 1 tablet (30 mg total) by mouth 2 (two) times daily. 06/11/20   Marrion Coy, MD  Multiple Vitamin (MULTIVITAMIN WITH MINERALS) TABS tablet Take 1 tablet by mouth daily. Patient not taking: Reported on 06/05/2020 02/21/20   Elease Etienne, MD  nicotine (NICODERM CQ - DOSED IN MG/24 HOURS) 21 mg/24hr patch  Place 1 patch (21 mg total) onto the skin daily. Patient not taking: Reported on 06/05/2020 02/21/20   Elease Etienne, MD  OLANZapine zydis (ZYPREXA) 5 MG disintegrating tablet Take 0.5 tablets (2.5 mg total) by mouth at bedtime. Patient not taking: Reported on 06/05/2020 02/20/20   Elease Etienne, MD  thiamine 100 MG tablet Take 1 tablet (100 mg total) by mouth daily. Patient not taking: Reported on 06/05/2020 02/21/20   Elease Etienne, MD    Physical Exam: Vitals:   01/11/21 1634 01/11/21 1730 01/11/21 1800 01/11/21 1938  BP: 137/70 103/71 112/85 101/82  Pulse: (!) 113 (!) 122 (!) 113   Resp: 20 17 14 16   Temp: 97.6 F (36.4 C)     TempSrc: Oral     SpO2: 100% 100% 100% 100%  Weight: 63 kg     Height: 5\' 8"  (1.727 m)      General: Not in acute distress HEENT:       Eyes: PERRL, EOMI, no scleral icterus.       ENT: No discharge from the ears and nose, no pharynx injection, no tonsillar enlargement.        Neck: No JVD, no bruit, no mass felt. Heme: No neck lymph node enlargement. Cardiac: S1/S2, RRR, No murmurs, No gallops or rubs. Respiratory: Good air movement bilaterally. No rales, wheezing, rhonchi or rubs. GI: Soft, nondistended, nontender, no rebound pain, no organomegaly, BS present. GU: No hematuria Ext: No pitting leg edema bilaterally. 2+DP/PT pulse bilaterally. Musculoskeletal: No joint deformities, No joint redness or warmth, no limitation of ROM in spin. Skin: No rashes.  Neuro: Lethargic,  oriented X2, freely move 4 extremities, no focal neurodeficit   Labs on Admission: I have personally reviewed following labs and imaging studies  CBC: Recent Labs  Lab 01/11/21 1633  WBC 9.8  NEUTROABS 7.9*  HGB 9.4*  HCT 26.4*  MCV 106.0*  PLT 37*   Basic Metabolic Panel: Recent Labs  Lab 01/11/21 1633  NA 128*  K 4.4  CL 79*  CO2 12*  GLUCOSE 115*  BUN 9  CREATININE 0.91  CALCIUM 8.1*   GFR: Estimated Creatinine Clearance: 100 mL/min (by C-G  formula based on SCr of 0.91 mg/dL). Liver Function Tests: Recent Labs  Lab 01/11/21 1633  AST 412*  ALT 103*  ALKPHOS 359*  BILITOT 12.2*  PROT 7.6  ALBUMIN 3.9   Recent Labs  Lab 01/11/21 1633  LIPASE 1,110*   Recent Labs  Lab 01/11/21 1633  AMMONIA 97*   Coagulation Profile: Recent Labs  Lab 01/11/21 1633  INR 1.5*   Cardiac Enzymes: No results for input(s): CKTOTAL, CKMB, CKMBINDEX, TROPONINI in the last 168 hours. BNP (last 3 results) No results for input(s): PROBNP in the last 8760 hours. HbA1C: No results for input(s): HGBA1C in the last 72 hours. CBG: No results for input(s): GLUCAP in the last 168 hours. Lipid Profile: No results for input(s): CHOL, HDL, LDLCALC, TRIG, CHOLHDL, LDLDIRECT in the last 72 hours. Thyroid Function Tests: No results for input(s): TSH, T4TOTAL, FREET4, T3FREE, THYROIDAB in the last 72 hours. Anemia Panel: No  results for input(s): VITAMINB12, FOLATE, FERRITIN, TIBC, IRON, RETICCTPCT in the last 72 hours. Urine analysis:    Component Value Date/Time   COLORURINE AMBER (A) 02/16/2020 0446   APPEARANCEUR HAZY (A) 02/16/2020 0446   LABSPEC 1.011 02/16/2020 0446   PHURINE 7.0 02/16/2020 0446   GLUCOSEU NEGATIVE 02/16/2020 0446   HGBUR NEGATIVE 02/16/2020 0446   BILIRUBINUR SMALL (A) 02/16/2020 0446   KETONESUR NEGATIVE 02/16/2020 0446   PROTEINUR 100 (A) 02/16/2020 0446   NITRITE NEGATIVE 02/16/2020 0446   LEUKOCYTESUR NEGATIVE 02/16/2020 0446   Sepsis Labs: @LABRCNTIP (procalcitonin:4,lacticidven:4) ) Recent Results (from the past 240 hour(s))  Resp Panel by RT-PCR (Flu A&B, Covid) Nasopharyngeal Swab     Status: None   Collection Time: 01/11/21  4:33 PM   Specimen: Nasopharyngeal Swab; Nasopharyngeal(NP) swabs in vial transport medium  Result Value Ref Range Status   SARS Coronavirus 2 by RT PCR NEGATIVE NEGATIVE Final    Comment: (NOTE) SARS-CoV-2 target nucleic acids are NOT DETECTED.  The SARS-CoV-2 RNA is  generally detectable in upper respiratory specimens during the acute phase of infection. The lowest concentration of SARS-CoV-2 viral copies this assay can detect is 138 copies/mL. A negative result does not preclude SARS-Cov-2 infection and should not be used as the sole basis for treatment or other patient management decisions. A negative result may occur with  improper specimen collection/handling, submission of specimen other than nasopharyngeal swab, presence of viral mutation(s) within the areas targeted by this assay, and inadequate number of viral copies(<138 copies/mL). A negative result must be combined with clinical observations, patient history, and epidemiological information. The expected result is Negative.  Fact Sheet for Patients:  BloggerCourse.comhttps://www.fda.gov/media/152166/download  Fact Sheet for Healthcare Providers:  SeriousBroker.ithttps://www.fda.gov/media/152162/download  This test is no t yet approved or cleared by the [place] FDA and  has been authorized for detection and/or diagnosis of SARS-CoV-2 by FDA under an Emergency Use Authorization (EUA). This EUA will remain  in effect (meaning this test can be used) for the duration of the COVID-19 declaration under Section 564(b)(1) of the Act, 21 U.S.C.section 360bbb-3(b)(1), unless the authorization is terminated  or revoked sooner.       Influenza A by PCR NEGATIVE NEGATIVE Final   Influenza B by PCR NEGATIVE NEGATIVE Final    Comment: (NOTE) The Xpert Xpress SARS-CoV-2/FLU/RSV plus assay is intended as an aid in the diagnosis of influenza from Nasopharyngeal swab specimens and should not be used as a sole basis for treatment. Nasal washings and aspirates are unacceptable for Xpert Xpress SARS-CoV-2/FLU/RSV testing.  Fact Sheet for Patients: BloggerCourse.comhttps://www.fda.gov/media/152166/download  Fact Sheet for Healthcare Providers: SeriousBroker.ithttps://www.fda.gov/media/152162/download  This test is not yet approved or cleared by the [place] FDA and has been authorized for detection and/or diagnosis of SARS-CoV-2 by FDA under an Emergency Use Authorization (EUA). This EUA will remain in effect (meaning this test can be used) for the duration of the COVID-19 declaration under Section 564(b)(1) of the Act, 21 U.S.C. section 360bbb-3(b)(1), unless the authorization is terminated or revoked.  Performed at [facility], [address] [place], [place] [address]      Radiological Exams on Admission: CT Head Wo Contrast  Result Date: 01/11/2021 CLINICAL DATA:  Mental status change, unknown cause. Additional history provided: Reported seizure. EXAM: CT HEAD WITHOUT CONTRAST TECHNIQUE: Contiguous axial images were obtained from the base of the skull through the vertex without intravenous contrast. COMPARISON:  Prior head CT examinations 06/13/2020 and earlier. FINDINGS: Brain: Redemonstrated age advanced cerebral and cerebellar  atrophy. There is no acute intracranial hemorrhage. No demarcated cortical infarct. No extra-axial fluid collection. No evidence of intracranial mass. No midline shift. Vascular: No hyperdense vessel. Skull: Normal. Negative for fracture or focal lesion. Sinuses/Orbits: Visualized orbits show no acute finding. Small volume fluid within an anterior right ethmoid air cell. IMPRESSION: No evidence of acute intracranial abnormality. Age advanced cerebral and cerebellar atrophy. Mild right ethmoid sinusitis. Electronically Signed   By: Jackey Loge DO   On: 01/11/2021 18:59     EKG: Independently reviewed.  Not done in ED, will get one.   Assessment/Plan Active Problems:   * No active hospital problems. *  medical history significant of malnutrition, hypothyroidism, alcohol liver disease, alcohol withdrawal seizure, delirium presented with seizure, altered mental status   #Altered mental status: Likely multifactorial, postictal status alcohol withdrawal seizure versus?  Hepatic  encephalopathy Current protective airway Close monitoring at stepdown unit ICU consult if warranted  #Alcohol abuse, alcohol withdrawal seizure: Patient's seizure is most likely due to alcohol withdrawal seizure.  Patient is a heavy drinker and had hx of withdrawal seizures.  Last drink 2 days ago Ct-head is negative for acute intracranial abnormalities.  No focal neurological findings on physical examination. - Admit to stepdown inpatient. - CIWA protocol - Thiamine & Folate - IVF - Seizure precaution - When necessary Ativan for seizure  #Alcoholic liver disease,?  Liver cirrhosis:  MEL D sodium score 26, 90 days mortality 14-15% Maddrey'ss discrimination function 24.2, no indication for steroids  # hyponatremia: Likely potomania secondary to alcohol abuse Close monitoring, IV fluid Goal to correct 8-12 M EQ per 24 hours  #Elevated ammonia,?  Hepatic encephalopathy: Likely chronic, 96 on 06/14/1999 ED started on lactulose, will continue To follow ammonia AM  #elevated lipase: Etiology unclear, concern for acute pancreatitis We will keep patient n.p.o. Trending lipase CT abdomen pelvics if warranted  #AGMA: Etiology unclear, most likely secondary lactic acid secondary to seizure Cannot rule out digests other substance such as ethylene glycol, but unlikely Close monitoring  #Hypothyroidism: TSH pending  #medical history significant of malnutrition, hypothyroidism, alcohol liver disease, alcohol withdrawal seizure, delirium presented with seizure, altered mental status Established Active problems see above Continue home medications if appropriate follow-up with his PCP, specialist when necessary  Addendum at 9:07 PM: Lactic acid more than 11, likely due to seizure and liver failure , expected Close  monitoring and repeat pending  #Addendum at 11:34 PM: Mag 1.2, Phos 1.2 Replenished.  Repeat Phos mag in a.m.  DVT ppx: SCD Code Status: Full code Family Communication:  None at bed side.              Disposition Plan:  Anticipate discharge back to previous home environment Consults called:   Admission status:   SDU/inpation       Date of Service 01/11/2021    Rayne Du [facility]   If 7PM-7AM, please contact night-coverage [email or web address] Password [email or web address] 01/11/2021, 7:52 PM

## 2021-01-11 NOTE — ED Notes (Signed)
edp at bedside  

## 2021-01-11 NOTE — ED Triage Notes (Signed)
PT from home via ems with reports from family that pt may have had a seizure, per ems pt has been shaking/tremors, not postictal upon their arrival. Pt A/O x 2. Pt is heavy daily drinker however reports that he has not had a drink in 2 days. Notable jaundice to eyes.

## 2021-01-11 NOTE — ED Notes (Signed)
Pt tremulous, given ativan and will repeat EKG when tremors have lessened.

## 2021-01-11 NOTE — ED Provider Notes (Signed)
[facility] Emergency Department Provider Note   ____________________________________________   Event Date/Time   First MD Initiated Contact with Patient 01/11/21 1635     (approximate)  I have reviewed the triage vital signs and the nursing notes.   HISTORY  Chief Complaint Withdrawal and Altered Mental Status    HPI Joshua Medina is a 35 y.o. male with below stated past medical history as well as daily alcohol abuse who presents via EMS from home after a reported episode of seizure-like activity.  Per EMS, they found patient to be tremulous but not exactly postictal on their arrival however they do note he was only alert and oriented x2.  Patient states that he is only here due to weakness and states that he has had multiple falls over the last few days.  Patient states that his last drink was yesterday however per EMS report patient's last drink was 2 days prior to arrival.  Patient does state that he has a history of complex withdrawals including hallucinations but denies ever being told that he had withdrawal seizures in the past.  Patient currently denies any vision changes, tinnitus, difficulty speaking, facial droop, sore throat, chest pain, shortness of breath, abdominal pain, nausea/vomiting/diarrhea, dysuria, or numbness/paresthesias in any extremity         Past Medical History:  Diagnosis Date  . Graves disease   . Thyroid disease     Patient Active Problem List   Diagnosis Date Noted  . Altered mental status 01/11/2021  . Hyponatremia 01/11/2021  . Hypotension 06/10/2020  . Hypoglycemia without diagnosis of diabetes mellitus 06/10/2020  . Alcohol withdrawal seizure (HCC) 06/04/2020  . Malnutrition of moderate degree 02/17/2020  . Hypomagnesemia 02/16/2020  . Delirium tremens (HCC) 02/16/2020  . Alcohol use disorder, moderate, dependence (HCC) 02/15/2020  . Hypothyroidism (acquired) 02/15/2020  . Alcoholic liver disease (HCC)  29/56/213006/27/2021  . Alcohol withdrawal seizure with complication, with delirium (HCC) 02/15/2020  . Alcohol withdrawal seizure, with delirium (HCC) 02/15/2020  . Thrombocytopenia (HCC) 02/15/2020  . Anemia 02/15/2020  . Hypokalemia 02/15/2020    Past Surgical History:  Procedure Laterality Date  . thyroid ablation with radioactive I-131      Prior to Admission medications   Medication Sig Start Date End Date Taking? Authorizing Provider  feeding supplement, ENSURE ENLIVE, (ENSURE ENLIVE) LIQD Take 237 mLs by mouth 2 (two) times daily between meals. Patient not taking: Reported on 06/13/2020 02/20/20   Elease EtienneHongalgi, Anand D, MD  folic acid (FOLVITE) 1 MG tablet Take 1 tablet (1 mg total) by mouth daily. Patient not taking: Reported on 06/05/2020 02/21/20   Elease EtienneHongalgi, Anand D, MD  levothyroxine (SYNTHROID) 100 MCG tablet Take 1 tablet (100 mcg total) by mouth daily. Patient not taking: Reported on 06/05/2020 02/20/20 04/03/20  Elease EtienneHongalgi, Anand D, MD  magnesium 30 MG tablet Take 1 tablet (30 mg total) by mouth 2 (two) times daily. 06/11/20   Marrion CoyZhang, Dekui, MD  Multiple Vitamin (MULTIVITAMIN WITH MINERALS) TABS tablet Take 1 tablet by mouth daily. Patient not taking: Reported on 06/05/2020 02/21/20   Elease EtienneHongalgi, Anand D, MD  nicotine (NICODERM CQ - DOSED IN MG/24 HOURS) 21 mg/24hr patch Place 1 patch (21 mg total) onto the skin daily. Patient not taking: Reported on 06/05/2020 02/21/20   Elease EtienneHongalgi, Anand D, MD  OLANZapine zydis (ZYPREXA) 5 MG disintegrating tablet Take 0.5 tablets (2.5 mg total) by mouth at bedtime. Patient not taking: Reported on 06/05/2020 02/20/20   Elease EtienneHongalgi, Anand D, MD  thiamine  100 MG tablet Take 1 tablet (100 mg total) by mouth daily. Patient not taking: Reported on 06/05/2020 02/21/20   Elease Etienne, MD    Allergies Precedex [dexmedetomidine hcl in nacl]  Family History  Problem Relation Age of Onset  . Diabetes Paternal Grandmother     Social History Social History   Tobacco  Use  . Smoking status: Current Every Day Smoker    Types: Cigarettes  . Smokeless tobacco: Never Used  Vaping Use  . Vaping Use: Never used  Substance Use Topics  . Alcohol use: Yes  . Drug use: Never    Review of Systems Constitutional: No fever/chills Eyes: No visual changes. ENT: No sore throat. Cardiovascular: Denies chest pain. Respiratory: Denies shortness of breath. Gastrointestinal: No abdominal pain.  No nausea, no vomiting.  No diarrhea. Genitourinary: Negative for dysuria. Musculoskeletal: Negative for acute arthralgias Skin: Negative for rash. Neurological: Endorses generalized weakness. Negative for headaches, numbness/paresthesias in any extremity Psychiatric: Negative for suicidal ideation/homicidal ideation   ____________________________________________   PHYSICAL EXAM:  VITAL SIGNS: ED Triage Vitals [01/11/21 1634]  Enc Vitals Group     BP 137/70     Pulse Rate (!) 113     Resp 20     Temp 97.6 F (36.4 C)     Temp Source Oral     SpO2 100 %     Weight 138 lb 14.2 oz (63 kg)     Height 5\' 8"  (1.727 m)     Head Circumference      Peak Flow      Pain Score 0     Pain Loc      Pain Edu?      Excl. in GC?    Constitutional: Alert and oriented. Well appearing and in no acute distress. Eyes: Conjunctivae are injected and icteric. PERRL.  Right eye with purulent discharge and crusting at the medial canthus Head: Atraumatic. Nose: No congestion/rhinnorhea. Mouth/Throat: Mucous membranes are moist. Neck: No stridor Cardiovascular: Grossly normal heart sounds.  Good peripheral circulation. Respiratory: Normal respiratory effort.  No retractions. Gastrointestinal: Soft and nontender. No distention. Musculoskeletal: No obvious deformities Neurologic:  Normal speech and language. No gross focal neurologic deficits are appreciated. Skin:  Skin is jaundiced, warm and dry. No rash noted. Psychiatric: Mood and affect are normal. Speech and behavior are  normal.  ____________________________________________   LABS (all labs ordered are listed, but only abnormal results are displayed)  Labs Reviewed  COMPREHENSIVE METABOLIC PANEL - Abnormal; Notable for the following components:      Result Value   Sodium 128 (*)    Chloride 79 (*)    CO2 12 (*)    Glucose, Bld 115 (*)    Calcium 8.1 (*)    AST 412 (*)    ALT 103 (*)    Alkaline Phosphatase 359 (*)    Total Bilirubin 12.2 (*)    Anion gap 37 (*)    All other components within normal limits  LIPASE, BLOOD - Abnormal; Notable for the following components:   Lipase 1,110 (*)    All other components within normal limits  ACETAMINOPHEN LEVEL - Abnormal; Notable for the following components:   Acetaminophen (Tylenol), Serum <10 (*)    All other components within normal limits  CBC WITH DIFFERENTIAL/PLATELET - Abnormal; Notable for the following components:   RBC 2.49 (*)    Hemoglobin 9.4 (*)    HCT 26.4 (*)    MCV 106.0 (*)    [facility]  37.8 (*)    RDW 16.2 (*)    Platelets 37 (*)    nRBC 0.3 (*)    Neutro Abs 7.9 (*)    Lymphs Abs 0.6 (*)    Monocytes Absolute 1.1 (*)    Abs Immature Granulocytes 0.14 (*)    All other components within normal limits  AMMONIA - Abnormal; Notable for the following components:   Ammonia 97 (*)    All other components within normal limits  PROTIME-INR - Abnormal; Notable for the following components:   Prothrombin Time 17.8 (*)    INR 1.5 (*)    All other components within normal limits  LACTIC ACID, PLASMA - Abnormal; Notable for the following components:   Lactic Acid, Venous >11.0 (*)    All other components within normal limits  RESP PANEL BY RT-PCR (FLU A&B, COVID) ARPGX2  ETHANOL  URINALYSIS, COMPLETE (UACMP) WITH MICROSCOPIC  BLOOD GAS, ARTERIAL  LACTIC ACID, PLASMA  TSH  COMPREHENSIVE METABOLIC PANEL  CBC  LIPASE, BLOOD  AMYLASE  AMMONIA  MAGNESIUM  PHOSPHORUS  LACTIC ACID, PLASMA    ____________________________________________  EKG  ED ECG REPORT I, Merwyn Katos, the attending physician, personally viewed and interpreted this ECG.  Date: 01/11/2021 EKG Time: 1751 Rate: 127 Rhythm: Tachycardic sinus rhythm QRS Axis: normal Intervals: normal ST/T Wave abnormalities: normal Narrative Interpretation: no evidence of acute ischemia  ____________________________________________  RADIOLOGY  ED MD interpretation: CT of the head without contrast shows no evidence of acute abnormalities including no intracerebral hemorrhage, obvious masses, or significant edema  Official radiology report(s): CT Head Wo Contrast  Result Date: 01/11/2021 CLINICAL DATA:  Mental status change, unknown cause. Additional history provided: Reported seizure. EXAM: CT HEAD WITHOUT CONTRAST TECHNIQUE: Contiguous axial images were obtained from the base of the skull through the vertex without intravenous contrast. COMPARISON:  Prior head CT examinations 06/13/2020 and earlier. FINDINGS: Brain: Redemonstrated age advanced cerebral and cerebellar atrophy. There is no acute intracranial hemorrhage. No demarcated cortical infarct. No extra-axial fluid collection. No evidence of intracranial mass. No midline shift. Vascular: No hyperdense vessel. Skull: Normal. Negative for fracture or focal lesion. Sinuses/Orbits: Visualized orbits show no acute finding. Small volume fluid within an anterior right ethmoid air cell. IMPRESSION: No evidence of acute intracranial abnormality. Age advanced cerebral and cerebellar atrophy. Mild right ethmoid sinusitis. Electronically Signed   By: Jackey Loge DO   On: 01/11/2021 18:59    ____________________________________________   PROCEDURES  Procedure(s) performed (including Critical Care):  .1-3 Lead EKG Interpretation Performed by: Merwyn Katos, MD Authorized by: Merwyn Katos, MD     Interpretation: abnormal     ECG rate:  121   ECG rate assessment:  tachycardic     Rhythm: sinus tachycardia     Ectopy: none     Conduction: normal   .Critical Care Performed by: Merwyn Katos, MD Authorized by: Merwyn Katos, MD   Critical care provider statement:    Critical care time (minutes):  41   Critical care time was exclusive of:  Separately billable procedures and treating other patients   Critical care was necessary to treat or prevent imminent or life-threatening deterioration of the following conditions:  CNS failure or compromise   Critical care was time spent personally by me on the following activities:  Discussions with consultants, evaluation of patient's response to treatment, examination of patient, ordering and performing treatments and interventions, ordering and review of laboratory studies, ordering and review of radiographic studies, pulse  oximetry, re-evaluation of patient's condition, obtaining history from patient or surrogate and review of old charts   I assumed direction of critical care for this patient from another provider in my specialty: no     Care discussed with: admitting provider       ____________________________________________   INITIAL IMPRESSION / ASSESSMENT AND PLAN / ED COURSE  As part of my medical decision making, I reviewed the following data within the electronic MEDICAL RECORD NUMBER Nursing notes reviewed and incorporated, Labs reviewed, EKG interpreted, Old chart reviewed, Radiograph reviewed and Notes from prior ED visits reviewed and incorporated        Patient presents for altered mental status of unknown origin  Will obtain medical workup and discuss with family social work to try to obtain collateral information.  Given History, Physical, and Workup there is no overt concern for a dangerous emergent cause such as, but not limited to, CNS infection, severe Toxidrome, severe metabolic derangement, or stroke.  There is evidence of likely hepatic encephalopathy as well as possible postictal  state given that he has quit drinking alcohol  Disposition: Admit; the patient is suffering altered mental status that is persistent and therefore they will be admitted.      ____________________________________________   FINAL CLINICAL IMPRESSION(S) / ED DIAGNOSES  Final diagnoses:  Altered mental status, unspecified altered mental status type  Hepatic encephalopathy (HCC)  Dehydration  Alcohol withdrawal seizure without complication [facility])     ED Discharge Orders    None       Note:  This document was prepared using Dragon voice recognition software and may include unintentional dictation errors.   Merwyn Katos, MD 01/11/21 2149

## 2021-01-11 NOTE — ED Notes (Signed)
Pt taken to CT.

## 2021-01-11 NOTE — ED Notes (Signed)
Lab called to add on blue top that was sent on arrival.

## 2021-01-12 ENCOUNTER — Inpatient Hospital Stay: Payer: Self-pay

## 2021-01-12 LAB — COMPREHENSIVE METABOLIC PANEL
ALT: 87 U/L — ABNORMAL HIGH (ref 0–44)
ALT: 90 U/L — ABNORMAL HIGH (ref 0–44)
AST: 324 U/L — ABNORMAL HIGH (ref 15–41)
AST: 324 U/L — ABNORMAL HIGH (ref 15–41)
Albumin: 3.1 g/dL — ABNORMAL LOW (ref 3.5–5.0)
Albumin: 3 g/dL — ABNORMAL LOW (ref 3.5–5.0)
Alkaline Phosphatase: 258 U/L — ABNORMAL HIGH (ref 38–126)
Alkaline Phosphatase: 293 U/L — ABNORMAL HIGH (ref 38–126)
Anion gap: 15 (ref 5–15)
Anion gap: 14 (ref 5–15)
BUN: 8 mg/dL (ref 6–20)
BUN: 7 mg/dL (ref 6–20)
CO2: 27 mmol/L (ref 22–32)
CO2: 26 mmol/L (ref 22–32)
Calcium: 7 mg/dL — ABNORMAL LOW (ref 8.9–10.3)
Calcium: 7 mg/dL — ABNORMAL LOW (ref 8.9–10.3)
Chloride: 88 mmol/L — ABNORMAL LOW (ref 98–111)
Chloride: 93 mmol/L — ABNORMAL LOW (ref 98–111)
Creatinine, Ser: 0.68 mg/dL (ref 0.61–1.24)
Creatinine, Ser: 0.47 mg/dL — ABNORMAL LOW (ref 0.61–1.24)
GFR, Estimated: >60 mL/min (ref >60)
GFR, Estimated: >60 mL/min (ref >60)
Glucose, Bld: 242 mg/dL — ABNORMAL HIGH (ref 70–99)
Glucose, Bld: 166 mg/dL — ABNORMAL HIGH (ref 70–99)
Potassium: 2.7 mmol/L — CL (ref 3.5–5.1)
Potassium: 2.9 mmol/L — ABNORMAL LOW (ref 3.5–5.1)
Sodium: 130 mmol/L — ABNORMAL LOW (ref 135–145)
Sodium: 133 mmol/L — ABNORMAL LOW (ref 135–145)
Total Bilirubin: 11 mg/dL — ABNORMAL HIGH (ref 0.3–1.2)
Total Bilirubin: 10.8 mg/dL — ABNORMAL HIGH (ref 0.3–1.2)
Total Protein: 6.2 g/dL — ABNORMAL LOW (ref 6.5–8.1)
Total Protein: 5.9 g/dL — ABNORMAL LOW (ref 6.5–8.1)

## 2021-01-12 LAB — CBC
HCT: 20.6 % — ABNORMAL LOW (ref 39.0–52.0)
Hemoglobin: 7.4 g/dL — ABNORMAL LOW (ref 13.0–17.0)
MCH: 37.4 pg — ABNORMAL HIGH (ref 26.0–34.0)
MCHC: 35.9 g/dL (ref 30.0–36.0)
MCV: 104 fL — ABNORMAL HIGH (ref 80.0–100.0)
Platelets: 36 10*3/uL — ABNORMAL LOW (ref 150–400)
RBC: 1.98 MIL/uL — ABNORMAL LOW (ref 4.22–5.81)
RDW: 16.5 % — ABNORMAL HIGH (ref 11.5–15.5)
WBC: 8.2 10*3/uL (ref 4.0–10.5)
nRBC: 0.6 % — ABNORMAL HIGH (ref 0.0–0.2)

## 2021-01-12 LAB — AMMONIA: Ammonia: 37 umol/L — ABNORMAL HIGH (ref 9–35)

## 2021-01-12 LAB — LIPASE, BLOOD: Lipase: 650 U/L — ABNORMAL HIGH (ref 11–51)

## 2021-01-12 LAB — PHOSPHORUS: Phosphorus: 1 mg/dL — CL (ref 2.5–4.6)

## 2021-01-12 LAB — LACTIC ACID, PLASMA
Lactic Acid, Venous: 4 mmol/L (ref 0.5–1.9)
Lactic Acid, Venous: 2.7 mmol/L (ref 0.5–1.9)

## 2021-01-12 LAB — MAGNESIUM: Magnesium: 1.7 mg/dL (ref 1.7–2.4)

## 2021-01-12 LAB — AMYLASE: Amylase: 256 U/L — ABNORMAL HIGH (ref 28–100)

## 2021-01-12 MED ORDER — PANTOPRAZOLE SODIUM 40 MG PO TBEC
40.0000 mg | DELAYED_RELEASE_TABLET | Freq: Every day | ORAL | Status: DC
  Administered 2021-01-12 – 2021-01-17 (×6): 40 mg via ORAL
  Filled 2021-01-12 (×6): qty 1

## 2021-01-12 MED ORDER — LACTULOSE 10 GM/15ML PO SOLN
20.0000 g | Freq: Every day | ORAL | Status: DC
  Administered 2021-01-12 – 2021-01-15 (×4): 20 g via ORAL
  Filled 2021-01-12 (×4): qty 30

## 2021-01-12 MED ORDER — POTASSIUM CHLORIDE 10 MEQ/100ML IV SOLN
10.0000 meq | INTRAVENOUS | Status: AC
  Administered 2021-01-12 (×4): 10 meq via INTRAVENOUS
  Filled 2021-01-12 (×4): qty 100

## 2021-01-12 MED ORDER — POTASSIUM CHLORIDE IN NACL 40-0.9 MEQ/L-% IV SOLN
INTRAVENOUS | Status: DC
  Filled 2021-01-12 (×5): qty 1000

## 2021-01-12 MED ORDER — MAGNESIUM SULFATE 2 GM/50ML IV SOLN
2.0000 g | Freq: Once | INTRAVENOUS | Status: AC
  Administered 2021-01-12: 2 g via INTRAVENOUS
  Filled 2021-01-12: qty 50

## 2021-01-12 MED ORDER — VITAMIN K1 10 MG/ML IJ SOLN
5.0000 mg | Freq: Once | INTRAVENOUS | Status: AC
  Administered 2021-01-12: 5 mg via INTRAVENOUS
  Filled 2021-01-12: qty 0.5

## 2021-01-12 NOTE — Progress Notes (Addendum)
PROGRESS NOTE    Joshua Medina  JYN:[id number] DOB: 08/03/1986 DOA: 01/11/2021 PCP: Pcp, No  Outpatient Specialists: none    Brief Narrative:    Joshua Medina is a 35 y.o. male with medical history significant of malnutrition, hypothyroidism, alcohol liver disease, alcohol withdrawal seizure, delirium presented with seizure, altered mental status  When I saw patient, patient is lethargic, not good historian, oriented to time and people, he thought he is in [place], he could not recall seizure episode, does report last drinking 2 days ago, reported to have some loose stool yesterday, otherwise patient reports no  fever, chills, chest pain, shortness of breath, cough, abdominal pain,  dysuria, leg swelling.  Per Chart review, EMS was reported from family that patient may had a seizure, EMS found the patient has been shaking/tremors,  patient was sent to the ED   ED Course: pt was found to have   temperature 97.6, heart rate 113-122, respiratory 16, blood pressure 101/82, oxygen saturation 100% on room air.  Labs show sodium sodium 128, chloride 79, bicarb 12 glucose 115, iron 77, lipase 1100 AST 412,  ALT 103, ammonia 97, total bilirubin 12.2, hemoglobin 9.4, platelets 37, INR 1.5, acetaminophen level less than 10, influenza negative/negative, alcohol level less than 10 CT head: No evidence of acute intracranial abnormality. Age advanced cerebral and cerebellar atrophy. Mild right ethmoid sinusitis.  In ED patient was give folic acid, thiamine,  lactulose, Ativan Hospitalist was requested to admit patient   Assessment & Plan:   Active Problems:   Hypothyroidism (acquired)   Alcoholic liver disease (HCC)   Alcohol withdrawal seizure (HCC)   Altered mental status   Hyponatremia   # Acute encephalopathy  Likely multifactorial, postictal status alcohol withdrawal seizure versus? Hepatic encephalopathy. Protecting airway - close monitoring, step-down status, treating as  below  #Alcohol abuse, alcohol withdrawal seizure: Patient's seizure is most likely due to alcohol withdrawal seizure.  Patient is a heavy drinker and had hx of withdrawal seizures. Last drink 2 days ago. Ct-head is negative for acute intracranial abnormalities. No focal neurological findings on physical examination. Required precedex, pressors in icu last hospitalization. - stepdown - CIWA protocol - Thiamine & Folate - IVF - Seizure precaution - When necessary Ativan for seizure  #Alcoholic liver disease,?  Liver cirrhosis. Thrombocytopenia MEL D sodium score 26, 90 days mortality 14-15% Maddrey'ss discrimination function 24.2, no indication for steroids. Plts 30s - monitor - IV hydration - protonix 40 qd ppx - will give IV vitamin k for inr of 1.5, will follow inr daily  # hyponatremia: Likely potomania secondary to alcohol abuse Close monitoring, IV fluid. Na 130 this morning Goal to correct 8-12 M EQ per 24 hours  #Elevated ammonia,?  Hepatic encephalopathy: Likely chronic, 96 on 06/14/1999 - daily lactulose started monitor stooling  #elevated lipase: Etiology unclear, concern for acute pancreatitis We will keep patient n.p.o. No abdominal pain currently. - Trending lipase - CT abdomen   #AGMA: Etiology unclear, most likely secondary lactic acid secondary to seizure. Improving w/ fluid resuscitation - continue fluids  #Hypothyroidism: TSH elevated - resume home levothyroxine  # Hypomagenesemia # Hypophosphatemia # Hypokalemia Today k 2.7, phos 1, mg 1.7 - 20 kcl w/ fluids and additional 50 meq IV - sodium phos ordered - mg 2 g iv ordered3  # Anemia  H 7.4, no signs bleeding - monitor   DVT prophylaxis: scds (thrombocytopenia) Code Status: full Family Communication: none @ bedside  Level of care: Stepdown Status is: Inpatient  Remains inpatient appropriate because:Inpatient level of care appropriate due to severity of illness   Dispo: The  patient is from: Home              Anticipated d/c is to: tbd              Patient currently is not medically stable to d/c.   Difficult to place patient No        Consultants:  none  Procedures: none  Antimicrobials:  none    Subjective: This morning asleep in bed. Rouses but doesn't open eyes. Aware is in hospital, knows name. Denies pain.   Objective: Vitals:   01/12/21 0330 01/12/21 0530 01/12/21 0630 01/12/21 0700  BP: 100/66 100/62 93/76 (!) 89/62  Pulse: (!) 119 (!) 115  (!) 125  Resp: 14  15 14   Temp:      TempSrc:      SpO2: 100%  100% 100%  Weight:      Height:        Intake/Output Summary (Last 24 hours) at 01/12/2021 0730 Last data filed at 01/12/2021 0559 Gross per 24 hour  Intake 100 ml  Output 250 ml  Net -150 ml   Filed Weights   01/11/21 1634  Weight: 63 kg    Examination:  General: chronically ill appearing, dissheveled HEENT:atraumatic, mmm Heme: No neck lymph node enlargement. Cardiac: S1/S2, RRR, No murmurs, No gallops or rubs. Respiratory: Good air movement bilaterally. No rales, wheezing, rhonchi or rubs. GI: Soft, nondistended, nontender, no rebound pain, no organomegaly, BS present. GU: No hematuria Ext: No pitting leg edema bilaterally. 2+DP/PT pulse bilaterally. Musculoskeletal: No joint deformities, No joint redness or warmth, no limitation of ROM in spin. Skin: No rashes.  Neuro: Lethargic,  oriented X2, freely move 4 extremities, no focal neurodeficit   Data Reviewed: I have personally reviewed following labs and imaging studies  CBC: Recent Labs  Lab 01/11/21 1633 01/12/21 0321  WBC 9.8 8.2  NEUTROABS 7.9*  --   HGB 9.4* 7.4*  HCT 26.4* 20.6*  MCV 106.0* 104.0*  PLT 37* 36*   Basic Metabolic Panel: Recent Labs  Lab 01/11/21 1633 01/12/21 0321  NA 128* 130*  K 4.4 2.7*  CL 79* 88*  CO2 12* 27  GLUCOSE 115* 242*  BUN 9 8  CREATININE 0.91 0.68  CALCIUM 8.1* 7.0*  MG 1.2* 1.7  PHOS 1.2* 1.0*    GFR: Estimated Creatinine Clearance: 113.8 mL/min (by C-G formula based on SCr of 0.68 mg/dL). Liver Function Tests: Recent Labs  Lab 01/11/21 1633 01/12/21 0321  AST 412* PENDING  ALT 103* 87*  ALKPHOS 359* 258*  BILITOT 12.2* 11.0*  PROT 7.6 6.2*  ALBUMIN 3.9 3.1*   Recent Labs  Lab 01/11/21 1633 01/12/21 0321  LIPASE 1,110*  --   AMYLASE  --  256*   Recent Labs  Lab 01/11/21 1633 01/12/21 0321  AMMONIA 97* 37*   Coagulation Profile: Recent Labs  Lab 01/11/21 1633  INR 1.5*   Cardiac Enzymes: No results for input(s): CKTOTAL, CKMB, CKMBINDEX, TROPONINI in the last 168 hours. BNP (last 3 results) No results for input(s): PROBNP in the last 8760 hours. HbA1C: No results for input(s): HGBA1C in the last 72 hours. CBG: No results for input(s): GLUCAP in the last 168 hours. Lipid Profile: No results for input(s): CHOL, HDL, LDLCALC, TRIG, CHOLHDL, LDLDIRECT in the last 72 hours. Thyroid Function Tests: Recent Labs    01/11/21 1633  TSH 21.907*   Anemia  Panel: No results for input(s): VITAMINB12, FOLATE, FERRITIN, TIBC, IRON, RETICCTPCT in the last 72 hours. Urine analysis:    Component Value Date/Time   COLORURINE AMBER (A) 02/16/2020 0446   APPEARANCEUR HAZY (A) 02/16/2020 0446   LABSPEC 1.011 02/16/2020 0446   PHURINE 7.0 02/16/2020 0446   GLUCOSEU NEGATIVE 02/16/2020 0446   HGBUR NEGATIVE 02/16/2020 0446   BILIRUBINUR SMALL (A) 02/16/2020 0446   KETONESUR NEGATIVE 02/16/2020 0446   PROTEINUR 100 (A) 02/16/2020 0446   NITRITE NEGATIVE 02/16/2020 0446   LEUKOCYTESUR NEGATIVE 02/16/2020 0446   Sepsis Labs: @LABRCNTIP (procalcitonin:4,lacticidven:4)  ) Recent Results (from the past 240 hour(s))  Resp Panel by RT-PCR (Flu A&B, Covid) Nasopharyngeal Swab     Status: None   Collection Time: 01/11/21  4:33 PM   Specimen: Nasopharyngeal Swab; Nasopharyngeal(NP) swabs in vial transport medium  Result Value Ref Range Status   SARS Coronavirus 2 by RT  PCR NEGATIVE NEGATIVE Final    Comment: (NOTE) SARS-CoV-2 target nucleic acids are NOT DETECTED.  The SARS-CoV-2 RNA is generally detectable in upper respiratory specimens during the acute phase of infection. The lowest concentration of SARS-CoV-2 viral copies this assay can detect is 138 copies/mL. A negative result does not preclude SARS-Cov-2 infection and should not be used as the sole basis for treatment or other patient management decisions. A negative result may occur with  improper specimen collection/handling, submission of specimen other than nasopharyngeal swab, presence of viral mutation(s) within the areas targeted by this assay, and inadequate number of viral copies(<138 copies/mL). A negative result must be combined with clinical observations, patient history, and epidemiological information. The expected result is Negative.  Fact Sheet for Patients:  01/13/21  Fact Sheet for Healthcare Providers:  BloggerCourse.com  This test is no t yet approved or cleared by the [employer] FDA and  has been authorized for detection and/or diagnosis of SARS-CoV-2 by FDA under an Emergency Use Authorization (EUA). This EUA will remain  in effect (meaning this test can be used) for the duration of the COVID-19 declaration under Section 564(b)(1) of the Act, 21 U.S.C.section 360bbb-3(b)(1), unless the authorization is terminated  or revoked sooner.       Influenza A by PCR NEGATIVE NEGATIVE Final   Influenza B by PCR NEGATIVE NEGATIVE Final    Comment: (NOTE) The Xpert Xpress SARS-CoV-2/FLU/RSV plus assay is intended as an aid in the diagnosis of influenza from Nasopharyngeal swab specimens and should not be used as a sole basis for treatment. Nasal washings and aspirates are unacceptable for Xpert Xpress SARS-CoV-2/FLU/RSV testing.  Fact Sheet for Patients: [place]  Fact Sheet for  Healthcare Providers: BloggerCourse.com  This test is not yet approved or cleared by the [employer] FDA and has been authorized for detection and/or diagnosis of SARS-CoV-2 by FDA under an Emergency Use Authorization (EUA). This EUA will remain in effect (meaning this test can be used) for the duration of the COVID-19 declaration under Section 564(b)(1) of the Act, 21 U.S.C. section 360bbb-3(b)(1), unless the authorization is terminated or revoked.  Performed at [facility], [address], [place], [place]          Radiology Studies: CT Head Wo Contrast  Result Date: 01/11/2021 CLINICAL DATA:  Mental status change, unknown cause. Additional history provided: Reported seizure. EXAM: CT HEAD WITHOUT CONTRAST TECHNIQUE: Contiguous axial images were obtained from the base of the skull through the vertex without intravenous contrast. COMPARISON:  Prior head CT examinations 06/13/2020 and earlier. FINDINGS: Brain: Redemonstrated  age advanced cerebral and cerebellar atrophy. There is no acute intracranial hemorrhage. No demarcated cortical infarct. No extra-axial fluid collection. No evidence of intracranial mass. No midline shift. Vascular: No hyperdense vessel. Skull: Normal. Negative for fracture or focal lesion. Sinuses/Orbits: Visualized orbits show no acute finding. Small volume fluid within an anterior right ethmoid air cell. IMPRESSION: No evidence of acute intracranial abnormality. Age advanced cerebral and cerebellar atrophy. Mild right ethmoid sinusitis. Electronically Signed   By: Jackey Loge DO   On: 01/11/2021 18:59        Scheduled Meds: . folic acid  1 mg Oral Daily  . levothyroxine  100 mcg Oral Q0600  . LORazepam  0-4 mg Intravenous Q6H   Or  . LORazepam  0-4 mg Oral Q6H  . [START ON 01/14/2021] LORazepam  0-4 mg Intravenous Q12H   Or  . [START ON 01/14/2021] LORazepam  0-4 mg Oral Q12H  . magnesium oxide  400 mg Oral  BID  . multivitamin with minerals  1 tablet Oral Daily  . thiamine  100 mg Oral Daily   Or  . thiamine  100 mg Intravenous Daily   Continuous Infusions: . dextrose 5 % and 0.9 % NaCl with KCl 20 mEq/L 100 mL/hr at 01/12/21 0547  . magnesium sulfate bolus IVPB    . potassium chloride    . sodium phosphate  Dextrose 5% IVPB 30 mmol (01/12/21 0300)     LOS: 1 day    Time spent: 45 min    Silvano Bilis, MD [facility]   If 7PM-7AM, please contact night-coverage [email or web address] Password [email or web address] 01/12/2021, 7:30 AM

## 2021-01-12 NOTE — ED Notes (Signed)
Informed RN bed assigned 

## 2021-01-12 NOTE — ED Notes (Signed)
Critical Result Called by Lab: K+- 2.7 Phosphorus- 1  Admit MD made aware

## 2021-01-12 NOTE — ED Notes (Signed)
Pt requesting to have BM. Pt helped to bedside commode but to weak. Pt defecating on self during transition back into bed. Pts linen changed and pt cleaned at this time. Flexiseal in place for continued loose stool.

## 2021-01-12 NOTE — ED Notes (Signed)
Joshua Medina- [phone]

## 2021-01-13 LAB — PROTIME-INR
INR: 1.5 — ABNORMAL HIGH (ref 0.8–1.2)
Prothrombin Time: 17.9 seconds — ABNORMAL HIGH (ref 11.4–15.2)

## 2021-01-13 LAB — CBC
HCT: 21 % — ABNORMAL LOW (ref 39.0–52.0)
Hemoglobin: 7.4 g/dL — ABNORMAL LOW (ref 13.0–17.0)
MCH: 37.4 pg — ABNORMAL HIGH (ref 26.0–34.0)
MCHC: 35.2 g/dL (ref 30.0–36.0)
MCV: 106.1 fL — ABNORMAL HIGH (ref 80.0–100.0)
Platelets: 43 10*3/uL — ABNORMAL LOW (ref 150–400)
RBC: 1.98 MIL/uL — ABNORMAL LOW (ref 4.22–5.81)
RDW: 17.3 % — ABNORMAL HIGH (ref 11.5–15.5)
WBC: 7 10*3/uL (ref 4.0–10.5)
nRBC: 1.1 % — ABNORMAL HIGH (ref 0.0–0.2)

## 2021-01-13 LAB — PHOSPHORUS: Phosphorus: <1 mg/dL — CL (ref 2.5–4.6)

## 2021-01-13 LAB — COMPREHENSIVE METABOLIC PANEL
ALT: 99 U/L — ABNORMAL HIGH (ref 0–44)
AST: 353 U/L — ABNORMAL HIGH (ref 15–41)
Albumin: 3.1 g/dL — ABNORMAL LOW (ref 3.5–5.0)
Alkaline Phosphatase: 296 U/L — ABNORMAL HIGH (ref 38–126)
Anion gap: 10 (ref 5–15)
BUN: 5 mg/dL — ABNORMAL LOW (ref 6–20)
CO2: 27 mmol/L (ref 22–32)
Calcium: 6.9 mg/dL — ABNORMAL LOW (ref 8.9–10.3)
Chloride: 98 mmol/L (ref 98–111)
Creatinine, Ser: 0.47 mg/dL — ABNORMAL LOW (ref 0.61–1.24)
GFR, Estimated: >60 mL/min (ref >60)
Glucose, Bld: 121 mg/dL — ABNORMAL HIGH (ref 70–99)
Potassium: 3.1 mmol/L — ABNORMAL LOW (ref 3.5–5.1)
Sodium: 135 mmol/L (ref 135–145)
Total Bilirubin: 11 mg/dL — ABNORMAL HIGH (ref 0.3–1.2)
Total Protein: 5.9 g/dL — ABNORMAL LOW (ref 6.5–8.1)

## 2021-01-13 LAB — MAGNESIUM: Magnesium: 1.7 mg/dL (ref 1.7–2.4)

## 2021-01-13 MED ORDER — ENSURE ENLIVE PO LIQD
237.0000 mL | Freq: Two times a day (BID) | ORAL | Status: DC
  Administered 2021-01-14 – 2021-01-17 (×7): 237 mL via ORAL

## 2021-01-13 MED ORDER — POTASSIUM CHLORIDE CRYS ER 20 MEQ PO TBCR
40.0000 meq | EXTENDED_RELEASE_TABLET | Freq: Two times a day (BID) | ORAL | Status: DC
  Administered 2021-01-13 – 2021-01-14 (×3): 40 meq via ORAL
  Filled 2021-01-13 (×4): qty 2

## 2021-01-13 MED ORDER — MAGNESIUM SULFATE 2 GM/50ML IV SOLN
2.0000 g | Freq: Once | INTRAVENOUS | Status: AC
  Administered 2021-01-13: 2 g via INTRAVENOUS
  Filled 2021-01-13: qty 50

## 2021-01-13 MED ORDER — MAGNESIUM SULFATE 2 GM/50ML IV SOLN: 2.0000 g | Freq: Once | INTRAVENOUS | Status: DC

## 2021-01-13 MED ORDER — POTASSIUM CHLORIDE CRYS ER 20 MEQ PO TBCR: 40.0000 meq | EXTENDED_RELEASE_TABLET | Freq: Two times a day (BID) | ORAL | Status: DC

## 2021-01-13 MED ORDER — SODIUM PHOSPHATES 45 MMOLE/15ML IV SOLN
30.0000 mmol | Freq: Once | INTRAVENOUS | Status: AC
  Administered 2021-01-13: 30 mmol via INTRAVENOUS
  Filled 2021-01-13: qty 10

## 2021-01-13 MED ORDER — POTASSIUM CHLORIDE 20 MEQ PO PACK
40.0000 meq | PACK | Freq: Once | ORAL | Status: AC
  Administered 2021-01-13: 40 meq via ORAL
  Filled 2021-01-13: qty 2

## 2021-01-13 NOTE — Evaluation (Signed)
Clinical/Bedside Swallow Evaluation Patient Details  Name: Joshua Medina MRN: [id number] Date of Birth: 1986/02/13  Today's Date: 01/13/2021 Time: SLP Start Time (ACUTE ONLY): 1310 SLP Stop Time (ACUTE ONLY): 1410 SLP Time Calculation (min) (ACUTE ONLY): 60 min  Past Medical History:  Past Medical History:  Diagnosis Date  . Graves disease   . Thyroid disease    Past Surgical History:  Past Surgical History:  Procedure Laterality Date  . thyroid ablation with radioactive I-131     HPI:  Pt is a 35 y.o. male with medical history significant of malnutrition, Graves Dis., hypothyroidism, alcohol liver disease, alcohol withdrawal seizure, delirium presented with seizure, altered mental status. In ED patient was give folic acid, thiamine,  lactulose, Ativan.  CT head: No evidence of acute intracranial abnormality. Age advanced cerebral and cerebellar atrophy.  CXR: Mild left base atelectasis.  Pt was admitted w/ dx of Alcohol abuse, alcohol withdrawal seizure: Patient's seizure is most likely due to alcohol withdrawal seizure. Patient is a heavy drinker and had hx of withdrawal seizures. Last drink 2 days ago.   Assessment / Plan / Recommendation Clinical Impression  Pt appears to present w/ grossly adequate oropharyngeal phase swallow w/ No pharyngeal phase dysphagia noted, No neuromuscular deficits noted -- just slower oral motor management suspect related to pt's mental status post seizure and medications for seizure control. Pt consumed po trials w/ No overt, clinical s/s of aspiration during po trials. Pt appears at reduced risk for aspiration following general aspiration precautions and supported w/ feeding at meals d/t overall weakness. During po trials, pt consumed all consistencies of trials fed to him and consumed via drinking by cup/straw w/ no overt coughing, decline in vocal quality, or change in respiratory presentation during/post trials. Oral phase appeared min slower in bolus  management w/ all trials but grossly [facility] w/ trials of liquids and purees when he awakened more. Adequate bolus management, mastication, and control of bolus propulsion for A-P transfer for swallowing occurred; min more Time given/needed for mastication of solids secondary to texture. Oral clearing (via oral checking b/t bites) achieved w/ all trial consistencies given Time. Again, suspect slowness/deliberateness of the oral phase is impacted by drowsiness from recent seizures, medication for such. OM Exam appeared [facility] w/ no overt unilateral weakness noted. Speech Clear, mumbled. Pt fed self Holding Cup when drinking; support w/ feeding of solids given. Verbal cues given intermittently for follow through w/ tasks.   Recommend a Regular consistency diet for ease of food choices w/ well-Cut meats, moistened foods; Thin liquids. Recommend general aspiration precautions, Pills WHOLE in Puree for safer, easier swallowing IF needed per NSG. Supervision at meals. Education given on Pills in Puree; food consistencies and easy to eat options; general aspiration precautions to pt/NSG. NSG to reconsult if any new needs arise. NSG agreed. SLP Visit Diagnosis: Dysphagia, unspecified (R13.10) (seizures, meds)    Aspiration Risk   (reduced following precautions)    Diet Recommendation   Regular consistency diet for ease of food choices w/ well-Cut meats, moistened foods; Thin liquids. Recommend general aspiration precautions, Supervision at meals w/ support feeding.   Medication Administration: Whole meds with puree (as needed for safer swallowing per NSG)    Other  Recommendations Recommended Consults:  (Dietician f/u) Oral Care Recommendations: Oral care BID;Oral care before and after PO;Staff/trained caregiver to provide oral care Other Recommendations:  (n/a)   Follow up Recommendations None      Frequency and Duration  (n/a)   (n/a)  Prognosis Prognosis for Safe Diet Advancement: Fair  (-Good) Barriers to Reach Goals: Time post onset;Severity of deficits (Mental Status currently)      Swallow Study   General Date of Onset: 01/11/21 HPI: Pt is a 35 y.o. male with medical history significant of malnutrition, Graves Dis., hypothyroidism, alcohol liver disease, alcohol withdrawal seizure, delirium presented with seizure, altered mental status. In ED patient was give folic acid, thiamine,  lactulose, Ativan.  CT head: No evidence of acute intracranial abnormality. Age advanced cerebral and cerebellar atrophy.  CXR: Mild left base atelectasis.  Pt was admitted w/ dx of Alcohol abuse, alcohol withdrawal seizure: Patient's seizure is most likely due to alcohol withdrawal seizure. Patient is a heavy drinker and had hx of withdrawal seizures. Last drink 2 days ago. Type of Study: Bedside Swallow Evaluation Previous Swallow Assessment: none Diet Prior to this Study: Thin liquids (clear liquid, then mech soft per md) Temperature Spikes Noted: No (wbc 7.0) Respiratory Status: Room air History of Recent Intubation: No Behavior/Cognition: Alert;Cooperative;Pleasant mood;Confused;Distractible;Requires cueing;Lethargic/Drowsy Oral Cavity Assessment: Dry Oral Care Completed by SLP: Yes Oral Cavity - Dentition: Adequate natural dentition Vision: Functional for self-feeding Self-Feeding Abilities: Able to feed self;Needs assist;Needs set up;Total assist (drowsy, weak) Patient Positioning: Upright in bed (needed positioning) Baseline Vocal Quality: Normal;Low vocal intensity Volitional Cough: Strong Volitional Swallow: Unable to elicit    Oral/Motor/Sensory Function Overall Oral Motor/Sensory Function: Within functional limits (cursory w/ some responses)   Ice Chips Ice chips: Within functional limits Presentation: Spoon (fed; 3 trials)   Thin Liquid Thin Liquid: Within functional limits Presentation: Self Fed;Straw;Cup (most via straw - ~4 ozs total) Other Comments: pt held cup to drink     Nectar Thick Nectar Thick Liquid: Not tested   Honey Thick Honey Thick Liquid: Not tested   Puree Puree: Within functional limits (grossly) Presentation: Spoon (fed; 10 trials)   Solid     Solid: Impaired Presentation: Spoon (fed; 8-9 trials) Oral Phase Impairments:  (reduced attention to task; slow responses) Oral Phase Functional Implications: Prolonged oral transit Pharyngeal Phase Impairments:  (none)        Jerilynn Som, MS, [facility] [phone] Joshua Medina 01/13/2021,4:56 PM

## 2021-01-13 NOTE — Consult Note (Signed)
PHARMACY CONSULT NOTE - FOLLOW UP  Pharmacy Consult for Electrolyte Monitoring and Replacement   Recent Labs: Potassium (mmol/L)  Date Value  01/13/2021 3.1 (L)   Magnesium (mg/dL)  Date Value  84/69/6295 1.7   Calcium (mg/dL)  Date Value  28/41/3244 6.9 (L)   Albumin (g/dL)  Date Value  08/23/7251 3.1 (L)   Phosphorus (mg/dL)  Date Value  66/44/0347 <1.0 (LL)   Sodium (mmol/L)  Date Value  01/13/2021 135     Assessment: 35 y.o.malewith medical history significant ofmalnutrition, hypothyroidism, alcohol liver disease, alcohol withdrawal seizure, delirium presented with seizure, altered mental status  Goal of Therapy:  WNL  Plan:  Continue NS with KCl 40 mEq @ 100 ml/hr and KCl 40 mEq BID. Medical team orderd KPhos IV 30 mmol x1, and will order Mg 2 g IV x 1.  F/u with AM labs.   Ronnald Ramp ,PharmD Clinical Pharmacist 01/13/2021 11:33 AM

## 2021-01-13 NOTE — Progress Notes (Signed)
PT Cancellation Note  Patient Details Name: Joshua Medina MRN: [id number] DOB: 1986-08-20   Cancelled Treatment:    Reason Eval/Treat Not Completed: Medical issues which prohibited therapy Chart reviewed, spoke with nurse who requests to hold off on PT exam for the day.  BP has been low, pt tremoring, etc.  Will maintain on caseload and hope to be able to initiate PT tomorrow.   Malachi Pro, DPT 01/13/2021, 3:05 PM

## 2021-01-13 NOTE — Progress Notes (Signed)
Initial Nutrition Assessment  DOCUMENTATION CODES:  Non-severe (moderate) malnutrition in context of social or environmental circumstances  INTERVENTION:   Advance diet to regular as medically able  Ensure Enlive po BID, each supplement provides 350 kcal and 20 grams of protein  Continue MVI with minerals, thiamine, and folic acid daily   NUTRITION DIAGNOSIS:  Moderate Malnutrition (in the context of social/environmental circumstances) related to poor appetite as evidenced by mild muscle depletion,mild fat depletion.  GOAL:  Patient will meet greater than or equal to 90% of their needs  MONITOR:  PO intake,Supplement acceptance,Labs,Weight trends  REASON FOR ASSESSMENT:  Consult Assessment of nutrition requirement/status,Diet education  ASSESSMENT:  Pt presented to ED from home with concerns for a seizure and with AMS. Family reports that pt drinks daily, no EtOH x 2 days. PMH includes graves disease, EtOH abuse with hx of withdrawal seizure.  Pt laying in bed at the time of visit. Did not open eyes during assessment, and would only mumble words or short phrases to nutrition inquiries. Thin on inspection with visible muscle and fat deficits. Diet was advanced this afternoon from clear liquids. Will add nutrition supplements to augment intake.   Noted significant abnormalities in pt's electrolytes. Being follwed by pharmacy for electrolyte replacement daily.    Average Meal Intake: . 5/24-5/26: 0% intake x 1 recorded meal  Nutritionally Relevant Medications Scheduled Meds: . folic acid  1 mg Oral Daily  . lactulose  20 g Oral Daily  . magnesium oxide  400 mg Oral BID  . multivitamin with minerals  1 tablet Oral Daily  . pantoprazole  40 mg Oral Daily  . potassium chloride  40 mEq Oral Once  . thiamine  100 mg Oral Daily   Continuous Infusions: . 0.9 % NaCl with KCl 40 mEq / L 100 mL/hr at 01/13/21 0419  . sodium phosphate  Dextrose 5% IVPB 30 mmol (01/13/21 0657)    Labs reviewed:   K 3.1  BUN 5 (L), creatinine .47   Calcium corrects to 7.62   Phosphorus <1   Alk phos 296  AST 353  ALT 99  5/25 - Amylase 256, lipase 650  5/25 - Ammonia 37  NUTRITION - FOCUSED PHYSICAL EXAM: Flowsheet Row Most Recent Value  Orbital Region No depletion  Upper Arm Region Mild depletion  Thoracic and Lumbar Region No depletion  Buccal Region No depletion  Temple Region No depletion  Clavicle Bone Region No depletion  Clavicle and Acromion Bone Region Mild depletion  Scapular Bone Region No depletion  Dorsal Hand No depletion  Patellar Region Mild depletion  Anterior Thigh Region Mild depletion  Posterior Calf Region No depletion  Edema (RD Assessment) None  Hair Reviewed  Eyes Unable to assess  [pt would not open eyes]  Mouth Reviewed  Skin Reviewed  [jaundice]  Nails Reviewed  [long, unkempt]     Diet Order:   Diet Order            DIET DYS 3 Room service appropriate? Yes; Fluid consistency: Thin  Diet effective now                 EDUCATION NEEDS:  No education needs have been identified at this time  Skin:  Skin Assessment: Skin Integrity Issues: Skin Integrity Issues:: Other (Comment) Other: bruising to the legs  Last BM:  5/26 - per RN documentation  Height:  Ht Readings from Last 1 Encounters:  01/11/21 '5\' 8"'  (1.727 m)   Weight:  Wt  Readings from Last 1 Encounters:  01/11/21 63 kg    Ideal Body Weight:  70 kg  BMI:  Body mass index is 21.12 kg/m.  Estimated Nutritional Needs:   Kcal:  2100-2300 kcal/d  Protein:  105-120 g/d  Fluid:  >2L/d   Ranell Patrick, RD, LDN Clinical Dietitian Pager on [place]

## 2021-01-13 NOTE — Progress Notes (Addendum)
PROGRESS NOTE    Joshua Medina  NFA:[id number] DOB: 02-07-1986 DOA: 01/11/2021 PCP: Pcp, No  Outpatient Specialists: none    Brief Narrative:    Joshua Medina is a 35 y.o. male with medical history significant of malnutrition, hypothyroidism, alcohol liver disease, alcohol withdrawal seizure, delirium presented with seizure, altered mental status  When I saw patient, patient is lethargic, not good historian, oriented to time and people, he thought he is in [place], he could not recall seizure episode, does report last drinking 2 days ago, reported to have some loose stool yesterday, otherwise patient reports no  fever, chills, chest pain, shortness of breath, cough, abdominal pain,  dysuria, leg swelling.  Per Chart review, EMS was reported from family that patient may had a seizure, EMS found the patient has been shaking/tremors,  patient was sent to the ED   ED Course: pt was found to have   temperature 97.6, heart rate 113-122, respiratory 16, blood pressure 101/82, oxygen saturation 100% on room air.  Labs show sodium sodium 128, chloride 79, bicarb 12 glucose 115, iron 77, lipase 1100 AST 412,  ALT 103, ammonia 97, total bilirubin 12.2, hemoglobin 9.4, platelets 37, INR 1.5, acetaminophen level less than 10, influenza negative/negative, alcohol level less than 10 CT head: No evidence of acute intracranial abnormality. Age advanced cerebral and cerebellar atrophy. Mild right ethmoid sinusitis.  In ED patient was give folic acid, thiamine,  lactulose, Ativan Hospitalist was requested to admit patient   Assessment & Plan:   Active Problems:   Hypothyroidism (acquired)   Alcoholic liver disease (HCC)   Alcohol withdrawal seizure (HCC)   Altered mental status   Hyponatremia   # Acute encephalopathy  Likely multifactorial, postictal status alcohol withdrawal seizure versus? Hepatic encephalopathy. Improving - close monitoring  #Alcohol abuse, alcohol withdrawal seizure:  Patient's seizure is most likely due to alcohol withdrawal seizure.  Patient is a heavy drinker and had hx of withdrawal seizures. Last drink 2 days prior to admit. Ct-head is negative for acute intracranial abnormalities. No focal neurological findings on physical examination. Required precedex, pressors in icu last hospitalization. - CIWA protocol - Thiamine & Folate - stop IVF is taking orals - Seizure precautions  #Alcoholic liver disease,?  Liver cirrhosis. Thrombocytopenia MEL D sodium score 26, 90 days mortality 14-15% Maddrey'ss discrimination function 24.2, no indication for steroids. Plts 40s and stable - monitor - IV hydration - protonix 40 qd ppx - s/p vitamin k  # hyponatremia: Likely potomania secondary to alcohol abuse Improving with fluids  #Elevated ammonia,?  Hepatic encephalopathy: Likely chronic, 96 on 06/14/1999 - daily lactulose started monitor stooling  #elevated lipase:  CT neg, asymptomatic, likely 2/2 liver disease - monitor  #AGMA: Etiology unclear, most likely secondary lactic acid secondary to seizure. Improving w/ fluid resuscitation  #Hypothyroidism: TSH elevated - resume home levothyroxine  # Hypomagenesemia # Hypophosphatemia # Hypokalemia Today k 3.1, phos 1, mg 1.7 - 40 kcl bid - sodium phos ordered - mg 2 g iv ordered  # Anemia  H 7.4, no signs bleeding, stable - monitor   DVT prophylaxis: scds (thrombocytopenia) Code Status: full Family Communication: attempted to contact wife telephonically 5/26, call won't go through. Confirmed phone number w/ patient  Level of care: Progressive Cardiac Status is: Inpatient  Remains inpatient appropriate because:Inpatient level of care appropriate due to severity of illness   Dispo: The patient is from: Home              Anticipated d/c is  to: tbd              Patient currently is not medically stable to d/c.   Difficult to place patient No        Consultants:   none  Procedures: none  Antimicrobials:  none    Subjective: This morning awake. Speech slurred, no pain, no n/v/d. Tolerating clears. Slightly tremulous  Objective: Vitals:   01/13/21 0409 01/13/21 0410 01/13/21 0745 01/13/21 0828  BP: 90/62  (!) 86/63 (!) 89/65  Pulse: (!) 111 (!) 109 (!) 107 (!) 106  Resp: 19  18   Temp: 98.6 F (37 C)  98.3 F (36.8 C)   TempSrc:   Oral   SpO2: 100% 100% 100%   Weight:      Height:        Intake/Output Summary (Last 24 hours) at 01/13/2021 1138 Last data filed at 01/13/2021 0940 Gross per 24 hour  Intake 200 ml  Output 850 ml  Net -650 ml   Filed Weights   01/11/21 1634  Weight: 63 kg    Examination:  General: chronically ill appearing, dissheveled HEENT:atraumatic, mmm Heme: No neck lymph node enlargement. Cardiac: S1/S2, RRR, No murmurs, No gallops or rubs. Respiratory: Good air movement bilaterally. No rales, wheezing, rhonchi or rubs. GI: Soft, nondistended, nontender, no rebound pain, no organomegaly, BS present. GU: No hematuria Ext: No pitting leg edema bilaterally. 2+DP/PT pulse bilaterally. Musculoskeletal: No joint deformities, No joint redness or warmth, no limitation of ROM in spin. Skin: No rashes. jaundiced Neuro: Lethargic,  oriented X2, freely move 4 extremities, no focal neurodeficit   Data Reviewed: I have personally reviewed following labs and imaging studies  CBC: Recent Labs  Lab 01/11/21 1633 01/12/21 0321 01/13/21 0445  WBC 9.8 8.2 7.0  NEUTROABS 7.9*  --   --   HGB 9.4* 7.4* 7.4*  HCT 26.4* 20.6* 21.0*  MCV 106.0* 104.0* 106.1*  PLT 37* 36* 43*   Basic Metabolic Panel: Recent Labs  Lab 01/11/21 1633 01/12/21 0321 01/12/21 1446 01/13/21 0445  NA 128* 130* 133* 135  K 4.4 2.7* 2.9* 3.1*  CL 79* 88* 93* 98  CO2 12* 27 26 27   GLUCOSE 115* 242* 166* 121*  BUN 9 8 7  5*  CREATININE 0.91 0.68 0.47* 0.47*  CALCIUM 8.1* 7.0* 7.0* 6.9*  MG 1.2* 1.7  --  1.7  PHOS 1.2* 1.0*  --   <1.0*   GFR: Estimated Creatinine Clearance: 113.8 mL/min (A) (by C-G formula based on SCr of 0.47 mg/dL (L)). Liver Function Tests: Recent Labs  Lab 01/11/21 1633 01/12/21 0321 01/12/21 1446 01/13/21 0445  AST 412* 324* 324* 353*  ALT 103* 87* 90* 99*  ALKPHOS 359* 258* 293* 296*  BILITOT 12.2* 11.0* 10.8* 11.0*  PROT 7.6 6.2* 5.9* 5.9*  ALBUMIN 3.9 3.1* 3.0* 3.1*   Recent Labs  Lab 01/11/21 1633 01/12/21 0321  LIPASE 1,110* 650*  AMYLASE  --  256*   Recent Labs  Lab 01/11/21 1633 01/12/21 0321  AMMONIA 97* 37*   Coagulation Profile: Recent Labs  Lab 01/11/21 1633 01/13/21 0445  INR 1.5* 1.5*   Cardiac Enzymes: No results for input(s): CKTOTAL, CKMB, CKMBINDEX, TROPONINI in the last 168 hours. BNP (last 3 results) No results for input(s): PROBNP in the last 8760 hours. HbA1C: No results for input(s): HGBA1C in the last 72 hours. CBG: No results for input(s): GLUCAP in the last 168 hours. Lipid Profile: No results for input(s): CHOL, HDL, LDLCALC, TRIG, CHOLHDL,  LDLDIRECT in the last 72 hours. Thyroid Function Tests: Recent Labs    01/11/21 1633  TSH 21.907*   Anemia Panel: No results for input(s): VITAMINB12, FOLATE, FERRITIN, TIBC, IRON, RETICCTPCT in the last 72 hours. Urine analysis:    Component Value Date/Time   COLORURINE AMBER (A) 02/16/2020 0446   APPEARANCEUR HAZY (A) 02/16/2020 0446   LABSPEC 1.011 02/16/2020 0446   PHURINE 7.0 02/16/2020 0446   GLUCOSEU NEGATIVE 02/16/2020 0446   HGBUR NEGATIVE 02/16/2020 0446   BILIRUBINUR SMALL (A) 02/16/2020 0446   KETONESUR NEGATIVE 02/16/2020 0446   PROTEINUR 100 (A) 02/16/2020 0446   NITRITE NEGATIVE 02/16/2020 0446   LEUKOCYTESUR NEGATIVE 02/16/2020 0446   Sepsis Labs: @LABRCNTIP (procalcitonin:4,lacticidven:4)  ) Recent Results (from the past 240 hour(s))  Resp Panel by RT-PCR (Flu A&B, Covid) Nasopharyngeal Swab     Status: None   Collection Time: 01/11/21  4:33 PM   Specimen:  Nasopharyngeal Swab; Nasopharyngeal(NP) swabs in vial transport medium  Result Value Ref Range Status   SARS Coronavirus 2 by RT PCR NEGATIVE NEGATIVE Final    Comment: (NOTE) SARS-CoV-2 target nucleic acids are NOT DETECTED.  The SARS-CoV-2 RNA is generally detectable in upper respiratory specimens during the acute phase of infection. The lowest concentration of SARS-CoV-2 viral copies this assay can detect is 138 copies/mL. A negative result does not preclude SARS-Cov-2 infection and should not be used as the sole basis for treatment or other patient management decisions. A negative result may occur with  improper specimen collection/handling, submission of specimen other than nasopharyngeal swab, presence of viral mutation(s) within the areas targeted by this assay, and inadequate number of viral copies(<138 copies/mL). A negative result must be combined with clinical observations, patient history, and epidemiological information. The expected result is Negative.  Fact Sheet for Patients:  01/13/21  Fact Sheet for Healthcare Providers:  BloggerCourse.com  This test is no t yet approved or cleared by the [employer] FDA and  has been authorized for detection and/or diagnosis of SARS-CoV-2 by FDA under an Emergency Use Authorization (EUA). This EUA will remain  in effect (meaning this test can be used) for the duration of the COVID-19 declaration under Section 564(b)(1) of the Act, 21 U.S.C.section 360bbb-3(b)(1), unless the authorization is terminated  or revoked sooner.       Influenza A by PCR NEGATIVE NEGATIVE Final   Influenza B by PCR NEGATIVE NEGATIVE Final    Comment: (NOTE) The Xpert Xpress SARS-CoV-2/FLU/RSV plus assay is intended as an aid in the diagnosis of influenza from Nasopharyngeal swab specimens and should not be used as a sole basis for treatment. Nasal washings and aspirates are unacceptable for  Xpert Xpress SARS-CoV-2/FLU/RSV testing.  Fact Sheet for Patients: [place]  Fact Sheet for Healthcare Providers: BloggerCourse.com  This test is not yet approved or cleared by the [employer] FDA and has been authorized for detection and/or diagnosis of SARS-CoV-2 by FDA under an Emergency Use Authorization (EUA). This EUA will remain in effect (meaning this test can be used) for the duration of the COVID-19 declaration under Section 564(b)(1) of the Act, 21 U.S.C. section 360bbb-3(b)(1), unless the authorization is terminated or revoked.  Performed at [facility], [address], [place], [place]          Radiology Studies: CT ABDOMEN WO CONTRAST  Result Date: 01/12/2021 CLINICAL DATA:  Elevated lipase EXAM: CT ABDOMEN WITHOUT CONTRAST TECHNIQUE: Multidetector CT imaging of the abdomen was performed following the standard protocol without IV contrast.  COMPARISON:  06/05/2020 FINDINGS: Lower chest: Lung bases are free of acute infiltrate or sizable effusion. Hepatobiliary: Diffuse fatty infiltration of the liver is noted. The gallbladder is well distended. Pancreas: Unremarkable. No pancreatic ductal dilatation or surrounding inflammatory changes. Spleen: Normal in size without focal abnormality. Adrenals/Urinary Tract: Adrenal glands are within normal limits bilaterally. Scattered nonobstructing renal calculi are seen bilaterally. These measure less than 3 mm. Ureters as visualized are within normal limits. Stomach/Bowel: Visualized bowel shows no obstructive change. Vascular/Lymphatic: Aortic atherosclerosis. No enlarged abdominal lymph nodes. Other: No abdominal wall hernia or abnormality. Trace peritoneal fluid is again identified in the pericolic gutters bilaterally. Musculoskeletal: No acute or significant osseous findings. IMPRESSION: Pancreas appears within normal limits. Fatty liver. Bilateral small  nonobstructing renal calculi. Trace peritoneal fluid which appears stable from the previous exam. Electronically Signed   By: Alcide Clever M.D.   On: 01/12/2021 08:46   CT Head Wo Contrast  Result Date: 01/11/2021 CLINICAL DATA:  Mental status change, unknown cause. Additional history provided: Reported seizure. EXAM: CT HEAD WITHOUT CONTRAST TECHNIQUE: Contiguous axial images were obtained from the base of the skull through the vertex without intravenous contrast. COMPARISON:  Prior head CT examinations 06/13/2020 and earlier. FINDINGS: Brain: Redemonstrated age advanced cerebral and cerebellar atrophy. There is no acute intracranial hemorrhage. No demarcated cortical infarct. No extra-axial fluid collection. No evidence of intracranial mass. No midline shift. Vascular: No hyperdense vessel. Skull: Normal. Negative for fracture or focal lesion. Sinuses/Orbits: Visualized orbits show no acute finding. Small volume fluid within an anterior right ethmoid air cell. IMPRESSION: No evidence of acute intracranial abnormality. Age advanced cerebral and cerebellar atrophy. Mild right ethmoid sinusitis. Electronically Signed   By: Jackey Loge DO   On: 01/11/2021 18:59   DG Chest Port 1 View  Result Date: 01/12/2021 CLINICAL DATA:  Seizures. EXAM: PORTABLE CHEST 1 VIEW COMPARISON:  06/04/2020. FINDINGS: Mediastinum and hilar structures normal. Mild left base atelectasis. No pleural effusion or pneumothorax no acute bony abnormality. IMPRESSION: Mild left base atelectasis. Electronically Signed   By: Maisie Fus  Register   On: 01/12/2021 07:55        Scheduled Meds: . folic acid  1 mg Oral Daily  . lactulose  20 g Oral Daily  . levothyroxine  100 mcg Oral Q0600  . [START ON 01/14/2021] LORazepam  0-4 mg Intravenous Q12H   Or  . [START ON 01/14/2021] LORazepam  0-4 mg Oral Q12H  . magnesium oxide  400 mg Oral BID  . multivitamin with minerals  1 tablet Oral Daily  . pantoprazole  40 mg Oral Daily  .  potassium chloride  40 mEq Oral BID  . thiamine  100 mg Oral Daily   Or  . thiamine  100 mg Intravenous Daily   Continuous Infusions: . 0.9 % NaCl with KCl 40 mEq / L 100 mL/hr at 01/13/21 0419  . magnesium sulfate bolus IVPB    . sodium phosphate  Dextrose 5% IVPB 30 mmol (01/13/21 0657)  . sodium phosphate  Dextrose 5% IVPB       LOS: 2 days    Time spent: 45 min    Silvano Bilis, MD [facility]   If 7PM-7AM, please contact night-coverage [email or web address] Password [facility] 01/13/2021, 11:38 AM

## 2021-01-14 LAB — CBC
HCT: 21.1 % — ABNORMAL LOW (ref 39.0–52.0)
Hemoglobin: 7.2 g/dL — ABNORMAL LOW (ref 13.0–17.0)
MCH: 37.5 pg — ABNORMAL HIGH (ref 26.0–34.0)
MCHC: 34.1 g/dL (ref 30.0–36.0)
MCV: 109.9 fL — ABNORMAL HIGH (ref 80.0–100.0)
Platelets: 53 10*3/uL — ABNORMAL LOW (ref 150–400)
RBC: 1.92 MIL/uL — ABNORMAL LOW (ref 4.22–5.81)
RDW: 18.1 % — ABNORMAL HIGH (ref 11.5–15.5)
WBC: 5.9 10*3/uL (ref 4.0–10.5)
nRBC: 1.9 % — ABNORMAL HIGH (ref 0.0–0.2)

## 2021-01-14 LAB — PROTIME-INR
INR: 1.3 — ABNORMAL HIGH (ref 0.8–1.2)
Prothrombin Time: 16.6 seconds — ABNORMAL HIGH (ref 11.4–15.2)

## 2021-01-14 LAB — COMPREHENSIVE METABOLIC PANEL
ALT: 105 U/L — ABNORMAL HIGH (ref 0–44)
AST: 323 U/L — ABNORMAL HIGH (ref 15–41)
Albumin: 2.9 g/dL — ABNORMAL LOW (ref 3.5–5.0)
Alkaline Phosphatase: 306 U/L — ABNORMAL HIGH (ref 38–126)
Anion gap: 9 (ref 5–15)
BUN: <5 mg/dL — ABNORMAL LOW (ref 6–20)
CO2: 24 mmol/L (ref 22–32)
Calcium: 6.9 mg/dL — ABNORMAL LOW (ref 8.9–10.3)
Chloride: 100 mmol/L (ref 98–111)
Creatinine, Ser: 0.48 mg/dL — ABNORMAL LOW (ref 0.61–1.24)
GFR, Estimated: >60 mL/min (ref >60)
Glucose, Bld: 107 mg/dL — ABNORMAL HIGH (ref 70–99)
Potassium: 3.3 mmol/L — ABNORMAL LOW (ref 3.5–5.1)
Sodium: 133 mmol/L — ABNORMAL LOW (ref 135–145)
Total Bilirubin: 10.6 mg/dL — ABNORMAL HIGH (ref 0.3–1.2)
Total Protein: 5.7 g/dL — ABNORMAL LOW (ref 6.5–8.1)

## 2021-01-14 LAB — MAGNESIUM: Magnesium: 1.8 mg/dL (ref 1.7–2.4)

## 2021-01-14 LAB — PHOSPHORUS: Phosphorus: 1.4 mg/dL — ABNORMAL LOW (ref 2.5–4.6)

## 2021-01-14 MED ORDER — THIAMINE HCL 100 MG/ML IJ SOLN
500.0000 mg | Freq: Three times a day (TID) | INTRAVENOUS | Status: DC
  Administered 2021-01-14 – 2021-01-17 (×9): 500 mg via INTRAVENOUS
  Filled 2021-01-14 (×13): qty 5

## 2021-01-14 MED ORDER — MAGNESIUM SULFATE 2 GM/50ML IV SOLN: 2.0000 g | Freq: Once | INTRAVENOUS | Status: DC

## 2021-01-14 MED ORDER — POTASSIUM PHOSPHATES 15 MMOLE/5ML IV SOLN
30.0000 mmol | Freq: Once | INTRAVENOUS | Status: AC
  Administered 2021-01-14: 30 mmol via INTRAVENOUS
  Filled 2021-01-14: qty 10

## 2021-01-14 MED ORDER — MAGNESIUM SULFATE 2 GM/50ML IV SOLN
2.0000 g | Freq: Once | INTRAVENOUS | Status: AC
  Administered 2021-01-14: 2 g via INTRAVENOUS
  Filled 2021-01-14: qty 50

## 2021-01-14 MED ORDER — POTASSIUM CHLORIDE CRYS ER 20 MEQ PO TBCR
40.0000 meq | EXTENDED_RELEASE_TABLET | Freq: Once | ORAL | Status: AC
  Administered 2021-01-14: 40 meq via ORAL
  Filled 2021-01-14: qty 2

## 2021-01-14 NOTE — Progress Notes (Signed)
   01/14/21 1147  Assess: MEWS Score  Temp 99.5 F (37.5 C)  BP (!) 88/65  Pulse Rate (!) 104  Resp 16  SpO2 100 %  O2 Device Room Air  Assess: MEWS Score  MEWS Temp 0  MEWS Systolic 1  MEWS Pulse 1  MEWS RR 0  MEWS LOC 1  MEWS Score 3  MEWS Score Color Yellow  Assess: if the MEWS score is Yellow or Red  Were vital signs taken at a resting state? Yes  Focused Assessment No change from prior assessment  Early Detection of Sepsis Score *See Row Information* Low  MEWS guidelines implemented *See Row Information* No, previously yellow, continue vital signs every 4 hours  Treat  Pain Scale 0-10  Pain Score 0

## 2021-01-14 NOTE — Evaluation (Signed)
Physical Therapy Evaluation Patient Details Name: Joshua Medina MRN: [id number] DOB: Jul 02, 1986 Today's Date: 01/14/2021   History of Present Illness  Pt is a 35 y.o. male with medical history significant of malnutrition, hypothyroidism, alcohol liver disease, alcohol withdrawal seizure, delirium presented with seizure, altered mental status.  MD assessment includes: Acute encephalopathy likely multifactorial, postictal status alcohol withdrawal seizure versus hepatic encephalopathy, alcohol abuse/withdrawal, alcoholic liver disease, liver cirrhosis, thrombocytopenia, hyponatremia, elevated ammonia, elevated lipase, hypomagnesemia, hypokalemia, and anemia.    Clinical Impression  Pt with very flat affect with eyes closed for most of the session but was able to answer questions and follow commands with extra time and cuing.  Orthostatic BP/HR as follows: supine 84/63, 109 bpm; sitting 96/72, 124 bpm; standing 92/69, 136 bpm, nursing notified.  Pt required physical assistance with all functional tasks and was only able to take 2-3 very small shuffling steps at the EOB with mod A to prevent posterior LOB. Pt would not be safe to return to his prior living situation at this time and will benefit from PT services in a SNF setting upon discharge to safely address deficits listed in patient problem list for decreased caregiver assistance and eventual return to PLOF.      Follow Up Recommendations SNF;Supervision/Assistance - 24 hour    Equipment Recommendations  None recommended by PT    Recommendations for Other Services       Precautions / Restrictions Precautions Precautions: Fall;Other (comment) Restrictions Weight Bearing Restrictions: No Other Position/Activity Restrictions: Seizure precautions      Mobility  Bed Mobility Overal bed mobility: Needs Assistance Bed Mobility: Supine to Sit     Supine to sit: Mod assist     General bed mobility comments: Mod A for BLE and trunk  control    Transfers Overall transfer level: Needs assistance Equipment used: Rolling walker (2 wheeled) Transfers: Sit to/from Stand Sit to Stand: Mod assist         General transfer comment: Mod A to prevent posterior LOB in standing with heavy posterior lean  Ambulation/Gait Ambulation/Gait assistance: Mod assist Gait Distance (Feet): 1 Feet Assistive device: Rolling walker (2 wheeled) Gait Pattern/deviations: Step-to pattern;Shuffle Gait velocity: decreased   General Gait Details: Pt able to take 2-3 very small shuffling steps at the EOB with mod A to prevent posterior LOB  Stairs            Wheelchair Mobility    Modified Rankin (Stroke Patients Only)       Balance Overall balance assessment: Needs assistance Sitting-balance support: Bilateral upper extremity supported;Feet supported Sitting balance-Leahy Scale: Fair   Postural control: Posterior lean Standing balance support: Bilateral upper extremity supported;During functional activity Standing balance-Leahy Scale: Poor                               Pertinent Vitals/Pain Pain Assessment: No/denies pain    Home Living Family/patient expects to be discharged to:: Private residence Living Arrangements: Alone;Other relatives Available Help at Discharge: Family;Available PRN/intermittently Type of Home: Apartment Home Access: Stairs to enter Entrance Stairs-Rails: Left;Right;Can reach both Entrance Stairs-Number of Steps: 3 Home Layout: One level Home Equipment: Walker - 4 wheels;Crutches;Grab bars - tub/shower      Prior Function Level of Independence: Independent         Comments: Ind amb community distances without an AD, 20+ falls in the last 6 months related to intoxication, Ind with ADLs     Hand  Dominance        Extremity/Trunk Assessment   Upper Extremity Assessment Upper Extremity Assessment: Generalized weakness    Lower Extremity Assessment Lower Extremity  Assessment: Generalized weakness       Communication   Communication: Other (comment) (Slow to process questions and to respond)  Cognition Arousal/Alertness: Lethargic Behavior During Therapy: Flat affect Overall Cognitive Status: No family/caregiver present to determine baseline cognitive functioning                                 General Comments: Pt very slow to respond to questions, required extra time and cuing to follow commands      General Comments      Exercises  Static sitting at EOB with some reaching outside BOS   Assessment/Plan    PT Assessment Patient needs continued PT services  PT Problem List Decreased activity tolerance;Decreased balance;Decreased strength;Decreased mobility;Decreased knowledge of use of DME;Decreased cognition;Decreased safety awareness       PT Treatment Interventions DME instruction;Gait training;Stair training;Functional mobility training;Therapeutic activities;Therapeutic exercise;Balance training;Patient/family education    PT Goals (Current goals can be found in the Care Plan section)  Acute Rehab PT Goals Patient Stated Goal: To be more independent PT Goal Formulation: With patient Time For Goal Achievement: 01/27/21 Potential to Achieve Goals: Good    Frequency Min 2X/week   Barriers to discharge Inaccessible home environment;Decreased caregiver support      Co-evaluation               AM-PAC PT "6 Clicks" Mobility  Outcome Measure Help needed turning from your back to your side while in a flat bed without using bedrails?: A Lot Help needed moving from lying on your back to sitting on the side of a flat bed without using bedrails?: A Lot Help needed moving to and from a bed to a chair (including a wheelchair)?: A Lot Help needed standing up from a chair using your arms (e.g., wheelchair or bedside chair)?: A Lot Help needed to walk in hospital room?: Total Help needed climbing 3-5 steps with a  railing? : Total 6 Click Score: 10    End of Session Equipment Utilized During Treatment: Gait belt Activity Tolerance: Patient tolerated treatment well Patient left: in bed;with nursing/sitter in room;Other (comment) (Pt left with nursing for hygiene) Nurse Communication: Mobility status PT Visit Diagnosis: Unsteadiness on feet (R26.81);History of falling (Z91.81);Repeated falls (R29.6);Difficulty in walking, not elsewhere classified (R26.2);Muscle weakness (generalized) (M62.81)    Time: 2500-3704 PT Time Calculation (min) (ACUTE ONLY): 33 min   Charges:   PT Evaluation $PT Eval Moderate Complexity: 1 Mod PT Treatments $Therapeutic Activity: 8-22 mins        D. Dhaval Jamir Rone PT, DPT 01/14/21, 3:03 PM

## 2021-01-14 NOTE — Progress Notes (Signed)
Speech Language Pathology Treatment: Dysphagia  Patient Details Name: Joshua Medina MRN: [id number] DOB: 1986-05-01 Today's Date: 01/14/2021 Time: 0820-0905 SLP Time Calculation (min) (ACUTE ONLY): 45 min  Assessment / Plan / Recommendation Clinical Impression  Pt seen today for ongoing assessment of toleration of diet. Pt continues to present w/ declined Mental Status post w/ impact from ETOH, delirium, seizure activity, and meds; he requires 100% Support and monitoring at meals to assist w/ oral intake. The mental status decline impacta his overall awareness/engagement w/ po tasks which can increase risk for choking, aspiration. This can be reduced when following general aspiration precautions, prepping tray, and assisting w/ feeding as needed. Also pt benefits from verbal/visual cues as he attends to some self-feeding - Holding Cup also when drinking. Pt stated he needed to "take it to the jungle" when asked orientation questions; he was oriented to name only.  Pt consumed trials of thin liquids, purees, and soft solids w/ No overt clinical s/s of aspiration noted; no decline in phonations, no cough, and no decline in respiratory status during/post trials. Oral phase was grossly adequate for bolus management/control and oral clearing of the boluses when given TIME. Time was necessary d/t slower bolus manipulation and A-P transfer w/ some trials; he was encouraged to use lingual sweeping to ensure oral clearing which took Time as well. Discussed diet consistency of foods, pills in puree, support and monitoring at meals, and aspiration precautions w/ NSG -- d/t pt's declined mental status overall. Supervision is recommended. Precautions posted in room.  Recommend continue a Regular diet w/ cut/chopped meats, gravies to moisten foods w/ Thin liquids; general aspiration precautions; reduce Distractions during meals. Pills in Puree for safer swallowing. Support w/ feeding at meals -- check for oral clearing  during/post intake. Supervision in general d/t mental status decline. NSG/SW updated. NSG to reconsult ST services if new needs arise while admitted.      HPI HPI: Pt is a 35 y.o. male with medical history significant of malnutrition, Graves Dis., hypothyroidism, alcohol liver disease, alcohol withdrawal seizure, delirium presented with seizure, altered mental status. In ED patient was give folic acid, thiamine,  lactulose, Ativan.  CT head: No evidence of acute intracranial abnormality. Age advanced cerebral and cerebellar atrophy.  CXR: Mild left base atelectasis.  Pt was admitted w/ dx of Alcohol abuse, alcohol withdrawal seizure: Patient's seizure is most likely due to alcohol withdrawal seizure. Patient is a heavy drinker and had hx of withdrawal seizures. Last drink 2 days ago.      SLP Plan  All goals met       Recommendations  Diet recommendations: Regular;Dysphagia 3 (mechanical soft);Thin liquid (cut meats, gravy; no salads) Liquids provided via: Cup;Straw Medication Administration: Whole meds with puree (as needed for safer swallowing per NSG) Supervision: Patient able to self feed;Staff to assist with self feeding;Intermittent supervision to cue for compensatory strategies Compensations: Minimize environmental distractions;Slow rate;Small sips/bites;Lingual sweep for clearance of pocketing;Follow solids with liquid Postural Changes and/or Swallow Maneuvers: Out of bed for meals;Seated upright 90 degrees;Upright 30-60 min after meal                General recommendations:  (Dietician f/u) Oral Care Recommendations: Oral care BID;Oral care before and after PO;Staff/trained caregiver to provide oral care (confusion) Follow up Recommendations: None SLP Visit Diagnosis: Dysphagia, unspecified (R13.10) (ETOH, seizure, meds) Plan: All goals met       GO  Orinda Kenner, MS, CCC-SLP Speech Language Pathologist Rehab  Services [phone] [facility] 01/14/2021, 2:06 PM

## 2021-01-14 NOTE — Progress Notes (Addendum)
PROGRESS NOTE    Joshua Medina  YBW:[id number] DOB: Dec 16, 1985 DOA: 01/11/2021 PCP: Pcp, No  Outpatient Specialists: none    Brief Narrative:    Joshua Medina is a 35 y.o. male with medical history significant of malnutrition, hypothyroidism, alcohol liver disease, alcohol withdrawal seizure, delirium presented with seizure, altered mental status  When I saw patient, patient is lethargic, not good historian, oriented to time and people, he thought he is in [place], he could not recall seizure episode, does report last drinking 2 days ago, reported to have some loose stool yesterday, otherwise patient reports no  fever, chills, chest pain, shortness of breath, cough, abdominal pain,  dysuria, leg swelling.  Per Chart review, EMS was reported from family that patient may had a seizure, EMS found the patient has been shaking/tremors,  patient was sent to the ED   ED Course: pt was found to have   temperature 97.6, heart rate 113-122, respiratory 16, blood pressure 101/82, oxygen saturation 100% on room air.  Labs show sodium sodium 128, chloride 79, bicarb 12 glucose 115, iron 77, lipase 1100 AST 412,  ALT 103, ammonia 97, total bilirubin 12.2, hemoglobin 9.4, platelets 37, INR 1.5, acetaminophen level less than 10, influenza negative/negative, alcohol level less than 10 CT head: No evidence of acute intracranial abnormality. Age advanced cerebral and cerebellar atrophy. Mild right ethmoid sinusitis.  In ED patient was give folic acid, thiamine,  lactulose, Ativan Hospitalist was requested to admit patient   Assessment & Plan:   Active Problems:   Hypothyroidism (acquired)   Alcoholic liver disease (HCC)   Alcohol withdrawal seizure (HCC)   Altered mental status   Hyponatremia   # Acute encephalopathy  Likely multifactorial, postictal status alcohol withdrawal seizure versus? Hepatic encephalopathy. Improving - close monitoring  #Alcohol abuse, alcohol withdrawal seizure:  Patient's seizure is most likely due to alcohol withdrawal seizure.  Patient is a heavy drinker and had hx of withdrawal seizures. Last drink 2 days prior to admit. Ct-head is negative for acute intracranial abnormalities. No focal neurological findings on physical examination. Required precedex, pressors in icu last hospitalization. Remains somewhat confused. His aunt in room today, reports patient's wife died recently of alcoholism. - CIWA protocol - Thiamine & Folate, will start wernicke's treatment w/ thiamine 500 tid for the next 3 days - off ifv - Seizure precautions  #Alcoholic liver disease,?  Liver cirrhosis. Thrombocytopenia MEL D sodium score 26, 90 days mortality 14-15% Maddrey'ss discrimination function 24.2, no indication for steroids. Plts improving - monitor - protonix 40 qd ppx - s/p vitamin k  # hyponatremia: Likely potomania secondary to alcohol abuse Improving with fluids  #Elevated ammonia,?  Hepatic encephalopathy: Likely chronic, 96 on 06/14/1999 - daily lactulose started monitor stooling  #elevated lipase:  CT neg, asymptomatic, likely 2/2 liver disease - monitor  #Hypothyroidism: TSH elevated - resume home levothyroxine  # Hypomagenesemia # Hypophosphatemia # Hypokalemia Today k 3.3, phos 1.4, mg 1.7 - 40 kcl bid, additional 40 ordered - sodium phos ordered - mg 2 g iv ordered  # Anemia  H 7.2, no signs bleeding, stable - monitor   DVT prophylaxis: scds (thrombocytopenia) Code Status: full Family Communication: mother updated telephonically 5/27  Level of care: Progressive Cardiac Status is: Inpatient  Remains inpatient appropriate because:Inpatient level of care appropriate due to severity of illness   Dispo: The patient is from: Home              Anticipated d/c is to: tbd  Patient currently is not medically stable to d/c.   Difficult to place patient No        Consultants:   none  Procedures: none  Antimicrobials:  none    Subjective: This morning awake. Speech slurred, no pain, no n/v/d. Tolerating clears. Slightly tremulous  Objective: Vitals:   01/14/21 0646 01/14/21 0724 01/14/21 0748 01/14/21 1147  BP: (!) 86/62 101/74 92/69 (!) 88/65  Pulse: (!) 101 (!) 102 (!) 105 (!) 104  Resp: 20 20 16 16   Temp: 98.7 F (37.1 C) 97.8 F (36.6 C) 98.4 F (36.9 C) 99.5 F (37.5 C)  TempSrc: Oral Oral Oral Axillary  SpO2: 100% 99% 99% 100%  Weight:      Height:        Intake/Output Summary (Last 24 hours) at 01/14/2021 1213 Last data filed at 01/14/2021 1000 Gross per 24 hour  Intake 600 ml  Output 650 ml  Net -50 ml   Filed Weights   01/11/21 1634  Weight: 63 kg    Examination:  General: chronically ill appearing, dissheveled HEENT:atraumatic, mmm Heme: No neck lymph node enlargement. Cardiac: S1/S2, RRR, No murmurs, No gallops or rubs. Respiratory: Good air movement bilaterally. No rales, wheezing, rhonchi or rubs. GI: Soft, nondistended, nontender, no rebound pain, no organomegaly, BS present. GU: No hematuria Ext: No pitting leg edema bilaterally. 2+DP/PT pulse bilaterally. Musculoskeletal: No joint deformities, No joint redness or warmth, no limitation of ROM in spin. Skin: No rashes. jaundiced Neuro: Lethargic,  oriented X2, freely move 4 extremities, no focal neurodeficit   Data Reviewed: I have personally reviewed following labs and imaging studies  CBC: Recent Labs  Lab 01/11/21 1633 01/12/21 0321 01/13/21 0445 01/14/21 0334  WBC 9.8 8.2 7.0 5.9  NEUTROABS 7.9*  --   --   --   HGB 9.4* 7.4* 7.4* 7.2*  HCT 26.4* 20.6* 21.0* 21.1*  MCV 106.0* 104.0* 106.1* 109.9*  PLT 37* 36* 43* 53*   Basic Metabolic Panel: Recent Labs  Lab 01/11/21 1633 01/12/21 0321 01/12/21 1446 01/13/21 0445 01/14/21 0334  NA 128* 130* 133* 135 133*  K 4.4 2.7* 2.9* 3.1* 3.3*  CL 79* 88* 93* 98 100  CO2 12* 27 26 27 24   GLUCOSE 115*  242* 166* 121* 107*  BUN 9 8 7  5* <5*  CREATININE 0.91 0.68 0.47* 0.47* 0.48*  CALCIUM 8.1* 7.0* 7.0* 6.9* 6.9*  MG 1.2* 1.7  --  1.7 1.8  PHOS 1.2* 1.0*  --  <1.0* 1.4*   GFR: Estimated Creatinine Clearance: 113.8 mL/min (A) (by C-G formula based on SCr of 0.48 mg/dL (L)). Liver Function Tests: Recent Labs  Lab 01/11/21 1633 01/12/21 0321 01/12/21 1446 01/13/21 0445 01/14/21 0334  AST 412* 324* 324* 353* 323*  ALT 103* 87* 90* 99* 105*  ALKPHOS 359* 258* 293* 296* 306*  BILITOT 12.2* 11.0* 10.8* 11.0* 10.6*  PROT 7.6 6.2* 5.9* 5.9* 5.7*  ALBUMIN 3.9 3.1* 3.0* 3.1* 2.9*   Recent Labs  Lab 01/11/21 1633 01/12/21 0321  LIPASE 1,110* 650*  AMYLASE  --  256*   Recent Labs  Lab 01/11/21 1633 01/12/21 0321  AMMONIA 97* 37*   Coagulation Profile: Recent Labs  Lab 01/11/21 1633 01/13/21 0445 01/14/21 0334  INR 1.5* 1.5* 1.3*   Cardiac Enzymes: No results for input(s): CKTOTAL, CKMB, CKMBINDEX, TROPONINI in the last 168 hours. BNP (last 3 results) No results for input(s): PROBNP in the last 8760 hours. HbA1C: No results for input(s): HGBA1C in the last  72 hours. CBG: No results for input(s): GLUCAP in the last 168 hours. Lipid Profile: No results for input(s): CHOL, HDL, LDLCALC, TRIG, CHOLHDL, LDLDIRECT in the last 72 hours. Thyroid Function Tests: Recent Labs    01/11/21 1633  TSH 21.907*   Anemia Panel: No results for input(s): VITAMINB12, FOLATE, FERRITIN, TIBC, IRON, RETICCTPCT in the last 72 hours. Urine analysis:    Component Value Date/Time   COLORURINE AMBER (A) 02/16/2020 0446   APPEARANCEUR HAZY (A) 02/16/2020 0446   LABSPEC 1.011 02/16/2020 0446   PHURINE 7.0 02/16/2020 0446   GLUCOSEU NEGATIVE 02/16/2020 0446   HGBUR NEGATIVE 02/16/2020 0446   BILIRUBINUR SMALL (A) 02/16/2020 0446   KETONESUR NEGATIVE 02/16/2020 0446   PROTEINUR 100 (A) 02/16/2020 0446   NITRITE NEGATIVE 02/16/2020 0446   LEUKOCYTESUR NEGATIVE 02/16/2020 0446   Sepsis  Labs: @LABRCNTIP (procalcitonin:4,lacticidven:4)  ) Recent Results (from the past 240 hour(s))  Resp Panel by RT-PCR (Flu A&B, Covid) Nasopharyngeal Swab     Status: None   Collection Time: 01/11/21  4:33 PM   Specimen: Nasopharyngeal Swab; Nasopharyngeal(NP) swabs in vial transport medium  Result Value Ref Range Status   SARS Coronavirus 2 by RT PCR NEGATIVE NEGATIVE Final    Comment: (NOTE) SARS-CoV-2 target nucleic acids are NOT DETECTED.  The SARS-CoV-2 RNA is generally detectable in upper respiratory specimens during the acute phase of infection. The lowest concentration of SARS-CoV-2 viral copies this assay can detect is 138 copies/mL. A negative result does not preclude SARS-Cov-2 infection and should not be used as the sole basis for treatment or other patient management decisions. A negative result may occur with  improper specimen collection/handling, submission of specimen other than nasopharyngeal swab, presence of viral mutation(s) within the areas targeted by this assay, and inadequate number of viral copies(<138 copies/mL). A negative result must be combined with clinical observations, patient history, and epidemiological information. The expected result is Negative.  Fact Sheet for Patients:  01/13/21  Fact Sheet for Healthcare Providers:  BloggerCourse.com  This test is no t yet approved or cleared by the [employer] FDA and  has been authorized for detection and/or diagnosis of SARS-CoV-2 by FDA under an Emergency Use Authorization (EUA). This EUA will remain  in effect (meaning this test can be used) for the duration of the COVID-19 declaration under Section 564(b)(1) of the Act, 21 U.S.C.section 360bbb-3(b)(1), unless the authorization is terminated  or revoked sooner.       Influenza A by PCR NEGATIVE NEGATIVE Final   Influenza B by PCR NEGATIVE NEGATIVE Final    Comment: (NOTE) The Xpert  Xpress SARS-CoV-2/FLU/RSV plus assay is intended as an aid in the diagnosis of influenza from Nasopharyngeal swab specimens and should not be used as a sole basis for treatment. Nasal washings and aspirates are unacceptable for Xpert Xpress SARS-CoV-2/FLU/RSV testing.  Fact Sheet for Patients: [place]  Fact Sheet for Healthcare Providers: BloggerCourse.com  This test is not yet approved or cleared by the [employer] FDA and has been authorized for detection and/or diagnosis of SARS-CoV-2 by FDA under an Emergency Use Authorization (EUA). This EUA will remain in effect (meaning this test can be used) for the duration of the COVID-19 declaration under Section 564(b)(1) of the Act, 21 U.S.C. section 360bbb-3(b)(1), unless the authorization is terminated or revoked.  Performed at [facility], [address] [place], [place]          Radiology Studies: No results found.      Scheduled Meds: .  feeding supplement  237 mL Oral BID BM  . folic acid  1 mg Oral Daily  . lactulose  20 g Oral Daily  . levothyroxine  100 mcg Oral Q0600  . LORazepam  0-4 mg Intravenous Q12H   Or  . LORazepam  0-4 mg Oral Q12H  . magnesium oxide  400 mg Oral BID  . multivitamin with minerals  1 tablet Oral Daily  . pantoprazole  40 mg Oral Daily  . potassium chloride  40 mEq Oral BID  . thiamine  100 mg Oral Daily   Or  . thiamine  100 mg Intravenous Daily   Continuous Infusions: . potassium PHOSPHATE IVPB (in mmol) 30 mmol (01/14/21 1140)     LOS: 3 days    Time spent: 45 min    Silvano Bilis, MD [facility]   If 7PM-7AM, please contact night-coverage [email or web address] Password [facility] 01/14/2021, 12:13 PM

## 2021-01-14 NOTE — TOC Progression Note (Signed)
Transition of Care [facility]) - Progression Note    Patient Details  Name: Joshua Medina MRN: [id number] Date of Birth: March 24, 1986  Transition of Care [facility]) CM/SW Contact  Hetty Ely, RN Phone Number: 01/14/2021, 3:36 PM  Clinical Narrative:  Substance Abuse resource list given to patient as requested. Patient receptive and voices interest in seeking help with alcoholism.          Expected Discharge Plan and Services                                                 Social Determinants of Health (SDOH) Interventions    Readmission Risk Interventions No flowsheet data found.

## 2021-01-14 NOTE — Consult Note (Signed)
PHARMACY CONSULT NOTE - FOLLOW UP  Pharmacy Consult for Electrolyte Monitoring and Replacement   Recent Labs: Potassium (mmol/L)  Date Value  01/14/2021 3.3 (L)   Magnesium (mg/dL)  Date Value  54/04/8118 1.8   Calcium (mg/dL)  Date Value  14/78/2956 6.9 (L)   Albumin (g/dL)  Date Value  21/30/8657 2.9 (L)   Phosphorus (mg/dL)  Date Value  84/69/6295 1.4 (L)   Sodium (mmol/L)  Date Value  01/14/2021 133 (L)     Assessment: 35 y.o.malewith medical history significant ofmalnutrition, hypothyroidism, alcohol liver disease, alcohol withdrawal seizure, delirium presented with seizure, altered mental status  Goal of Therapy:  WNL  Plan:  NS with KCl 40 mEq @ 100 ml/hr was discontinued. Continue KCl 40 mEq BID. Will given an additional 40 mEq today x 1. Will orderd KPhos IV 30 mmol x1, and will order Mg 2 g IV x 1.  F/u with AM labs.   Ronnald Ramp ,PharmD Clinical Pharmacist 01/14/2021 8:24 AM

## 2021-01-15 LAB — CBC
HCT: 22.4 % — ABNORMAL LOW (ref 39.0–52.0)
Hemoglobin: 7.7 g/dL — ABNORMAL LOW (ref 13.0–17.0)
MCH: 37.9 pg — ABNORMAL HIGH (ref 26.0–34.0)
MCHC: 34.4 g/dL (ref 30.0–36.0)
MCV: 110.3 fL — ABNORMAL HIGH (ref 80.0–100.0)
Platelets: 60 10*3/uL — ABNORMAL LOW (ref 150–400)
RBC: 2.03 MIL/uL — ABNORMAL LOW (ref 4.22–5.81)
RDW: 19.5 % — ABNORMAL HIGH (ref 11.5–15.5)
WBC: 6.1 10*3/uL (ref 4.0–10.5)
nRBC: 2.1 % — ABNORMAL HIGH (ref 0.0–0.2)

## 2021-01-15 LAB — PHOSPHORUS: Phosphorus: 1.7 mg/dL — ABNORMAL LOW (ref 2.5–4.6)

## 2021-01-15 LAB — COMPREHENSIVE METABOLIC PANEL
ALT: 103 U/L — ABNORMAL HIGH (ref 0–44)
AST: 280 U/L — ABNORMAL HIGH (ref 15–41)
Albumin: 2.8 g/dL — ABNORMAL LOW (ref 3.5–5.0)
Alkaline Phosphatase: 334 U/L — ABNORMAL HIGH (ref 38–126)
Anion gap: 9 (ref 5–15)
BUN: <5 mg/dL — ABNORMAL LOW (ref 6–20)
CO2: 23 mmol/L (ref 22–32)
Calcium: 7.3 mg/dL — ABNORMAL LOW (ref 8.9–10.3)
Chloride: 101 mmol/L (ref 98–111)
Creatinine, Ser: 0.56 mg/dL — ABNORMAL LOW (ref 0.61–1.24)
GFR, Estimated: >60 mL/min (ref >60)
Glucose, Bld: 109 mg/dL — ABNORMAL HIGH (ref 70–99)
Potassium: 4.1 mmol/L (ref 3.5–5.1)
Sodium: 133 mmol/L — ABNORMAL LOW (ref 135–145)
Total Bilirubin: 10.1 mg/dL — ABNORMAL HIGH (ref 0.3–1.2)
Total Protein: 5.6 g/dL — ABNORMAL LOW (ref 6.5–8.1)

## 2021-01-15 LAB — PROTIME-INR
INR: 1.3 — ABNORMAL HIGH (ref 0.8–1.2)
Prothrombin Time: 16.4 seconds — ABNORMAL HIGH (ref 11.4–15.2)

## 2021-01-15 LAB — MAGNESIUM: Magnesium: 1.7 mg/dL (ref 1.7–2.4)

## 2021-01-15 MED ORDER — MAGNESIUM SULFATE 2 GM/50ML IV SOLN
2.0000 g | Freq: Once | INTRAVENOUS | Status: AC
  Administered 2021-01-15: 2 g via INTRAVENOUS
  Filled 2021-01-15: qty 50

## 2021-01-15 MED ORDER — SODIUM CHLORIDE 0.9 % IV SOLN
INTRAVENOUS | Status: DC | PRN
  Administered 2021-01-15: 250 mL via INTRAVENOUS

## 2021-01-15 MED ORDER — LACTULOSE 10 GM/15ML PO SOLN
20.0000 g | Freq: Two times a day (BID) | ORAL | Status: DC
  Administered 2021-01-15 – 2021-01-17 (×4): 20 g via ORAL
  Filled 2021-01-15 (×4): qty 30

## 2021-01-15 MED ORDER — POTASSIUM CHLORIDE CRYS ER 20 MEQ PO TBCR
20.0000 meq | EXTENDED_RELEASE_TABLET | Freq: Two times a day (BID) | ORAL | Status: DC
  Administered 2021-01-15 – 2021-01-17 (×5): 20 meq via ORAL
  Filled 2021-01-15 (×5): qty 1

## 2021-01-15 MED ORDER — SODIUM PHOSPHATES 45 MMOLE/15ML IV SOLN
20.0000 mmol | Freq: Once | INTRAVENOUS | Status: AC
  Administered 2021-01-15: 20 mmol via INTRAVENOUS
  Filled 2021-01-15: qty 6.67

## 2021-01-15 NOTE — Consult Note (Signed)
PHARMACY CONSULT NOTE - FOLLOW UP  Pharmacy Consult for Electrolyte Monitoring and Replacement   Recent Labs: Potassium (mmol/L)  Date Value  01/15/2021 4.1   Magnesium (mg/dL)  Date Value  16/08/930 1.7   Calcium (mg/dL)  Date Value  35/57/3220 7.3 (L)   Albumin (g/dL)  Date Value  25/42/7062 2.8 (L)   Phosphorus (mg/dL)  Date Value  37/62/8315 1.7 (L)   Sodium (mmol/L)  Date Value  01/15/2021 133 (L)     Assessment: 35 y.o.malewith medical history significant ofmalnutrition, hypothyroidism, alcohol liver disease, alcohol withdrawal seizure, delirium presented with seizure, altered mental status  Goal of Therapy:  WNL  Plan:  K 4.1  Mag 1.7  Phos 1.7  Scr 0.56 -Patient on Mag OX 400mg  bid and KCl 40 mEq BID. - Will order Sodium Phosphate IV 20 mmol x1, and will order Magnesium 2 g IV x 1.  -will decrease KCL to 20 meq PO BID F/u with AM labs.   ,PharmD Clinical Pharmacist 01/15/2021 8:12 AM

## 2021-01-15 NOTE — Progress Notes (Signed)
MD made aware of continuous yellow mews from overnight and currently throughout shift. No new orders given at this time. NAD noted. Patient remains asymptomatic.

## 2021-01-15 NOTE — Progress Notes (Signed)
PROGRESS NOTE    Joshua Medina  PNT:[id number] DOB: 08-07-1986 DOA: 01/11/2021 PCP: Pcp, No  Outpatient Specialists: none    Brief Narrative:    Joshua Medina is a 35 y.o. male with medical history significant of malnutrition, hypothyroidism, alcohol liver disease, alcohol withdrawal seizure, delirium presented with seizure, altered mental status  When I saw patient, patient is lethargic, not good historian, oriented to time and people, he thought he is in [place], he could not recall seizure episode, does report last drinking 2 days ago, reported to have some loose stool yesterday, otherwise patient reports no  fever, chills, chest pain, shortness of breath, cough, abdominal pain,  dysuria, leg swelling.  Per Chart review, EMS was reported from family that patient may had a seizure, EMS found the patient has been shaking/tremors,  patient was sent to the ED   ED Course: pt was found to have   temperature 97.6, heart rate 113-122, respiratory 16, blood pressure 101/82, oxygen saturation 100% on room air.  Labs show sodium sodium 128, chloride 79, bicarb 12 glucose 115, iron 77, lipase 1100 AST 412,  ALT 103, ammonia 97, total bilirubin 12.2, hemoglobin 9.4, platelets 37, INR 1.5, acetaminophen level less than 10, influenza negative/negative, alcohol level less than 10 CT head: No evidence of acute intracranial abnormality. Age advanced cerebral and cerebellar atrophy. Mild right ethmoid sinusitis.  In ED patient was give folic acid, thiamine,  lactulose, Ativan Hospitalist was requested to admit patient   Assessment & Plan:   Active Problems:   Hypothyroidism (acquired)   Alcoholic liver disease (HCC)   Alcohol withdrawal seizure (HCC)   Altered mental status   Hyponatremia   # Acute encephalopathy  Likely multifactorial, postictal status alcohol withdrawal seizure versus? Hepatic encephalopathy. Improving - close monitoring  # Debility PT advises SNF, pt has refused -  likely home with home health pt/ot  #Alcohol abuse, alcohol withdrawal seizure: Patient's seizure is most likely due to alcohol withdrawal seizure.  Patient is a heavy drinker and had hx of withdrawal seizures. Last drink 2 days prior to admit. Ct-head is negative for acute intracranial abnormalities. No focal neurological findings on physical examination. Required precedex, pressors in icu last hospitalization. Remains somewhat confused. His aunt in room today, reports patient's wife died recently of alcoholism. - CIWA protocol, last ativan was 5/27 - Thiamine & Folate, have started wernicke's treatment w/ thiamine 500 tid for the next 3 days (started 5/27) - off ifv - Seizure precautions  #Alcoholic liver disease,?  Liver cirrhosis. Thrombocytopenia MEL D sodium score 26, 90 days mortality 14-15% Maddrey'ss discrimination function 24.2, no indication for steroids. Plts improving - monitor - protonix 40 qd ppx - s/p vitamin k  # hyponatremia: Likely potomania secondary to alcohol abuse Improving with fluids  #Elevated ammonia,?  Hepatic encephalopathy: Likely chronic, 96 on 06/14/1999 - daily lactulose, stooling once a day, will increase dose  #elevated lipase:  CT neg, asymptomatic, likely 2/2 liver disease - monitor  #Hypothyroidism: TSH elevated - resume home levothyroxine  # Hypomagenesemia # Hypophosphatemia # Hypokalemia Today k 4.1, phos 1.7, mg 1.7 - 20 kcl bid,  - sodium phos ordered - mg 2 g iv ordered  # Anemia  H 7.7, no signs bleeding, stable - monitor   DVT prophylaxis: scds (thrombocytopenia) Code Status: full Family Communication: mother updated telephonically 5/27  Level of care: Progressive Cardiac Status is: Inpatient  Remains inpatient appropriate because:Inpatient level of care appropriate due to severity of illness   Dispo: The  patient is from: Home              Anticipated d/c is to: tbd              Patient currently is not  medically stable to d/c.   Difficult to place patient No        Consultants:  none  Procedures: none  Antimicrobials:  none    Subjective: This morning awake. Speech slurred, no pain, no n/v/d. Tolerating diet. Tremor improved  Objective: Vitals:   01/15/21 0103 01/15/21 0442 01/15/21 1038 01/15/21 1040  BP: 96/63 92/64  (!) 98/56  Pulse: (!) 106 (!) 116  (!) 101  Resp: 16 20    Temp: 98.5 F (36.9 C) 98.7 F (37.1 C)  98.7 F (37.1 C)  TempSrc: Oral Oral Oral   SpO2: 100% 98%  98%  Weight:      Height:        Intake/Output Summary (Last 24 hours) at 01/15/2021 1107 Last data filed at 01/15/2021 0457 Gross per 24 hour  Intake 0 ml  Output 1300 ml  Net -1300 ml   Filed Weights   01/11/21 1634  Weight: 63 kg    Examination:  General: chronically ill appearing, dissheveled HEENT:atraumatic, mmm Heme: No neck lymph node enlargement. Cardiac: S1/S2, RRR, No murmurs, No gallops or rubs. Respiratory: Good air movement bilaterally. No rales, wheezing, rhonchi or rubs. GI: Soft, nondistended, nontender, no rebound pain, no organomegaly, BS present. GU: No hematuria Ext: No pitting leg edema bilaterally. 2+DP/PT pulse bilaterally. Musculoskeletal: No joint deformities, No joint redness or warmth, no limitation of ROM in spin. Skin: No rashes. jaundiced Neuro: Lethargic,  oriented X2, freely move 4 extremities, no focal neurodeficit   Data Reviewed: I have personally reviewed following labs and imaging studies  CBC: Recent Labs  Lab 01/11/21 1633 01/12/21 0321 01/13/21 0445 01/14/21 0334 01/15/21 0526  WBC 9.8 8.2 7.0 5.9 6.1  NEUTROABS 7.9*  --   --   --   --   HGB 9.4* 7.4* 7.4* 7.2* 7.7*  HCT 26.4* 20.6* 21.0* 21.1* 22.4*  MCV 106.0* 104.0* 106.1* 109.9* 110.3*  PLT 37* 36* 43* 53* 60*   Basic Metabolic Panel: Recent Labs  Lab 01/11/21 1633 01/12/21 0321 01/12/21 1446 01/13/21 0445 01/14/21 0334 01/15/21 0526  NA 128* 130* 133* 135 133*  133*  K 4.4 2.7* 2.9* 3.1* 3.3* 4.1  CL 79* 88* 93* 98 100 101  CO2 12* 27 26 27 24 23   GLUCOSE 115* 242* 166* 121* 107* 109*  BUN 9 8 7  5* <5* <5*  CREATININE 0.91 0.68 0.47* 0.47* 0.48* 0.56*  CALCIUM 8.1* 7.0* 7.0* 6.9* 6.9* 7.3*  MG 1.2* 1.7  --  1.7 1.8 1.7  PHOS 1.2* 1.0*  --  <1.0* 1.4* 1.7*   GFR: Estimated Creatinine Clearance: 113.8 mL/min (A) (by C-G formula based on SCr of 0.56 mg/dL (L)). Liver Function Tests: Recent Labs  Lab 01/12/21 0321 01/12/21 1446 01/13/21 0445 01/14/21 0334 01/15/21 0526  AST 324* 324* 353* 323* 280*  ALT 87* 90* 99* 105* 103*  ALKPHOS 258* 293* 296* 306* 334*  BILITOT 11.0* 10.8* 11.0* 10.6* 10.1*  PROT 6.2* 5.9* 5.9* 5.7* 5.6*  ALBUMIN 3.1* 3.0* 3.1* 2.9* 2.8*   Recent Labs  Lab 01/11/21 1633 01/12/21 0321  LIPASE 1,110* 650*  AMYLASE  --  256*   Recent Labs  Lab 01/11/21 1633 01/12/21 0321  AMMONIA 97* 37*   Coagulation Profile: Recent Labs  Lab 01/11/21 1633 01/13/21 0445 01/14/21 0334 01/15/21 0526  INR 1.5* 1.5* 1.3* 1.3*   Cardiac Enzymes: No results for input(s): CKTOTAL, CKMB, CKMBINDEX, TROPONINI in the last 168 hours. BNP (last 3 results) No results for input(s): PROBNP in the last 8760 hours. HbA1C: No results for input(s): HGBA1C in the last 72 hours. CBG: No results for input(s): GLUCAP in the last 168 hours. Lipid Profile: No results for input(s): CHOL, HDL, LDLCALC, TRIG, CHOLHDL, LDLDIRECT in the last 72 hours. Thyroid Function Tests: No results for input(s): TSH, T4TOTAL, FREET4, T3FREE, THYROIDAB in the last 72 hours. Anemia Panel: No results for input(s): VITAMINB12, FOLATE, FERRITIN, TIBC, IRON, RETICCTPCT in the last 72 hours. Urine analysis:    Component Value Date/Time   COLORURINE AMBER (A) 02/16/2020 0446   APPEARANCEUR HAZY (A) 02/16/2020 0446   LABSPEC 1.011 02/16/2020 0446   PHURINE 7.0 02/16/2020 0446   GLUCOSEU NEGATIVE 02/16/2020 0446   HGBUR NEGATIVE 02/16/2020 0446    BILIRUBINUR SMALL (A) 02/16/2020 0446   KETONESUR NEGATIVE 02/16/2020 0446   PROTEINUR 100 (A) 02/16/2020 0446   NITRITE NEGATIVE 02/16/2020 0446   LEUKOCYTESUR NEGATIVE 02/16/2020 0446   Sepsis Labs: @LABRCNTIP (procalcitonin:4,lacticidven:4)  ) Recent Results (from the past 240 hour(s))  Resp Panel by RT-PCR (Flu A&B, Covid) Nasopharyngeal Swab     Status: None   Collection Time: 01/11/21  4:33 PM   Specimen: Nasopharyngeal Swab; Nasopharyngeal(NP) swabs in vial transport medium  Result Value Ref Range Status   SARS Coronavirus 2 by RT PCR NEGATIVE NEGATIVE Final    Comment: (NOTE) SARS-CoV-2 target nucleic acids are NOT DETECTED.  The SARS-CoV-2 RNA is generally detectable in upper respiratory specimens during the acute phase of infection. The lowest concentration of SARS-CoV-2 viral copies this assay can detect is 138 copies/mL. A negative result does not preclude SARS-Cov-2 infection and should not be used as the sole basis for treatment or other patient management decisions. A negative result may occur with  improper specimen collection/handling, submission of specimen other than nasopharyngeal swab, presence of viral mutation(s) within the areas targeted by this assay, and inadequate number of viral copies(<138 copies/mL). A negative result must be combined with clinical observations, patient history, and epidemiological information. The expected result is Negative.  Fact Sheet for Patients:  01/13/21  Fact Sheet for Healthcare Providers:  BloggerCourse.com  This test is no t yet approved or cleared by the [employer] FDA and  has been authorized for detection and/or diagnosis of SARS-CoV-2 by FDA under an Emergency Use Authorization (EUA). This EUA will remain  in effect (meaning this test can be used) for the duration of the COVID-19 declaration under Section 564(b)(1) of the Act, 21 U.S.C.section  360bbb-3(b)(1), unless the authorization is terminated  or revoked sooner.       Influenza A by PCR NEGATIVE NEGATIVE Final   Influenza B by PCR NEGATIVE NEGATIVE Final    Comment: (NOTE) The Xpert Xpress SARS-CoV-2/FLU/RSV plus assay is intended as an aid in the diagnosis of influenza from Nasopharyngeal swab specimens and should not be used as a sole basis for treatment. Nasal washings and aspirates are unacceptable for Xpert Xpress SARS-CoV-2/FLU/RSV testing.  Fact Sheet for Patients: [place]  Fact Sheet for Healthcare Providers: BloggerCourse.com  This test is not yet approved or cleared by the [employer] FDA and has been authorized for detection and/or diagnosis of SARS-CoV-2 by FDA under an Emergency Use Authorization (EUA). This EUA will remain in effect (meaning this test can be used) for  the duration of the COVID-19 declaration under Section 564(b)(1) of the Act, 21 U.S.C. section 360bbb-3(b)(1), unless the authorization is terminated or revoked.  Performed at [facility], [address] [place], [place] [id number]          Radiology Studies: No results found.      Scheduled Meds: . feeding supplement  237 mL Oral BID BM  . folic acid  1 mg Oral Daily  . lactulose  20 g Oral Daily  . levothyroxine  100 mcg Oral Q0600  . LORazepam  0-4 mg Intravenous Q12H   Or  . LORazepam  0-4 mg Oral Q12H  . magnesium oxide  400 mg Oral BID  . multivitamin with minerals  1 tablet Oral Daily  . pantoprazole  40 mg Oral Daily  . potassium chloride  20 mEq Oral BID   Continuous Infusions: . sodium chloride 250 mL (01/15/21 0134)  . sodium phosphate  Dextrose 5% IVPB    . thiamine injection 500 mg (01/15/21 0847)     LOS: 4 days    Time spent: 25 min    Silvano BilisNoah B Wendell Nicoson, MD [facility]   If 7PM-7AM, please contact night-coverage [email or web address] Password [email or web address] 01/15/2021, 11:07 AM

## 2021-01-16 LAB — COMPREHENSIVE METABOLIC PANEL
ALT: 97 U/L — ABNORMAL HIGH (ref 0–44)
AST: 238 U/L — ABNORMAL HIGH (ref 15–41)
Albumin: 2.8 g/dL — ABNORMAL LOW (ref 3.5–5.0)
Alkaline Phosphatase: 343 U/L — ABNORMAL HIGH (ref 38–126)
Anion gap: 8 (ref 5–15)
BUN: <5 mg/dL — ABNORMAL LOW (ref 6–20)
CO2: 23 mmol/L (ref 22–32)
Calcium: 7.7 mg/dL — ABNORMAL LOW (ref 8.9–10.3)
Chloride: 102 mmol/L (ref 98–111)
Creatinine, Ser: 0.58 mg/dL — ABNORMAL LOW (ref 0.61–1.24)
GFR, Estimated: >60 mL/min (ref >60)
Glucose, Bld: 93 mg/dL (ref 70–99)
Potassium: 3.7 mmol/L (ref 3.5–5.1)
Sodium: 133 mmol/L — ABNORMAL LOW (ref 135–145)
Total Bilirubin: 9.1 mg/dL — ABNORMAL HIGH (ref 0.3–1.2)
Total Protein: 5.9 g/dL — ABNORMAL LOW (ref 6.5–8.1)

## 2021-01-16 LAB — CBC
HCT: 22.6 % — ABNORMAL LOW (ref 39.0–52.0)
Hemoglobin: 8 g/dL — ABNORMAL LOW (ref 13.0–17.0)
MCH: 39.8 pg — ABNORMAL HIGH (ref 26.0–34.0)
MCHC: 35.4 g/dL (ref 30.0–36.0)
MCV: 112.4 fL — ABNORMAL HIGH (ref 80.0–100.0)
Platelets: 83 10*3/uL — ABNORMAL LOW (ref 150–400)
RBC: 2.01 MIL/uL — ABNORMAL LOW (ref 4.22–5.81)
RDW: 19.4 % — ABNORMAL HIGH (ref 11.5–15.5)
WBC: 6.4 10*3/uL (ref 4.0–10.5)
nRBC: 1.2 % — ABNORMAL HIGH (ref 0.0–0.2)

## 2021-01-16 LAB — PROTIME-INR
INR: 1.3 — ABNORMAL HIGH (ref 0.8–1.2)
Prothrombin Time: 16.5 seconds — ABNORMAL HIGH (ref 11.4–15.2)

## 2021-01-16 LAB — MAGNESIUM: Magnesium: 1.8 mg/dL (ref 1.7–2.4)

## 2021-01-16 LAB — PHOSPHORUS: Phosphorus: 2.6 mg/dL (ref 2.5–4.6)

## 2021-01-16 MED ORDER — POTASSIUM CHLORIDE CRYS ER 20 MEQ PO TBCR
20.0000 meq | EXTENDED_RELEASE_TABLET | Freq: Once | ORAL | Status: AC
  Administered 2021-01-16: 20 meq via ORAL
  Filled 2021-01-16: qty 1

## 2021-01-16 MED ORDER — MAGNESIUM SULFATE 2 GM/50ML IV SOLN
2.0000 g | Freq: Once | INTRAVENOUS | Status: AC
  Administered 2021-01-16: 2 g via INTRAVENOUS
  Filled 2021-01-16: qty 50

## 2021-01-16 NOTE — Progress Notes (Signed)
Physical Therapy Treatment Patient Details Name: Joshua Medina MRN: [id number] DOB: February 22, 1986 Today's Date: 01/16/2021    History of Present Illness Pt is a 35 y.o. male with medical history significant of malnutrition, hypothyroidism, alcohol liver disease, alcohol withdrawal seizure, delirium presented with seizure, altered mental status.  MD assessment includes: Acute encephalopathy likely multifactorial, postictal status alcohol withdrawal seizure versus hepatic encephalopathy, alcohol abuse/withdrawal, alcoholic liver disease, liver cirrhosis, thrombocytopenia, hyponatremia, elevated ammonia, elevated lipase, hypomagnesemia, hypokalemia, and anemia.    PT Comments    Pt trying to get up to bathroom upon arrival at end of bed on right,  2 nurses in trying to redirect pt.  He was able to lay back down and exit left side of bed with supervision to EOB.  Stood with mod a x 1 and transferred to commode brought to bedside.  Inc BM and had a very large soft BM once up.  Stood with mod a x 1 for care and transferred back to bed where he participated in standing ex's at bedside.  Pt with post LOB and general poor awareness and does not self correct but relies on me to prevent fall and cues to correct.  Generally fatigued with activity and asks to return to supine.    Pt with no c/o dizziness with activity today.  Does report feeling "disorientated".  Planned to do orthostatics but given urgency for BM it was deferred.   Follow Up Recommendations  SNF;Supervision/Assistance - 24 hour     Equipment Recommendations  None recommended by PT    Recommendations for Other Services       Precautions / Restrictions Precautions Precautions: Fall;Other (comment) Restrictions Weight Bearing Restrictions: No Other Position/Activity Restrictions: Seizure precautions    Mobility  Bed Mobility Overal bed mobility: Needs Assistance Bed Mobility: Supine to Sit;Sit to Supine     Supine to sit:  Supervision Sit to supine: Supervision        Transfers Overall transfer level: Needs assistance Equipment used: Rolling walker (2 wheeled) Transfers: Sit to/from Stand Sit to Stand: Min assist;Mod assist            Ambulation/Gait Ambulation/Gait assistance: Min assist;Mod assist Gait Distance (Feet): 3 Feet Assistive device: Rolling walker (2 wheeled) Gait Pattern/deviations: Step-to pattern;Shuffle Gait velocity: decreased   General Gait Details: post lean   Stairs             Wheelchair Mobility    Modified Rankin (Stroke Patients Only)       Balance Overall balance assessment: Needs assistance Sitting-balance support: Bilateral upper extremity supported;Feet supported Sitting balance-Leahy Scale: Fair     Standing balance support: Bilateral upper extremity supported;During functional activity Standing balance-Leahy Scale: Poor Standing balance comment: general lack of awareness                            Cognition Arousal/Alertness: Lethargic Behavior During Therapy: Flat affect Overall Cognitive Status: No family/caregiver present to determine baseline cognitive functioning                                        Exercises      General Comments        Pertinent Vitals/Pain Pain Assessment: No/denies pain    Home Living  Prior Function            PT Goals (current goals can now be found in the care plan section) Progress towards PT goals: Progressing toward goals    Frequency    Min 2X/week      PT Plan      Co-evaluation              AM-PAC PT "6 Clicks" Mobility   Outcome Measure  Help needed turning from your back to your side while in a flat bed without using bedrails?: None Help needed moving from lying on your back to sitting on the side of a flat bed without using bedrails?: A Little Help needed moving to and from a bed to a chair (including a  wheelchair)?: A Lot Help needed standing up from a chair using your arms (e.g., wheelchair or bedside chair)?: A Lot Help needed to walk in hospital room?: A Lot Help needed climbing 3-5 steps with a railing? : Total 6 Click Score: 14    End of Session Equipment Utilized During Treatment: Gait belt Activity Tolerance: Patient tolerated treatment well Patient left: in bed;with nursing/sitter in room;Other (comment) (Pt left with nursing for hygiene) Nurse Communication: Mobility status PT Visit Diagnosis: Unsteadiness on feet (R26.81);History of falling (Z91.81);Repeated falls (R29.6);Difficulty in walking, not elsewhere classified (R26.2);Muscle weakness (generalized) (M62.81)     Time: 1017-5102 PT Time Calculation (min) (ACUTE ONLY): 23 min  Charges:  $Therapeutic Exercise: 8-22 mins $Therapeutic Activity: 8-22 mins                    Danielle Dess, PTA 01/16/21, 4:12 PM

## 2021-01-16 NOTE — Progress Notes (Signed)
PROGRESS NOTE    Joshua Medina  EHU:[id number] DOB: [birth year] DOA: 01/11/2021 PCP: Pcp, No  Outpatient Specialists: none    Brief Narrative:    Joshua Medina is a 35 y.o. male with medical history significant of malnutrition, hypothyroidism, alcohol liver disease, alcohol withdrawal seizure, delirium presented with seizure, altered mental status  When I saw patient, patient is lethargic, not good historian, oriented to time and people, he thought he is in [place], he could not recall seizure episode, does report last drinking 2 days ago, reported to have some loose stool yesterday, otherwise patient reports no  fever, chills, chest pain, shortness of breath, cough, abdominal pain,  dysuria, leg swelling.  Per Chart review, EMS was reported from family that patient may had a seizure, EMS found the patient has been shaking/tremors,  patient was sent to the ED   ED Course: pt was found to have   temperature 97.6, heart rate 113-122, respiratory 16, blood pressure 101/82, oxygen saturation 100% on room air.  Labs show sodium sodium 128, chloride 79, bicarb 12 glucose 115, iron 77, lipase 1100 AST 412,  ALT 103, ammonia 97, total bilirubin 12.2, hemoglobin 9.4, platelets 37, INR 1.5, acetaminophen level less than 10, influenza negative/negative, alcohol level less than 10 CT head: No evidence of acute intracranial abnormality. Age advanced cerebral and cerebellar atrophy. Mild right ethmoid sinusitis.  In ED patient was give folic acid, thiamine,  lactulose, Ativan Hospitalist was requested to admit patient   Assessment & Plan:   Active Problems:   Hypothyroidism (acquired)   Alcoholic liver disease (HCC)   Alcohol withdrawal seizure (HCC)   Altered mental status   Hyponatremia   # Acute encephalopathy  Likely multifactorial, postictal status alcohol withdrawal seizure versus? Hepatic encephalopathy. Improving - close monitoring  # Depressed mood Recent death of wife -  psychiatry to see today  # Debility PT advises SNF, pt has refused - likely home with home health pt/ot/aide, will communicate that to care mgmt  #Alcohol abuse, alcohol withdrawal seizure: Patient's seizure is most likely due to alcohol withdrawal seizure.  Patient is a heavy drinker and had hx of withdrawal seizures. Last drink 2 days prior to admit. Ct-head is negative for acute intracranial abnormalities. No focal neurological findings on physical examination. Required precedex, pressors in icu last hospitalization. Pt's wife recently died of alcoholism - CIWA protocol, last ativan was 5/27 - Thiamine & Folate, have started wernicke's treatment w/ thiamine 500 tid for 3 days (started 5/27) - off ifv - Seizure precautions  #Alcoholic liver disease,?  Liver cirrhosis. Thrombocytopenia MEL D sodium score 26, 90 days mortality 14-15% Maddrey'ss discrimination function 24.2, no indication for steroids. Plts improving - monitor - protonix 40 qd ppx - s/p vitamin k  # hyponatremia:  Mild, stable. Likely 2/2 liver disease, malnutrition  #Elevated ammonia,?  Hepatic encephalopathy: Likely chronic, 96 on 06/14/1999 - lactulose bid  #elevated lipase:  CT neg, asymptomatic, likely 2/2 liver disease - monitor  #Hypothyroidism: TSH elevated - resumed home levothyroxine  # Hypomagenesemia # Hypophosphatemia # Hypokalemia Much improved today - replenishment per pharm  # Anemia  H 8, no signs bleeding, stable - monitor   DVT prophylaxis: scds (thrombocytopenia) Code Status: full Family Communication: mother updated telephonically 5/27  Level of care: Progressive Cardiac Status is: Inpatient  Remains inpatient appropriate because:Inpatient level of care appropriate due to severity of illness   Dispo: The patient is from: Home  Anticipated d/c is to: tbd              Patient currently is not medically stable to d/c.   Difficult to place patient  No        Consultants:  none  Procedures: none  Antimicrobials:  none    Subjective: This morning awake. Speech slurred, no pain, no n/v/d. Tolerating diet. Tremor resolved  Objective: Vitals:   01/15/21 1224 01/15/21 2125 01/16/21 0543 01/16/21 0750  BP: (!) 92/52 (!) 89/64 91/65 (!) 89/68  Pulse: (!) 109 93 92 98  Resp: 18 16 16 16   Temp: 97.8 F (36.6 C) 97.9 F (36.6 C) 99 F (37.2 C) 98.8 F (37.1 C)  TempSrc:  Oral Oral Oral  SpO2: 98% 99% 100% 98%  Weight:      Height:        Intake/Output Summary (Last 24 hours) at 01/16/2021 1004 Last data filed at 01/16/2021 0530 Gross per 24 hour  Intake 380.38 ml  Output 2100 ml  Net -1719.62 ml   Filed Weights   01/11/21 1634  Weight: 63 kg    Examination:  General: chronically ill appearing, dissheveled HEENT:atraumatic, mmm Heme: No neck lymph node enlargement. Cardiac: S1/S2, RRR, No murmurs, No gallops or rubs. Respiratory: Good air movement bilaterally. No rales, wheezing, rhonchi or rubs. GI: Soft, nondistended, nontender, no rebound pain, no organomegaly, BS present. GU: No hematuria Ext: No pitting leg edema bilaterally. 2+DP/PT pulse bilaterally. Musculoskeletal: No joint deformities, No joint redness or warmth, no limitation of ROM in spin. Skin: No rashes. jaundiced Neuro: Lethargic,  oriented X2, freely move 4 extremities, no focal neurodeficit   Data Reviewed: I have personally reviewed following labs and imaging studies  CBC: Recent Labs  Lab 01/11/21 1633 01/12/21 0321 01/13/21 0445 01/14/21 0334 01/15/21 0526 01/16/21 0437  WBC 9.8 8.2 7.0 5.9 6.1 6.4  NEUTROABS 7.9*  --   --   --   --   --   HGB 9.4* 7.4* 7.4* 7.2* 7.7* 8.0*  HCT 26.4* 20.6* 21.0* 21.1* 22.4* 22.6*  MCV 106.0* 104.0* 106.1* 109.9* 110.3* 112.4*  PLT 37* 36* 43* 53* 60* 83*   Basic Metabolic Panel: Recent Labs  Lab 01/12/21 0321 01/12/21 1446 01/13/21 0445 01/14/21 0334 01/15/21 0526 01/16/21 0437   NA 130* 133* 135 133* 133* 133*  K 2.7* 2.9* 3.1* 3.3* 4.1 3.7  CL 88* 93* 98 100 101 102  CO2 27 26 27 24 23 23   GLUCOSE 242* 166* 121* 107* 109* 93  BUN 8 7 5* <5* <5* <5*  CREATININE 0.68 0.47* 0.47* 0.48* 0.56* 0.58*  CALCIUM 7.0* 7.0* 6.9* 6.9* 7.3* 7.7*  MG 1.7  --  1.7 1.8 1.7 1.8  PHOS 1.0*  --  <1.0* 1.4* 1.7* 2.6   GFR: Estimated Creatinine Clearance: 113.8 mL/min (A) (by C-G formula based on SCr of 0.58 mg/dL (L)). Liver Function Tests: Recent Labs  Lab 01/12/21 1446 01/13/21 0445 01/14/21 0334 01/15/21 0526 01/16/21 0437  AST 324* 353* 323* 280* 238*  ALT 90* 99* 105* 103* 97*  ALKPHOS 293* 296* 306* 334* 343*  BILITOT 10.8* 11.0* 10.6* 10.1* 9.1*  PROT 5.9* 5.9* 5.7* 5.6* 5.9*  ALBUMIN 3.0* 3.1* 2.9* 2.8* 2.8*   Recent Labs  Lab 01/11/21 1633 01/12/21 0321  LIPASE 1,110* 650*  AMYLASE  --  256*   Recent Labs  Lab 01/11/21 1633 01/12/21 0321  AMMONIA 97* 37*   Coagulation Profile: Recent Labs  Lab 01/11/21 1633 01/13/21  2025 01/14/21 0334 01/15/21 0526 01/16/21 0437  INR 1.5* 1.5* 1.3* 1.3* 1.3*   Cardiac Enzymes: No results for input(s): CKTOTAL, CKMB, CKMBINDEX, TROPONINI in the last 168 hours. BNP (last 3 results) No results for input(s): PROBNP in the last 8760 hours. HbA1C: No results for input(s): HGBA1C in the last 72 hours. CBG: No results for input(s): GLUCAP in the last 168 hours. Lipid Profile: No results for input(s): CHOL, HDL, LDLCALC, TRIG, CHOLHDL, LDLDIRECT in the last 72 hours. Thyroid Function Tests: No results for input(s): TSH, T4TOTAL, FREET4, T3FREE, THYROIDAB in the last 72 hours. Anemia Panel: No results for input(s): VITAMINB12, FOLATE, FERRITIN, TIBC, IRON, RETICCTPCT in the last 72 hours. Urine analysis:    Component Value Date/Time   COLORURINE AMBER (A) 02/16/2020 0446   APPEARANCEUR HAZY (A) 02/16/2020 0446   LABSPEC 1.011 02/16/2020 0446   PHURINE 7.0 02/16/2020 0446   GLUCOSEU NEGATIVE 02/16/2020  0446   HGBUR NEGATIVE 02/16/2020 0446   BILIRUBINUR SMALL (A) 02/16/2020 0446   KETONESUR NEGATIVE 02/16/2020 0446   PROTEINUR 100 (A) 02/16/2020 0446   NITRITE NEGATIVE 02/16/2020 0446   LEUKOCYTESUR NEGATIVE 02/16/2020 0446   Sepsis Labs: @LABRCNTIP (procalcitonin:4,lacticidven:4)  ) Recent Results (from the past 240 hour(s))  Resp Panel by RT-PCR (Flu A&B, Covid) Nasopharyngeal Swab     Status: None   Collection Time: 01/11/21  4:33 PM   Specimen: Nasopharyngeal Swab; Nasopharyngeal(NP) swabs in vial transport medium  Result Value Ref Range Status   SARS Coronavirus 2 by RT PCR NEGATIVE NEGATIVE Final    Comment: (NOTE) SARS-CoV-2 target nucleic acids are NOT DETECTED.  The SARS-CoV-2 RNA is generally detectable in upper respiratory specimens during the acute phase of infection. The lowest concentration of SARS-CoV-2 viral copies this assay can detect is 138 copies/mL. A negative result does not preclude SARS-Cov-2 infection and should not be used as the sole basis for treatment or other patient management decisions. A negative result may occur with  improper specimen collection/handling, submission of specimen other than nasopharyngeal swab, presence of viral mutation(s) within the areas targeted by this assay, and inadequate number of viral copies(<138 copies/mL). A negative result must be combined with clinical observations, patient history, and epidemiological information. The expected result is Negative.  Fact Sheet for Patients:  01/13/21  Fact Sheet for Healthcare Providers:  BloggerCourse.com  This test is no t yet approved or cleared by the [employer] FDA and  has been authorized for detection and/or diagnosis of SARS-CoV-2 by FDA under an Emergency Use Authorization (EUA). This EUA will remain  in effect (meaning this test can be used) for the duration of the COVID-19 declaration under Section  564(b)(1) of the Act, 21 U.S.C.section 360bbb-3(b)(1), unless the authorization is terminated  or revoked sooner.       Influenza A by PCR NEGATIVE NEGATIVE Final   Influenza B by PCR NEGATIVE NEGATIVE Final    Comment: (NOTE) The Xpert Xpress SARS-CoV-2/FLU/RSV plus assay is intended as an aid in the diagnosis of influenza from Nasopharyngeal swab specimens and should not be used as a sole basis for treatment. Nasal washings and aspirates are unacceptable for Xpert Xpress SARS-CoV-2/FLU/RSV testing.  Fact Sheet for Patients: [place]  Fact Sheet for Healthcare Providers: BloggerCourse.com  This test is not yet approved or cleared by the [employer] FDA and has been authorized for detection and/or diagnosis of SARS-CoV-2 by FDA under an Emergency Use Authorization (EUA). This EUA will remain in effect (meaning this test can be used) for the  duration of the COVID-19 declaration under Section 564(b)(1) of the Act, 21 U.S.C. section 360bbb-3(b)(1), unless the authorization is terminated or revoked.  Performed at [facility], [address] [place], [place] [address]          Radiology Studies: No results found.      Scheduled Meds: . feeding supplement  237 mL Oral BID BM  . folic acid  1 mg Oral Daily  . lactulose  20 g Oral BID  . levothyroxine  100 mcg Oral Q0600  . magnesium oxide  400 mg Oral BID  . multivitamin with minerals  1 tablet Oral Daily  . pantoprazole  40 mg Oral Daily  . potassium chloride  20 mEq Oral BID   Continuous Infusions: . sodium chloride Stopped (01/15/21 0445)  . thiamine injection 500 mg (01/16/21 0837)     LOS: 5 days    Time spent: 25 min    Silvano BilisNoah B Dashonna Chagnon, MD [facility]   If 7PM-7AM, please contact night-coverage [email or web address] Password [email or web address] 01/16/2021, 10:04 AM

## 2021-01-16 NOTE — Consult Note (Signed)
PHARMACY CONSULT NOTE - FOLLOW UP  Pharmacy Consult for Electrolyte Monitoring and Replacement   Recent Labs: Potassium (mmol/L)  Date Value  01/16/2021 3.7   Magnesium (mg/dL)  Date Value  43/56/8616 1.8   Calcium (mg/dL)  Date Value  83/72/9021 7.7 (L)   Albumin (g/dL)  Date Value  11/55/2080 2.8 (L)   Phosphorus (mg/dL)  Date Value  22/33/6122 2.6   Sodium (mmol/L)  Date Value  01/16/2021 133 (L)     Assessment: 34 y.o.malewith medical history significant ofmalnutrition, hypothyroidism, alcohol liver disease, alcohol withdrawal seizure, delirium presented with seizure, altered mental status  Goal of Therapy:  WNL  Plan:  K 3.7  Mag 1.8  Phos 2.6  Scr 0.58 -Patient on Mag OX 400mg  bid and KCl 20 mEq BID. - will order Magnesium 2 g IV x 1.  -will order KCL 20 meq po x1 this am 5/29 F/u with AM labs.   6/29 ,PharmD Clinical Pharmacist 01/16/2021 8:46 AM

## 2021-01-16 NOTE — Consult Note (Signed)
[facility] Face-to-Face Psychiatry Consult   Reason for Consult:  Alcohol use disorder, depression Referring Physician:  Dr. Ashok Pall Patient Identification: Joshua Medina MRN:  [id number] Principal Diagnosis: <principal problem not specified> Diagnosis:  Active Problems:   Hypothyroidism (acquired)   Alcoholic liver disease (HCC)   Alcohol withdrawal seizure (HCC)   Altered mental status   Hyponatremia   Total Time spent with patient: 30 minutes  Subjective:   Joshua Medina is a 35 y.o. male patient with a history of severe alcohol use disorder, alcohol withdrawal seizures, alcohol liver disease, hypothyroidism, admitted with seizure and altered mental status.  Psychiatric consultation requested for alcohol use disorder and depression.  HPI: Patient presented to ED from home after a reported episode of seizure-like activity. Ed labwork show electrolyte abnormalities (sodium 128, chloride 79, bicarb 12, iron 77), lipase 1100, abnormal liver function (AST 412,  ALT 103, ammonia 97, total bilirubin 12.2, hemoglobin 9.4, platelets 37, INR 1.5), alcohol level less than 10. CT head: No evidence of acute intracranial abnormality. Last CIWA score was 3. Patient reports feeling less confused than on admission. He is oriented in self, place "[facility]", correct mont, not date "it is close to my birthday". He knows he had a seizure. He reports history of alcohol withdrawal seizures, alcoholic hallucinosis, delirium tremens in the past. He reports his last drink was "at least three days ago". He reports alcohol is the only substance he abuses. Reports drinking "a lot and often". Reports his longest sobriety was seven years when he was incarcerated. Reports several past detox and rehab experiences, states he is not interested in one now. He realizes his drinking has a main negative impact on his general health and mental health, although states he does not believe in rehab "I`ve been there before, it made no  difference at the end". He states he is not interested in outpatient substance abuse counseling as well. Currently identifies his mood as "probably depressed" and discloses recent death of wife, admits to feeling feeling sad, unmotivated, has diminished interest or pleasure in activities, low self-esteem. He denies feeling hopeless, helpless. He denies passive death wishes. He denies having any suicidal thoughts - "I don`t want to hurt self or others. I don`t think I will ever hurt self or somebody". He denies past suicidal attempts. Patient is willing to try a medication for depression.  He denies anxiety, denies panic attacks. Patient denies any current or past symptoms of mania, such as increased energy, feeling irritable, easily distractible, unusual talkativeness. Denies decreased need for sleep. He denies any current hallucinations. Reports having alcohol withdrawal-related hallucinations in the past. Patient does not express any delusions. Patient reports feeling safe in their environment.  Past Psychiatric History:  Denies past psychiatric diagnoses, inpatient psych admission, suicidal attempts, having an outpatient psychiatrist, ever taking psych medications.  Past Medical History:  Past Medical History:  Diagnosis Date  . Graves disease   . Thyroid disease     Past Surgical History:  Procedure Laterality Date  . thyroid ablation with radioactive I-131     Family History:  Family History  Problem Relation Age of Onset  . Diabetes Paternal Grandmother    Family Psychiatric  History: unknown Social History:  Social History   Substance and Sexual Activity  Alcohol Use Yes     Social History   Substance and Sexual Activity  Drug Use Never    Social History   Socioeconomic History  . Marital status: Single    Spouse name: Not  on file  . Number of children: Not on file  . Years of education: Not on file  . Highest education level: Not on file  Occupational History  .  Not on file  Tobacco Use  . Smoking status: Current Every Day Smoker    Types: Cigarettes  . Smokeless tobacco: Never Used  Vaping Use  . Vaping Use: Never used  Substance and Sexual Activity  . Alcohol use: Yes  . Drug use: Never  . Sexual activity: Never  Other Topics Concern  . Not on file  Social History Narrative  . Not on file   Social Determinants of Health   Financial Resource Strain: Not on file  Food Insecurity: Not on file  Transportation Needs: Not on file  Physical Activity: Not on file  Stress: Not on file  Social Connections: Not on file   Additional Social History:    Allergies:   Allergies  Allergen Reactions  . Precedex [Dexmedetomidine Hcl In Nacl] Other (See Comments)    Labs:  Results for orders placed or performed during the hospital encounter of 01/11/21 (from the past 48 hour(s))  Magnesium     Status: None   Collection Time: 01/15/21  5:26 AM  Result Value Ref Range   Magnesium 1.7 1.7 - 2.4 mg/dL    Comment: Performed at [facility], [address] [place], [place] [address]  Phosphorus     Status: Abnormal   Collection Time: 01/15/21  5:26 AM  Result Value Ref Range   Phosphorus 1.7 (L) 2.5 - 4.6 mg/dL    Comment: ICTERUS AT THIS LEVEL MAY AFFECT RESULT Performed at [facility], [address] [place], [place] [address]   Comprehensive metabolic panel     Status: Abnormal   Collection Time: 01/15/21  5:26 AM  Result Value Ref Range   Sodium 133 (L) 135 - 145 mmol/L   Potassium 4.1 3.5 - 5.1 mmol/L   Chloride 101 98 - 111 mmol/L   CO2 23 22 - 32 mmol/L   Glucose, Bld 109 (H) 70 - 99 mg/dL    Comment: Glucose reference range applies only to samples taken after fasting for at least 8 hours.   BUN <5 (L) 6 - 20 mg/dL   Creatinine, Ser 9.50 (L) 0.61 - 1.24 mg/dL   Calcium 7.3 (L) 8.9 - 10.3 mg/dL   Total Protein 5.6 (L) 6.5 - 8.1 g/dL   Albumin 2.8 (L) 3.5 - 5.0 g/dL   AST 932 (H) 15 - 41 U/L   ALT 103 (H) 0  - 44 U/L   Alkaline Phosphatase 334 (H) 38 - 126 U/L   Total Bilirubin 10.1 (H) 0.3 - 1.2 mg/dL   GFR, Estimated >67 >12 mL/min    Comment: (NOTE) Calculated using the CKD-EPI Creatinine Equation (2021)    Anion gap 9 5 - 15    Comment: Performed at [facility], [address] [place], [place] [address]  Protime-INR     Status: Abnormal   Collection Time: 01/15/21  5:26 AM  Result Value Ref Range   Prothrombin Time 16.4 (H) 11.4 - 15.2 seconds   INR 1.3 (H) 0.8 - 1.2    Comment: (NOTE) INR goal varies based on device and disease states. Performed at [facility], [address] [place], [place] [address]   CBC     Status: Abnormal   Collection Time: 01/15/21  5:26 AM  Result Value Ref Range   WBC 6.1 4.0 -  10.5 K/uL   RBC 2.03 (L) 4.22 - 5.81 MIL/uL   Hemoglobin 7.7 (L) 13.0 - 17.0 g/dL   HCT 16.1 (L) 09.6 - 04.5 %   MCV 110.3 (H) 80.0 - 100.0 fL   MCH 37.9 (H) 26.0 - 34.0 pg   MCHC 34.4 30.0 - 36.0 g/dL   RDW 40.9 (H) 81.1 - 91.4 %   Platelets 60 (L) 150 - 400 K/uL    Comment: Immature Platelet Fraction may be clinically indicated, consider ordering this additional test [id number]    nRBC 2.1 (H) 0.0 - 0.2 %    Comment: Performed at [facility], [address] [place], [place] [address]  Magnesium     Status: None   Collection Time: 01/16/21  4:37 AM  Result Value Ref Range   Magnesium 1.8 1.7 - 2.4 mg/dL    Comment: Performed at [facility], [address] [place], [place] [address]  Phosphorus     Status: None   Collection Time: 01/16/21  4:37 AM  Result Value Ref Range   Phosphorus 2.6 2.5 - 4.6 mg/dL    Comment: ICTERUS AT THIS LEVEL MAY AFFECT RESULT Performed at [facility], [address] [place], [place] [address]   Comprehensive metabolic panel     Status: Abnormal   Collection Time: 01/16/21  4:37 AM  Result Value Ref Range   Sodium 133 (L) 135 - 145 mmol/L   Potassium 3.7 3.5 - 5.1 mmol/L    Chloride 102 98 - 111 mmol/L   CO2 23 22 - 32 mmol/L   Glucose, Bld 93 70 - 99 mg/dL    Comment: Glucose reference range applies only to samples taken after fasting for at least 8 hours.   BUN <5 (L) 6 - 20 mg/dL   Creatinine, Ser 8.41 (L) 0.61 - 1.24 mg/dL   Calcium 7.7 (L) 8.9 - 10.3 mg/dL   Total Protein 5.9 (L) 6.5 - 8.1 g/dL   Albumin 2.8 (L) 3.5 - 5.0 g/dL   AST 324 (H) 15 - 41 U/L   ALT 97 (H) 0 - 44 U/L   Alkaline Phosphatase 343 (H) 38 - 126 U/L   Total Bilirubin 9.1 (H) 0.3 - 1.2 mg/dL   GFR, Estimated >40 >10 mL/min    Comment: (NOTE) Calculated using the CKD-EPI Creatinine Equation (2021)    Anion gap 8 5 - 15    Comment: Performed at [facility], [address] [place], [place] [address]  Protime-INR     Status: Abnormal   Collection Time: 01/16/21  4:37 AM  Result Value Ref Range   Prothrombin Time 16.5 (H) 11.4 - 15.2 seconds   INR 1.3 (H) 0.8 - 1.2    Comment: (NOTE) INR goal varies based on device and disease states. Performed at [facility], [address] [place], [place] [address]   CBC     Status: Abnormal   Collection Time: 01/16/21  4:37 AM  Result Value Ref Range   WBC 6.4 4.0 - 10.5 K/uL   RBC 2.01 (L) 4.22 - 5.81 MIL/uL   Hemoglobin 8.0 (L) 13.0 - 17.0 g/dL   HCT 34.7 (L) 42.5 - 95.6 %   MCV 112.4 (H) 80.0 - 100.0 fL   MCH 39.8 (H) 26.0 - 34.0 pg   MCHC 35.4 30.0 - 36.0 g/dL   RDW 38.7 (H) 56.4 - 33.2 %   Platelets 83 (L) 150 - 400 K/uL    Comment: Immature Platelet  Fraction may be clinically indicated, consider ordering this additional test [id number]    nRBC 1.2 (H) 0.0 - 0.2 %    Comment: Performed at [facility], [address] [place], [place] [address]    Current Facility-Administered Medications  Medication Dose Route Frequency Provider Last Rate Last Admin  . 0.9 %  sodium chloride infusion   Intravenous PRN Kathrynn Running, MD   Stopped at 01/15/21 [phone]  . feeding supplement (ENSURE ENLIVE /  ENSURE PLUS) liquid 237 mL  237 mL Oral BID BM Wouk, Wilfred Curtis, MD   237 mL at 01/16/21 0839  . folic acid (FOLVITE) tablet 1 mg  1 mg Oral Daily Rayne Du, MD   1 mg at 01/16/21 2458  . lactulose (CHRONULAC) 10 GM/15ML solution 20 g  20 g Oral BID Kathrynn Running, MD   20 g at 01/16/21 [phone]  . levothyroxine (SYNTHROID) tablet 100 mcg  100 mcg Oral [id number] Rayne Du, MD   100 mcg at 01/16/21 0658  . magnesium oxide (MAG-OX) tablet 400 mg  400 mg Oral BID Rayne Du, MD   400 mg at 01/16/21 0539  . multivitamin with minerals tablet 1 tablet  1 tablet Oral Daily Rayne Du, MD   1 tablet at 01/16/21 [phone]  . pantoprazole (PROTONIX) EC tablet 40 mg  40 mg Oral Daily Kathrynn Running, MD   40 mg at 01/16/21 [phone]  . potassium chloride SA (KLOR-CON) CR tablet 20 mEq  20 mEq Oral BID Kathrynn Running, MD   20 mEq at 01/16/21 0834  . thiamine 500mg  in normal saline (21ml) IVPB  500 mg Intravenous TID 45m, MD 100 mL/hr at 01/16/21 0837 500 mg at 01/16/21 01/18/21     Psychiatric Specialty Exam: Appearance:  CM, appearing stated age, wearing hospital clothes. Decreased level of alertness, somewhat confused and disoriented.  Attitude/Behavior: calm, cooperative, engaging.  Motor: dyskinesias not evident.  Speech: spontaneous, clear, coherent, normal comprehension.  Mood: "probably depressed".  Affect: blunted, flat.  Thought process: patient appears coherent, organized, logical, goal-directed.  Thought content: patient denies suicidal thoughts, denies homicidal thoughts; did not express any delusions.  Thought perception: patient denies auditory and visual hallucinations. Did not appear internally stimulated.  Cognition: patient is alert and oriented in self, place and situation.  Insight: fair.  Judgement: limited, in regards of ability to make good decisions concerning the appropriate thing to do in various situations, including ability to form opinions  regarding their substance use, mental health condition.   Physical Exam: Physical Exam ROS Blood pressure (!) 89/68, pulse 98, temperature 98.8 F (37.1 C), temperature source Oral, resp. rate 16, height 5\' 8"  (1.727 m), weight 63 kg, SpO2 98 %. Body mass index is 21.12 kg/m.  Treatment Plan Summary: ASSESSMENT: 35yo M patient with a history of severe alcohol use disorder, alcohol withdrawal seizures, alcohol liver disease, hypothyroidism, admitted with seizure and altered mental status. Psychiatric consultation requested for alcohol use disorder and depression. During my assessment, pt is calm, mostly-cooperative, mostly logical, still confused somewhat. Patient reports depressed mood and is interested for medications for depression. He denies suicidal, homicidal thoughts, current hallucinations and paranoia; patient does not appear to be psychotic, gravely disabled or at acute risk of harming self or others. Patient does not meet criteria for an inpatient psychiatric admission at this time. Patient`s substance use seems to be his chronic condition for which an acute hospitalization may not be of much benefit, the least restrictive  setting for him would be to follow up outpatient to mitigate and treat his chronic condition. Patient reports his last drink was 3 days ago, so he is in the time window when DT typically develops.   IMPRESSION: Alcohol use disorder, severe. Acute alcohol withdrawal; s/p withdrawal seizure. Acute encephalopathy Unspecified depressive disorder  RECOMMENDATIONS: -No indication for inpatient psychiatric hospitalization. -Continue CIWA protocol, thiamine, folate, multivitamin. -Patient is interested in starting a medication for depression. I would wait with that now while the patient is medically unstable and is having ongoing electrolyte abnormalities, thrombocytopenia with high bleeding risk, and severe hepatic impairment.  -Unit SW please provide the patient with  a list of outpatient MH and SUD resources -Psychiatry will follow as needed.  Disposition: No evidence of imminent risk to self or others at present.   Patient does not meet criteria for psychiatric inpatient admission. Supportive therapy provided about ongoing stressors. Discussed crisis plan, support from social network, calling 911, coming to the Emergency Department, and calling Suicide Hotline.  Thalia PartyAlisa Toua Stites, MD 01/16/2021 11:43 AM

## 2021-01-17 LAB — COMPREHENSIVE METABOLIC PANEL
ALT: 86 U/L — ABNORMAL HIGH (ref 0–44)
AST: 191 U/L — ABNORMAL HIGH (ref 15–41)
Albumin: 2.8 g/dL — ABNORMAL LOW (ref 3.5–5.0)
Alkaline Phosphatase: 354 U/L — ABNORMAL HIGH (ref 38–126)
Anion gap: 8 (ref 5–15)
BUN: 5 mg/dL — ABNORMAL LOW (ref 6–20)
CO2: 23 mmol/L (ref 22–32)
Calcium: 8.6 mg/dL — ABNORMAL LOW (ref 8.9–10.3)
Chloride: 100 mmol/L (ref 98–111)
Creatinine, Ser: 0.56 mg/dL — ABNORMAL LOW (ref 0.61–1.24)
GFR, Estimated: >60 mL/min (ref >60)
Glucose, Bld: 110 mg/dL — ABNORMAL HIGH (ref 70–99)
Potassium: 3.6 mmol/L (ref 3.5–5.1)
Sodium: 131 mmol/L — ABNORMAL LOW (ref 135–145)
Total Bilirubin: 8.7 mg/dL — ABNORMAL HIGH (ref 0.3–1.2)
Total Protein: 5.8 g/dL — ABNORMAL LOW (ref 6.5–8.1)

## 2021-01-17 LAB — PROTIME-INR
INR: 1.3 — ABNORMAL HIGH (ref 0.8–1.2)
Prothrombin Time: 16.3 seconds — ABNORMAL HIGH (ref 11.4–15.2)

## 2021-01-17 LAB — CBC
HCT: 23.3 % — ABNORMAL LOW (ref 39.0–52.0)
Hemoglobin: 8 g/dL — ABNORMAL LOW (ref 13.0–17.0)
MCH: 37.7 pg — ABNORMAL HIGH (ref 26.0–34.0)
MCHC: 34.3 g/dL (ref 30.0–36.0)
MCV: 109.9 fL — ABNORMAL HIGH (ref 80.0–100.0)
Platelets: 111 10*3/uL — ABNORMAL LOW (ref 150–400)
RBC: 2.12 MIL/uL — ABNORMAL LOW (ref 4.22–5.81)
RDW: 19.4 % — ABNORMAL HIGH (ref 11.5–15.5)
WBC: 7.4 10*3/uL (ref 4.0–10.5)
nRBC: 0.7 % — ABNORMAL HIGH (ref 0.0–0.2)

## 2021-01-17 LAB — PHOSPHORUS: Phosphorus: 2.7 mg/dL (ref 2.5–4.6)

## 2021-01-17 LAB — MAGNESIUM: Magnesium: 1.5 mg/dL — ABNORMAL LOW (ref 1.7–2.4)

## 2021-01-17 MED ORDER — THIAMINE HCL 100 MG PO TABS: 100.0000 mg | ORAL_TABLET | Freq: Every day | ORAL | 1 refills | Status: DC

## 2021-01-17 MED ORDER — MULTI-VITAMIN/MINERALS PO TABS: 1.0000 | ORAL_TABLET | Freq: Every day | ORAL | 2 refills | Status: AC

## 2021-01-17 MED ORDER — LACTULOSE 10 GM/15ML PO SOLN: 20.0000 g | Freq: Every day | ORAL | 0 refills | Status: DC

## 2021-01-17 MED ORDER — LEVOTHYROXINE SODIUM 100 MCG PO TABS: 100.0000 ug | ORAL_TABLET | Freq: Every day | ORAL | 0 refills | Status: DC

## 2021-01-17 MED ORDER — MAGNESIUM OXIDE -MG SUPPLEMENT 400 (240 MG) MG PO TABS: 400.0000 mg | ORAL_TABLET | Freq: Two times a day (BID) | ORAL | 1 refills | Status: DC

## 2021-01-17 MED ORDER — FOLIC ACID 1 MG PO TABS: 1.0000 mg | ORAL_TABLET | Freq: Every day | ORAL | 1 refills | Status: DC

## 2021-01-17 MED ORDER — PANTOPRAZOLE SODIUM 40 MG PO TBEC: 40.0000 mg | DELAYED_RELEASE_TABLET | Freq: Every day | ORAL | 1 refills | Status: DC

## 2021-01-17 MED ORDER — MAGNESIUM SULFATE 4 GM/100ML IV SOLN
4.0000 g | Freq: Once | INTRAVENOUS | Status: AC
  Administered 2021-01-17: 4 g via INTRAVENOUS
  Filled 2021-01-17: qty 100

## 2021-01-17 NOTE — Discharge Summary (Signed)
Joshua Medina ZDG:[id number] DOB: Nov 22, 1985 DOA: 01/11/2021  PCP: Pcp, No  Admit date: 01/11/2021 Discharge date: 01/17/2021  Time spent: 35  minutes  Recommendations for Outpatient Follow-up:  1. Establish with pcp 2. Sobriety support 3. Check of electrolytes in 1-2 weeks     Discharge Diagnoses:  Active Problems:   Hypothyroidism (acquired)   Alcoholic liver disease (HCC)   Alcohol withdrawal seizure (HCC)   Altered mental status   Hyponatremia   Discharge Condition: fair  Diet recommendation: regular  Filed Weights   01/11/21 1634  Weight: 63 kg    History of present illness:  Joshua Medina a 34 y.o.malewith medical history significant ofmalnutrition, hypothyroidism, alcohol liver disease, alcohol withdrawal seizure, delirium presented with seizure, altered mental status  When I saw patient, patient is lethargic, not good historian, oriented to time and people, he thought he is in [place], he could not recallseizureepisode, does report last drinking 2 days ago, reported to have some loose stool yesterday, otherwise patient reports no fever, chills, chest pain, shortness of breath, cough, abdominal pain, dysuria, leg swelling. PerChart review, EMS was reportedfromfamily that patient may had a seizure,EMSfound the patient has been shaking/tremors,patient was sent to the ED   Hospital Course:  # Acute encephalopathy  Likely multifactorial, postictal status alcohol withdrawal seizure versus Hepatic encephalopathy. Improved  # Depressed mood Recent death of wife. Psychiatry saw, no indication for inpt admission, did not advise starting meds now, advised outpt bh f/u  # Debility PT advises SNF, pt has refused. Given medicaid social work unable to put in place home health.  #Alcohol abuse, alcohol withdrawal seizure:Patient's seizure is most likely due to alcohol withdrawal seizure.  Patient is a heavy drinker and had hx of withdrawal seizures.Last  drink 2 days prior to admit. Ct-head is negative for acute intracranial abnormalities. No focal neurological findings on physical examination. Required precedex, pressors in icu last hospitalization. Pt's wife recently died of alcoholism. Treated with ciwa protocol, last ativan 5/27. Treated with 3 days high-dose iv thiamine.  - SW shared outpt alcohol abuse resources. Also referred to sliding scale PCP  #Alcoholic liver disease,? Liver cirrhosis. Thrombocytopenia MEL D sodium score 26, 90 days mortality 14-15% Maddrey'ss discrimination function 24.2, no indication for steroids. Plts improving off etoh - protonix 40 qd ppx  #hyponatremia:  Mild, stable. Likely 2/2 liver disease, malnutrition  #Elevated ammonia,? Hepatic encephalopathy:Likely chronic, 96on10/24/2000 - lactulose daily  #elevated lipase:  CT neg, asymptomatic, likely 2/2 liver disease  #Hypothyroidism: TSH elevated - resumed home levothyroxine  # Hypomagenesemia # Hypophosphatemia # Hypokalemia Much with diet, replenishment  # Anemia of chronic disease H stable in 8s, no signs bleeding - monitor  Procedures:  none   Consultations:  psychiatry  Discharge Exam: Vitals:   01/17/21 0450 01/17/21 0821  BP: 98/65 95/68  Pulse: (!) 104 86  Resp: 20 18  Temp: 99.4 F (37.4 C) 99 F (37.2 C)  SpO2: 97% 97%    General:chronically ill appearing, dissheveled HEENT:atraumatic, mmm Heme:No neck lymph node enlargement. Cardiac:S1/S2, RRR, No murmurs, No gallops or rubs. Respiratory:Good air movement bilaterally. No rales, wheezing, rhonchi or rubs. RJ:JOAC, nondistended, nontender, no rebound pain, no organomegaly, BS present. GU: No hematuria Ext:No pitting leg edema bilaterally. 2+DP/PT pulse bilaterally. Musculoskeletal:No joint deformities, No joint redness or warmth, no limitation of ROM in spin. Skin: No rashes. jaundiced Neuro:Lethargic,oriented X2,freely move 4 extremities, no  focal neurodeficit  Discharge Instructions   Discharge Instructions    Diet general   Complete by:  As directed    Increase activity slowly   Complete by: As directed      Allergies as of 01/17/2021      Reactions   Precedex [dexmedetomidine Hcl In Nacl] Other (See Comments)      Medication List    STOP taking these medications   OLANZapine zydis 5 MG disintegrating tablet Commonly known as: ZYPREXA     TAKE these medications   feeding supplement Liqd Take 237 mLs by mouth 2 (two) times daily between meals.   folic acid 1 MG tablet Commonly known as: FOLVITE Take 1 tablet (1 mg total) by mouth daily.   lactulose 10 GM/15ML solution Commonly known as: CHRONULAC Take 30 mLs (20 g total) by mouth daily.   levothyroxine 100 MCG tablet Commonly known as: Synthroid Take 1 tablet (100 mcg total) by mouth daily.   magnesium 30 MG tablet Take 1 tablet (30 mg total) by mouth 2 (two) times daily.   magnesium oxide 400 (240 Mg) MG tablet Commonly known as: MAG-OX Take 1 tablet (400 mg total) by mouth 2 (two) times daily.   multivitamin with minerals tablet Take 1 tablet by mouth daily.   multivitamin with minerals Tabs tablet Take 1 tablet by mouth daily.   nicotine 21 mg/24hr patch Commonly known as: NICODERM CQ - dosed in mg/24 hours Place 1 patch (21 mg total) onto the skin daily.   pantoprazole 40 MG tablet Commonly known as: PROTONIX Take 1 tablet (40 mg total) by mouth daily. Start taking on: Jan 18, 2021   thiamine 100 MG tablet Take 1 tablet (100 mg total) by mouth daily.      Allergies  Allergen Reactions  . Precedex [Dexmedetomidine Hcl In Nacl] Other (See Comments)    Follow-up Information    [employer] Call.   Contact information: [address] [place] [address] [phone]                The results of significant diagnostics from this hospitalization (including imaging, microbiology, ancillary and  laboratory) are listed below for reference.    Significant Diagnostic Studies: CT ABDOMEN WO CONTRAST  Result Date: 01/12/2021 CLINICAL DATA:  Elevated lipase EXAM: CT ABDOMEN WITHOUT CONTRAST TECHNIQUE: Multidetector CT imaging of the abdomen was performed following the standard protocol without IV contrast. COMPARISON:  06/05/2020 FINDINGS: Lower chest: Lung bases are free of acute infiltrate or sizable effusion. Hepatobiliary: Diffuse fatty infiltration of the liver is noted. The gallbladder is well distended. Pancreas: Unremarkable. No pancreatic ductal dilatation or surrounding inflammatory changes. Spleen: Normal in size without focal abnormality. Adrenals/Urinary Tract: Adrenal glands are within normal limits bilaterally. Scattered nonobstructing renal calculi are seen bilaterally. These measure less than 3 mm. Ureters as visualized are within normal limits. Stomach/Bowel: Visualized bowel shows no obstructive change. Vascular/Lymphatic: Aortic atherosclerosis. No enlarged abdominal lymph nodes. Other: No abdominal wall hernia or abnormality. Trace peritoneal fluid is again identified in the pericolic gutters bilaterally. Musculoskeletal: No acute or significant osseous findings. IMPRESSION: Pancreas appears within normal limits. Fatty liver. Bilateral small nonobstructing renal calculi. Trace peritoneal fluid which appears stable from the previous exam. Electronically Signed   By: Alcide Clever M.D.   On: 01/12/2021 08:46   CT Head Wo Contrast  Result Date: 01/11/2021 CLINICAL DATA:  Mental status change, unknown cause. Additional history provided: Reported seizure. EXAM: CT HEAD WITHOUT CONTRAST TECHNIQUE: Contiguous axial images were obtained from the base of the skull through the vertex without intravenous contrast. COMPARISON:  Prior  head CT examinations 06/13/2020 and earlier. FINDINGS: Brain: Redemonstrated age advanced cerebral and cerebellar atrophy. There is no acute intracranial hemorrhage.  No demarcated cortical infarct. No extra-axial fluid collection. No evidence of intracranial mass. No midline shift. Vascular: No hyperdense vessel. Skull: Normal. Negative for fracture or focal lesion. Sinuses/Orbits: Visualized orbits show no acute finding. Small volume fluid within an anterior right ethmoid air cell. IMPRESSION: No evidence of acute intracranial abnormality. Age advanced cerebral and cerebellar atrophy. Mild right ethmoid sinusitis. Electronically Signed   By: Jackey LogeKyle  Golden DO   On: 01/11/2021 18:59   DG Chest Port 1 View  Result Date: 01/12/2021 CLINICAL DATA:  Seizures. EXAM: PORTABLE CHEST 1 VIEW COMPARISON:  06/04/2020. FINDINGS: Mediastinum and hilar structures normal. Mild left base atelectasis. No pleural effusion or pneumothorax no acute bony abnormality. IMPRESSION: Mild left base atelectasis. Electronically Signed   By: Maisie Fushomas  Register   On: 01/12/2021 07:55    Microbiology: Recent Results (from the past 240 hour(s))  Resp Panel by RT-PCR (Flu A&B, Covid) Nasopharyngeal Swab     Status: None   Collection Time: 01/11/21  4:33 PM   Specimen: Nasopharyngeal Swab; Nasopharyngeal(NP) swabs in vial transport medium  Result Value Ref Range Status   SARS Coronavirus 2 by RT PCR NEGATIVE NEGATIVE Final    Comment: (NOTE) SARS-CoV-2 target nucleic acids are NOT DETECTED.  The SARS-CoV-2 RNA is generally detectable in upper respiratory specimens during the acute phase of infection. The lowest concentration of SARS-CoV-2 viral copies this assay can detect is 138 copies/mL. A negative result does not preclude SARS-Cov-2 infection and should not be used as the sole basis for treatment or other patient management decisions. A negative result may occur with  improper specimen collection/handling, submission of specimen other than nasopharyngeal swab, presence of viral mutation(s) within the areas targeted by this assay, and inadequate number of viral copies(<138 copies/mL). A  negative result must be combined with clinical observations, patient history, and epidemiological information. The expected result is Negative.  Fact Sheet for Patients:  BloggerCourse.comhttps://www.fda.gov/media/152166/download  Fact Sheet for Healthcare Providers:  SeriousBroker.ithttps://www.fda.gov/media/152162/download  This test is no t yet approved or cleared by the [place] FDA and  has been authorized for detection and/or diagnosis of SARS-CoV-2 by FDA under an Emergency Use Authorization (EUA). This EUA will remain  in effect (meaning this test can be used) for the duration of the COVID-19 declaration under Section 564(b)(1) of the Act, 21 U.S.C.section 360bbb-3(b)(1), unless the authorization is terminated  or revoked sooner.       Influenza A by PCR NEGATIVE NEGATIVE Final   Influenza B by PCR NEGATIVE NEGATIVE Final    Comment: (NOTE) The Xpert Xpress SARS-CoV-2/FLU/RSV plus assay is intended as an aid in the diagnosis of influenza from Nasopharyngeal swab specimens and should not be used as a sole basis for treatment. Nasal washings and aspirates are unacceptable for Xpert Xpress SARS-CoV-2/FLU/RSV testing.  Fact Sheet for Patients: BloggerCourse.comhttps://www.fda.gov/media/152166/download  Fact Sheet for Healthcare Providers: SeriousBroker.ithttps://www.fda.gov/media/152162/download  This test is not yet approved or cleared by the [place] FDA and has been authorized for detection and/or diagnosis of SARS-CoV-2 by FDA under an Emergency Use Authorization (EUA). This EUA will remain in effect (meaning this test can be used) for the duration of the COVID-19 declaration under Section 564(b)(1) of the Act, 21 U.S.C. section 360bbb-3(b)(1), unless the authorization is terminated or revoked.  Performed at [facility], [address] [place], [place] [id number]      Labs: Basic  Metabolic Panel: Recent Labs  Lab 01/13/21 0445 01/14/21 0334 01/15/21 0526 01/16/21 0437 01/17/21 0533  NA 135 133*  133* 133* 131*  K 3.1* 3.3* 4.1 3.7 3.6  CL 98 100 101 102 100  CO2 27 24 23 23 23   GLUCOSE 121* 107* 109* 93 110*  BUN 5* <5* <5* <5* 5*  CREATININE 0.47* 0.48* 0.56* 0.58* 0.56*  CALCIUM 6.9* 6.9* 7.3* 7.7* 8.6*  MG 1.7 1.8 1.7 1.8 1.5*  PHOS <1.0* 1.4* 1.7* 2.6 2.7   Liver Function Tests: Recent Labs  Lab 01/13/21 0445 01/14/21 0334 01/15/21 0526 01/16/21 0437 01/17/21 0533  AST 353* 323* 280* 238* 191*  ALT 99* 105* 103* 97* 86*  ALKPHOS 296* 306* 334* 343* 354*  BILITOT 11.0* 10.6* 10.1* 9.1* 8.7*  PROT 5.9* 5.7* 5.6* 5.9* 5.8*  ALBUMIN 3.1* 2.9* 2.8* 2.8* 2.8*   Recent Labs  Lab 01/11/21 1633 01/12/21 0321  LIPASE 1,110* 650*  AMYLASE  --  256*   Recent Labs  Lab 01/11/21 1633 01/12/21 0321  AMMONIA 97* 37*   CBC: Recent Labs  Lab 01/11/21 1633 01/12/21 0321 01/13/21 0445 01/14/21 0334 01/15/21 0526 01/16/21 0437 01/17/21 0533  WBC 9.8   < > 7.0 5.9 6.1 6.4 7.4  NEUTROABS 7.9*  --   --   --   --   --   --   HGB 9.4*   < > 7.4* 7.2* 7.7* 8.0* 8.0*  HCT 26.4*   < > 21.0* 21.1* 22.4* 22.6* 23.3*  MCV 106.0*   < > 106.1* 109.9* 110.3* 112.4* 109.9*  PLT 37*   < > 43* 53* 60* 83* 111*   < > = values in this interval not displayed.   Cardiac Enzymes: No results for input(s): CKTOTAL, CKMB, CKMBINDEX, TROPONINI in the last 168 hours. BNP: BNP (last 3 results) No results for input(s): BNP in the last 8760 hours.  ProBNP (last 3 results) No results for input(s): PROBNP in the last 8760 hours.  CBG: No results for input(s): GLUCAP in the last 168 hours.     Signed:  01/19/21 MD.  [facility] 01/17/2021, 10:17 AM

## 2021-01-17 NOTE — Discharge Instructions (Signed)
Alcohol Withdrawal Syndrome When a person who drinks a lot of alcohol stops drinking, he or she may have unpleasant and serious symptoms. These symptoms are called alcohol withdrawal syndrome. This condition may be mild or severe. It can be life-threatening. It can cause:  Shaking that you cannot control (tremor).  Sweating.  Headache.  Feeling fearful, upset, grouchy, or depressed.  Trouble sleeping (insomnia).  Nightmares.  Fast or uneven heartbeats (palpitations).  Alcohol cravings.  Feeling sick to your stomach (nausea).  Throwing up (vomiting).  Being bothered by light and sounds.  Confusion.  Trouble thinking clearly.  Not being hungry (loss of appetite).  Big changes in mood (mood swings). If you have all of the following symptoms at the same time, get help right away:  High blood pressure.  Fast heartbeat.  Trouble breathing.  Seizures.  Seeing, hearing, feeling, smelling, or tasting things that are not there (hallucinations). These symptoms are known as delirium tremens (DTs). They must be treated at the hospital right away. Follow these instructions at home:  Take over-the-counter and prescription medicines only as told by your doctor. This includes vitamins.  Do not drink alcohol.  Do not drive until your doctor says that this is safe for you.  Have someone stay with you or be available in case you need help. This should be someone you trust. This person can help you with your symptoms. He or she can also help you to not drink.  Drink enough fluid to keep your pee (urine) pale yellow.  Think about joining a support group or a treatment program to help you stop drinking.  Keep all follow-up visits as told by your doctor. This is important.   Contact a doctor if:  Your symptoms get worse.  You cannot eat or drink without throwing up.  You have a hard time not drinking alcohol.  You cannot stop drinking alcohol. Get help right away  if:  You have fast or uneven heartbeats.  You have chest pain.  You have trouble breathing.  You have a seizure for the first time.  You see, hear, feel, smell, or taste something that is not there.  You get very confused. Summary  When a person who drinks a lot of alcohol stops drinking, he or she may have serious symptoms. This is called alcohol withdrawal syndrome.  Delirium tremens (DTs) is a group of life-threatening symptoms. You should get help right away if you have these symptoms.  Think about joining an alcohol support group or a treatment program. This information is not intended to replace advice given to you by your health care provider. Make sure you discuss any questions you have with your health care provider. Document Revised: 07/20/2017 Document Reviewed: 04/13/2017 Elsevier Patient Education  2021 Elsevier Inc.  

## 2021-01-17 NOTE — Progress Notes (Addendum)
Magnesium infusion administered/pt pulled out IV access/Magnesium infusion not complete. MD made aware/ok to continue with discharge.   Discharge instructions explained to pt and mother, Cheryl/both verbalized understanding. Will transport off unit via wheelchair.

## 2021-01-17 NOTE — Consult Note (Signed)
PHARMACY CONSULT NOTE  Pharmacy Consult for Electrolyte Monitoring and Replacement   Recent Labs: Potassium (mmol/L)  Date Value  01/17/2021 3.6   Magnesium (mg/dL)  Date Value  92/06/9416 1.5 (L)   Calcium (mg/dL)  Date Value  40/81/4481 8.6 (L)   Albumin (g/dL)  Date Value  85/63/1497 2.8 (L)   Phosphorus (mg/dL)  Date Value  02/63/7858 2.7   Sodium (mmol/L)  Date Value  01/17/2021 131 (L)   Corrected Ca: 9.6 mg/dL  Assessment: 34 y.o.malewith medical history significant ofmalnutrition, hypothyroidism, alcohol liver disease, alcohol withdrawal seizure, delirium presented with seizure, altered mental status. He has noted hyponatremia at baseline which appears to be stable. He has been on the following therapies during this admission:  magnesium oxide 400 mg po BID since 05/25 KCl 20 mEq po BID since 05/28  Additionally, he received 2 grams IV magnesium sulfate yesterday with no improvement in levels   Goal of Therapy:  Electrolytes WNL  Plan:   Continue KCl 20 mEq po BID and magnesium oxide 400 mg po BID  magnesium sulfate 4 grams IV x 1   Re-check electrolytes in am  Lowella Bandy ,PharmD Clinical Pharmacist 01/17/2021 8:54 AM

## 2021-03-24 NOTE — Congregational Nurse Program (Signed)
  Dept: [phone]   Congregational Nurse Program Note  Date of Encounter: 03/24/2021 Client to clinic for vital sign check. He reports he is not currently taking his thyroid medication. RN assisted client with application to [facility], Rn twill drop off today. Support given..  Past Medical History: Past Medical History:  Diagnosis Date   Graves disease    Thyroid disease     Encounter Details:

## 2021-04-13 ENCOUNTER — Ambulatory Visit: Payer: Self-pay | Admitting: Gerontology

## 2021-04-20 ENCOUNTER — Ambulatory Visit: Payer: Medicaid Other | Admitting: Gerontology

## 2021-06-22 ENCOUNTER — Emergency Department
Admission: EM | Admit: 2021-06-22 | Discharge: 2021-06-23 | Disposition: A | Discharge status: Home / Self Care | Payer: Medicaid Other | Attending: Emergency Medicine | Admitting: Emergency Medicine

## 2021-06-22 ENCOUNTER — Other Ambulatory Visit: Payer: Self-pay

## 2021-06-22 DIAGNOSIS — Y908 Blood alcohol level of 240 mg/100 ml or more: Secondary | ICD-10-CM | POA: Insufficient documentation

## 2021-06-22 DIAGNOSIS — Z79899 Other long term (current) drug therapy: Secondary | ICD-10-CM | POA: Insufficient documentation

## 2021-06-22 DIAGNOSIS — F1010 Alcohol abuse, uncomplicated: Secondary | ICD-10-CM | POA: Insufficient documentation

## 2021-06-22 DIAGNOSIS — F17210 Nicotine dependence, cigarettes, uncomplicated: Secondary | ICD-10-CM | POA: Insufficient documentation

## 2021-06-22 DIAGNOSIS — R7309 Other abnormal glucose: Secondary | ICD-10-CM | POA: Insufficient documentation

## 2021-06-22 DIAGNOSIS — E860 Dehydration: Secondary | ICD-10-CM | POA: Insufficient documentation

## 2021-06-22 DIAGNOSIS — E039 Hypothyroidism, unspecified: Secondary | ICD-10-CM | POA: Insufficient documentation

## 2021-06-22 LAB — CBC
HCT: 43.6 % (ref 39.0–52.0)
Hemoglobin: 15.2 g/dL (ref 13.0–17.0)
MCH: 33.9 pg (ref 26.0–34.0)
MCHC: 34.9 g/dL (ref 30.0–36.0)
MCV: 97.1 fL (ref 80.0–100.0)
Platelets: 102 10*3/uL — ABNORMAL LOW (ref 150–400)
RBC: 4.49 MIL/uL (ref 4.22–5.81)
RDW: 14.8 % (ref 11.5–15.5)
WBC: 7.1 10*3/uL (ref 4.0–10.5)
nRBC: 0 % (ref 0.0–0.2)

## 2021-06-22 LAB — BASIC METABOLIC PANEL
Anion gap: 8 (ref 5–15)
BUN: 11 mg/dL (ref 6–20)
CO2: 32 mmol/L (ref 22–32)
Calcium: 8 mg/dL — ABNORMAL LOW (ref 8.9–10.3)
Chloride: 99 mmol/L (ref 98–111)
Creatinine, Ser: 1.2 mg/dL (ref 0.61–1.24)
GFR, Estimated: >60 mL/min (ref >60)
Glucose, Bld: 122 mg/dL — ABNORMAL HIGH (ref 70–99)
Potassium: 3.8 mmol/L (ref 3.5–5.1)
Sodium: 139 mmol/L (ref 135–145)

## 2021-06-22 LAB — CBG MONITORING, ED: Glucose-Capillary: 114 mg/dL — ABNORMAL HIGH (ref 70–99)

## 2021-06-22 LAB — ETHANOL: Alcohol, Ethyl (B): 424 mg/dL (ref <10)

## 2021-06-22 MED ORDER — FOLIC ACID 1 MG PO TABS
1.0000 mg | ORAL_TABLET | Freq: Once | ORAL | Status: AC
  Administered 2021-06-23: 1 mg via ORAL
  Filled 2021-06-22: qty 1

## 2021-06-22 MED ORDER — LACTATED RINGERS IV BOLUS
1000.0000 mL | Freq: Once | INTRAVENOUS | Status: AC
  Administered 2021-06-23: 1000 mL via INTRAVENOUS

## 2021-06-22 MED ORDER — THIAMINE HCL 100 MG/ML IJ SOLN
100.0000 mg | Freq: Once | INTRAMUSCULAR | Status: AC
  Administered 2021-06-23: 100 mg via INTRAVENOUS
  Filled 2021-06-22: qty 2

## 2021-06-22 MED ORDER — CHLORDIAZEPOXIDE HCL 25 MG PO CAPS
50.0000 mg | ORAL_CAPSULE | Freq: Three times a day (TID) | ORAL | Status: DC
  Administered 2021-06-23: 50 mg via ORAL
  Filled 2021-06-22: qty 2

## 2021-06-22 NOTE — ED Notes (Signed)
Patient states he has been feeling weak and has not been eating much or drinking. Significant other that is with patient states he has been this way since the weekend.

## 2021-06-22 NOTE — ED Notes (Signed)
Person with patient asked for cup of ice and took to restroom and filled with orange juice and liquor. When returning to bedside she opened bag and asked patient if he wanted some water and started reaching into her book bag. This writer told SF she is not allowed to give patient that drink and I would get patient some water. Ask SF was closing bag there was a bottle of liquor and cup of orange juice with liquor mixed in.  Liquor bottle and orange juice mixed liquor disposed of in sink. Security made aware. Consulting civil engineer and EDP made aware.

## 2021-06-22 NOTE — ED Triage Notes (Signed)
Pt comes into the ED via EMS from [facility], pt is homeless. His friend called 911 concerned the pt is dehydrated, daily liquor drinking , only eaten 3 slices of pizza in 3 days. Pt is a/ox4  129/92 CBG 114 80HR

## 2021-06-23 LAB — TSH: TSH: 64.668 u[IU]/mL — ABNORMAL HIGH (ref 0.350–4.500)

## 2021-06-23 LAB — T4, FREE: Free T4: <0.25 ng/dL — ABNORMAL LOW (ref 0.61–1.12)

## 2021-06-23 NOTE — ED Provider Notes (Signed)
[facility] Emergency Department Provider Note  ____________________________________________  Time seen: Approximately 12:50 AM  I have reviewed the triage vital signs and the nursing notes.   HISTORY  Chief Complaint Weakness   HPI Joshua Medina is a 35 y.o. male with a history of alcohol abuse/dependence with complicated withdrawals who presents for evaluation of concerns of dehydration.  Patient reports that he drinks heavily on a daily basis since he was 35 years old.  Has no desire on detox.  Denies any suicidal homicidal thoughts.  Reports that over the last 3 days he has had no appetite, has not eaten or drank anything other than alcohol.  Today felt very weak and was concerned that he was dehydrated.  Patient is here with a friend.  He denies any signs of complicated withdrawals including headache, nausea, vomiting, tremors, anxiety.  No chest pain or shortness of breath.   Past Medical History:  Diagnosis Date   Graves disease    Thyroid disease     Patient Active Problem List   Diagnosis Date Noted   Altered mental status 01/11/2021   Hyponatremia 01/11/2021   Hypotension 06/10/2020   Hypoglycemia without diagnosis of diabetes mellitus 06/10/2020   Alcohol withdrawal seizure (HCC) 06/04/2020   Malnutrition of moderate degree 02/17/2020   Hypomagnesemia 02/16/2020   Delirium tremens (HCC) 02/16/2020   Alcohol use disorder, moderate, dependence (HCC) 02/15/2020   Hypothyroidism (acquired) 02/15/2020   Alcoholic liver disease (HCC) 02/15/2020   Alcohol withdrawal seizure with complication, with delirium (HCC) 02/15/2020   Alcohol withdrawal seizure, with delirium (HCC) 02/15/2020   Thrombocytopenia (HCC) 02/15/2020   Anemia 02/15/2020   Hypokalemia 02/15/2020    Past Surgical History:  Procedure Laterality Date   thyroid ablation with radioactive I-131      Prior to Admission medications   Medication Sig Start Date End Date Taking?  Authorizing Provider  feeding supplement, ENSURE ENLIVE, (ENSURE ENLIVE) LIQD Take 237 mLs by mouth 2 (two) times daily between meals. Patient not taking: No sig reported 02/20/20   Elease Etienne, MD  folic acid (FOLVITE) 1 MG tablet Take 1 tablet (1 mg total) by mouth daily. 01/17/21   Wouk, Wilfred Curtis, MD  lactulose (CHRONULAC) 10 GM/15ML solution Take 30 mLs (20 g total) by mouth daily. 01/17/21   Wouk, Wilfred Curtis, MD  levothyroxine (SYNTHROID) 100 MCG tablet Take 1 tablet (100 mcg total) by mouth daily. 01/17/21 02/16/21  Wouk, Wilfred Curtis, MD  magnesium 30 MG tablet Take 1 tablet (30 mg total) by mouth 2 (two) times daily. Patient not taking: Reported on 01/11/2021 06/11/20   Marrion Coy, MD  magnesium oxide (MAG-OX) 400 (240 Mg) MG tablet Take 1 tablet (400 mg total) by mouth 2 (two) times daily. 01/17/21   Wouk, Wilfred Curtis, MD  Multiple Vitamins-Minerals (MULTIVITAMIN WITH MINERALS) tablet Take 1 tablet by mouth daily. 01/17/21 01/17/22  Wouk, Wilfred Curtis, MD  nicotine (NICODERM CQ - DOSED IN MG/24 HOURS) 21 mg/24hr patch Place 1 patch (21 mg total) onto the skin daily. Patient not taking: No sig reported 02/21/20   Elease Etienne, MD  pantoprazole (PROTONIX) 40 MG tablet Take 1 tablet (40 mg total) by mouth daily. 01/18/21   Wouk, Wilfred Curtis, MD  thiamine 100 MG tablet Take 1 tablet (100 mg total) by mouth daily. 01/17/21   Wouk, Wilfred Curtis, MD    Allergies Precedex [dexmedetomidine hcl in nacl]  Family History  Problem Relation Age of Onset   Diabetes Paternal  Grandmother     Social History Social History   Tobacco Use   Smoking status: Every Day    Types: Cigarettes   Smokeless tobacco: Never  Vaping Use   Vaping Use: Never used  Substance Use Topics   Alcohol use: Yes   Drug use: Never    Review of Systems  Constitutional: Negative for fever. + generalized weakness Eyes: Negative for visual changes. ENT: Negative for sore throat. Neck: No neck pain   Cardiovascular: Negative for chest pain. Respiratory: Negative for shortness of breath. Gastrointestinal: Negative for abdominal pain, vomiting or diarrhea. Genitourinary: Negative for dysuria. Musculoskeletal: Negative for back pain. Skin: Negative for rash. Neurological: Negative for headaches, weakness or numbness. Psych: No SI or HI  ____________________________________________   PHYSICAL EXAM:  VITAL SIGNS: ED Triage Vitals  Enc Vitals Group     BP 06/22/21 1332 107/88     Pulse Rate 06/22/21 1332 85     Resp 06/22/21 1332 16     Temp 06/22/21 1332 98.1 F (36.7 C)     Temp Source 06/22/21 1332 Oral     SpO2 06/22/21 1332 100 %     Weight 06/22/21 1333 165 lb (74.8 kg)     Height 06/22/21 1333 5\' 10"  (1.778 m)     Head Circumference --      Peak Flow --      Pain Score --      Pain Loc --      Pain Edu? --      Excl. in GC? --     Constitutional: Alert and oriented. Well appearing and in no apparent distress. HEENT:      Head: Normocephalic and atraumatic.         Eyes: Conjunctivae are normal. Sclera is non-icteric.       Mouth/Throat: Mucous membranes are moist.       Neck: Supple with no signs of meningismus. Cardiovascular: Regular rate and rhythm. No murmurs, gallops, or rubs. 2+ symmetrical distal pulses are present in all extremities. No JVD. Respiratory: Normal respiratory effort. Lungs are clear to auscultation bilaterally.  Gastrointestinal: Soft, non tender, and non distended with positive bowel sounds. No rebound or guarding. Genitourinary: No CVA tenderness. Musculoskeletal:  No edema, cyanosis, or erythema of extremities. Neurologic: Normal speech and language. Face is symmetric. Moving all extremities. No gross focal neurologic deficits are appreciated. Skin: Skin is warm, dry and intact. No rash noted. Psychiatric: Mood and affect are normal. Speech and behavior are normal.  ____________________________________________   LABS (all labs  ordered are listed, but only abnormal results are displayed)  Labs Reviewed  BASIC METABOLIC PANEL - Abnormal; Notable for the following components:      Result Value   Glucose, Bld 122 (*)    Calcium 8.0 (*)    All other components within normal limits  CBC - Abnormal; Notable for the following components:   Platelets 102 (*)    All other components within normal limits  ETHANOL - Abnormal; Notable for the following components:   Alcohol, Ethyl (B) 424 (*)    All other components within normal limits  CBG MONITORING, ED - Abnormal; Notable for the following components:   Glucose-Capillary 114 (*)    All other components within normal limits  RESP PANEL BY RT-PCR (FLU A&B, COVID) ARPGX2  URINALYSIS, ROUTINE W REFLEX MICROSCOPIC  TSH  T4, FREE  URINE DRUG SCREEN, QUALITATIVE ([facility] ONLY)  URINALYSIS, COMPLETE ([facility]) WITH MICROSCOPIC   ____________________________________________  EKG  none  ____________________________________________  RADIOLOGY  none  ____________________________________________   PROCEDURES  Procedure(s) performed: None Procedures   Critical Care performed:  None ____________________________________________   INITIAL IMPRESSION / ASSESSMENT AND PLAN / ED COURSE  35 y.o. male with a history of alcohol abuse/dependence with complicated withdrawals who presents for evaluation of concerns of dehydration after binging on alcohol for 3 days and not drinking or eating anything else.  Patient is well-appearing in no distress, no signs of withdrawals.  Vitals are within normal limits.  Exam is nonfocal.  Looks clinically sober.  Initial alcohol level when patient arrived 12 hours ago was 424.  Labs with no significant electrolyte derangements, no signs of alcoholic ketoacidosis, no signs of hypoglycemia.  Patient received a liter bolus, IV thiamine and folate.  He denies suicidal homicidal thoughts and therefore does not meet criteria for IVC.  We did offer  placement for detox with patient declines as he does not wish to detox at this time.  He has no other medical complaints.  At this time he is stable for discharge home with a friend who is here at bedside.  Will refer him to [facility] for outpatient detox.  Discussed my standard return precautions and follow-up      _____________________________________________ Please note:  Patient was evaluated in Emergency Department today for the symptoms described in the history of present illness. Patient was evaluated in the context of the global COVID-19 pandemic, which necessitated consideration that the patient might be at risk for infection with the SARS-CoV-2 virus that causes COVID-19. Institutional protocols and algorithms that pertain to the evaluation of patients at risk for COVID-19 are in a state of rapid change based on information released by regulatory bodies including the CDC and federal and state organizations. These policies and algorithms were followed during the patient's care in the ED.  Some ED evaluations and interventions may be delayed as a result of limited staffing during the pandemic.   [place] Controlled Substance Database was reviewed by me. ____________________________________________   FINAL CLINICAL IMPRESSION(S) / ED DIAGNOSES   Final diagnoses:  Alcohol abuse      NEW MEDICATIONS STARTED DURING THIS VISIT:  ED Discharge Orders     None        Note:  This document was prepared using Dragon voice recognition software and may include unintentional dictation errors.    Nita Sickle, MD 06/23/21 [phone]

## 2021-06-23 NOTE — Progress Notes (Signed)
   06/22/21 2120  Clinical Encounter Type  Visited With Patient;Other (Comment) (friend)  Visit Type Initial  Referral From Other (Comment) (rounding)  Spiritual Encounters  Spiritual Needs Other (Comment) (social support)  Chaplain Burris checked-in on Pt's well-being due to him lying on floor in ED waiting area. Engaged friend who accompanied Pt; she states that both are currently unhoused and chaplain discussed their social needs. Chaplain Burris provided hospitality and supportive presence. Checked in multiple times while passing through unit. Eventually had to move away when Pt's friend became unreasonable in her expectations when chaplain needed to respond to urgent calls. Chaplain attempted to focuse on Pt and at least make him more comfortable.

## 2021-06-28 NOTE — Congregational Nurse Program (Signed)
  Dept: [phone]   Congregational Nurse Program Note  Date of Encounter: 06/28/2021 Client to clinic today for BP assessment. BP 98/60. Discussed making an appointment at the [facility], client had one 8/31, but was not able to attend. He was agreeable for RN to set up an appointment. Appointment set for 11/15 @ 2:30. Client is aware that this appointment will be at [facility], NOT at [facility]. Reminder card given.  Past Medical History: Past Medical History:  Diagnosis Date   Graves disease    Thyroid disease     Encounter Details:  CNP Questionnaire - 06/28/21 1124       Questionnaire   Do you give verbal consent to treat you today? Yes    Location Patient Served  [facility]    Visit Setting [place] or Organization    Patient Status Unknown    Insurance Uninsured ([employer] Connects/Self-Pay)    Insurance Referral N/A    Medication Have Medication Insecurities    Medical Provider No   Client had an appointment at [facility] on 8/31, but was unable to attend. New appointment made today atr his request  for 11/15 at 2:30 , appointment will be at [facility]. Client aware and reminder card   Screening Referrals N/A    Medical Referral Other    Medical Appointment Made Other    Food Have Food Insecurities   client is homeless, eats at [facility] and other places   Transportation Need transportation assistance    Housing/Utilities No permanent housing    Interpersonal Safety N/A   cleint is homeless and sleeping in a car or outside   Intervention Blood pressure;Support

## 2021-07-05 ENCOUNTER — Ambulatory Visit: Payer: Medicaid Other | Admitting: Gerontology

## 2021-08-11 NOTE — Congregational Nurse Program (Signed)
°  Dept: [phone]   Congregational Nurse Program Note  Date of Encounter: 08/11/2021 Client to clinic for vital sign check. Reports some congestion. Peppermint oil used to help clear. BP 110/70 (BP Location: Left Arm, Patient Position: Sitting, Cuff Size: Normal)    Pulse (!) 57   SpO2 100%  He continues to drink daily, jaundice noted to skin with facial swelling. RN to continue to encourage him to attend an MD appointment. Emotional support given.   Past Medical History: Past Medical History:  Diagnosis Date   Graves disease    Thyroid disease     Encounter Details:  CNP Questionnaire - 08/11/21 1000       Questionnaire   Do you give verbal consent to treat you today? Yes    Location Patient Served  [facility]    Visit Setting [employer] or Organization    Patient Status Homeless    Insurance Uninsured ([employer] Connects/Self-Pay)    Insurance Referral N/A    Medication Have Medication Insecurities   client is currenbtly not taking medications   Medical Provider No   Client had an appointment at [facility] on 8/[facility], but was unable to attend. New appointment made today atr his request  for 11/15 at 2:30 , appointment will be at [facility]. Client aware and reminder card. Client did not attend the apt on 11/15   Screening Referrals N/A    Medical Referral Other;N/A    Medical Appointment Made N/A    Food Have Food Insecurities   client is homeless, eats at [facility] and other places   Transportation N/A   client uses the [employer] bus at this time   Housing/Utilities No permanent housing    Economist N/A   cleint is homeless and sleeping in a car or outside   Intervention Blood pressure;Support

## 2021-08-20 ENCOUNTER — Emergency Department
Admission: EM | Admit: 2021-08-20 | Discharge: 2021-08-21 | Disposition: A | Discharge status: Home / Self Care | Payer: Medicaid Other | Attending: Emergency Medicine | Admitting: Emergency Medicine

## 2021-08-20 ENCOUNTER — Encounter: Payer: Self-pay | Admitting: Emergency Medicine

## 2021-08-20 ENCOUNTER — Other Ambulatory Visit: Payer: Self-pay

## 2021-08-20 DIAGNOSIS — F17210 Nicotine dependence, cigarettes, uncomplicated: Secondary | ICD-10-CM | POA: Insufficient documentation

## 2021-08-20 DIAGNOSIS — F10920 Alcohol use, unspecified with intoxication, uncomplicated: Secondary | ICD-10-CM

## 2021-08-20 DIAGNOSIS — E119 Type 2 diabetes mellitus without complications: Secondary | ICD-10-CM | POA: Insufficient documentation

## 2021-08-20 DIAGNOSIS — F10229 Alcohol dependence with intoxication, unspecified: Secondary | ICD-10-CM | POA: Insufficient documentation

## 2021-08-20 DIAGNOSIS — E039 Hypothyroidism, unspecified: Secondary | ICD-10-CM | POA: Insufficient documentation

## 2021-08-20 DIAGNOSIS — Z79899 Other long term (current) drug therapy: Secondary | ICD-10-CM | POA: Insufficient documentation

## 2021-08-20 DIAGNOSIS — Y900 Blood alcohol level of less than 20 mg/100 ml: Secondary | ICD-10-CM | POA: Insufficient documentation

## 2021-08-20 LAB — COMPREHENSIVE METABOLIC PANEL
ALT: 27 U/L (ref 0–44)
AST: 63 U/L — ABNORMAL HIGH (ref 15–41)
Albumin: 3.9 g/dL (ref 3.5–5.0)
Alkaline Phosphatase: 185 U/L — ABNORMAL HIGH (ref 38–126)
Anion gap: 9 (ref 5–15)
BUN: 14 mg/dL (ref 6–20)
CO2: 33 mmol/L — ABNORMAL HIGH (ref 22–32)
Calcium: 9 mg/dL (ref 8.9–10.3)
Chloride: 102 mmol/L (ref 98–111)
Creatinine, Ser: 1.01 mg/dL (ref 0.61–1.24)
GFR, Estimated: >60 mL/min (ref >60)
Glucose, Bld: 97 mg/dL (ref 70–99)
Potassium: 3.7 mmol/L (ref 3.5–5.1)
Sodium: 144 mmol/L (ref 135–145)
Total Bilirubin: 0.7 mg/dL (ref 0.3–1.2)
Total Protein: 7.7 g/dL (ref 6.5–8.1)

## 2021-08-20 LAB — CBC WITH DIFFERENTIAL/PLATELET
Abs Immature Granulocytes: 0.17 10*3/uL — ABNORMAL HIGH (ref 0.00–0.07)
Basophils Absolute: 0.2 10*3/uL — ABNORMAL HIGH (ref 0.0–0.1)
Basophils Relative: 2 %
Eosinophils Absolute: 0.1 10*3/uL (ref 0.0–0.5)
Eosinophils Relative: 1 %
HCT: 38.5 % — ABNORMAL LOW (ref 39.0–52.0)
Hemoglobin: 13 g/dL (ref 13.0–17.0)
Immature Granulocytes: 2 %
Lymphocytes Relative: 41 %
Lymphs Abs: 3.9 10*3/uL (ref 0.7–4.0)
MCH: 36.3 pg — ABNORMAL HIGH (ref 26.0–34.0)
MCHC: 33.8 g/dL (ref 30.0–36.0)
MCV: 107.5 fL — ABNORMAL HIGH (ref 80.0–100.0)
Monocytes Absolute: 0.6 10*3/uL (ref 0.1–1.0)
Monocytes Relative: 6 %
Neutro Abs: 4.6 10*3/uL (ref 1.7–7.7)
Neutrophils Relative %: 48 %
Platelets: 150 10*3/uL (ref 150–400)
RBC: 3.58 MIL/uL — ABNORMAL LOW (ref 4.22–5.81)
RDW: 16.2 % — ABNORMAL HIGH (ref 11.5–15.5)
WBC: 9.5 10*3/uL (ref 4.0–10.5)
nRBC: 0 % (ref 0.0–0.2)

## 2021-08-20 LAB — ETHANOL: Alcohol, Ethyl (B): 438 mg/dL (ref <10)

## 2021-08-20 MED ORDER — THIAMINE HCL 100 MG/ML IJ SOLN: 100.0000 mg | Freq: Once | INTRAMUSCULAR | Status: DC

## 2021-08-20 MED ORDER — SODIUM CHLORIDE 0.9 % IV BOLUS
1000.0000 mL | Freq: Once | INTRAVENOUS | Status: AC
  Administered 2021-08-20: 1000 mL via INTRAVENOUS

## 2021-08-20 MED ORDER — THIAMINE HCL 100 MG/ML IJ SOLN: 100.0000 mg | Freq: Every day | INTRAMUSCULAR | Status: DC

## 2021-08-20 MED ORDER — LORAZEPAM 1 MG PO TABS: 1.0000 mg | ORAL_TABLET | ORAL | Status: DC | PRN

## 2021-08-20 MED ORDER — THIAMINE HCL 100 MG PO TABS
100.0000 mg | ORAL_TABLET | Freq: Every day | ORAL | Status: DC
  Administered 2021-08-20: 100 mg via ORAL
  Filled 2021-08-20: qty 1

## 2021-08-20 MED ORDER — FOLIC ACID 1 MG PO TABS
1.0000 mg | ORAL_TABLET | Freq: Every day | ORAL | Status: DC
  Administered 2021-08-20: 1 mg via ORAL
  Filled 2021-08-20: qty 1

## 2021-08-20 MED ORDER — LORAZEPAM 2 MG/ML IJ SOLN: 1.0000 mg | INTRAMUSCULAR | Status: DC | PRN

## 2021-08-20 MED ORDER — ADULT MULTIVITAMIN W/MINERALS CH
1.0000 | ORAL_TABLET | Freq: Every day | ORAL | Status: DC
  Administered 2021-08-20: 1 via ORAL
  Filled 2021-08-20: qty 1

## 2021-08-20 NOTE — ED Provider Notes (Signed)
[facility] Emergency Department Provider Note   ____________________________________________   Event Date/Time   First MD Initiated Contact with Patient 08/20/21 1927     (approximate)  I have reviewed the triage vital signs and the nursing notes.   HISTORY  Chief Complaint Alcohol Intoxication    HPI Joshua Medina is a 35 y.o. male reports that he has a history of alcoholism.  Patient states he is here because of alcohol problems and dehydration.  Denies being any pain.  He denies any injury occurred.  Believes he was at home and that someone he lives with called 911 because of his heavy drinking  No desire to harm himself or anyone else.  States his goal is to get hydrated and then be able to go home  He is not interested in alcohol rehabilitation treatment.  Denies any hallucinations.  No desire to harm himself.  Does not feel suicidal.   Drinks alcohol heavily daily.  Has not had any recent illness falls or injuries.  Feels like he is got really dehydrated from drinking a lot of alcohol    Past Medical History:  Diagnosis Date   Graves disease    Thyroid disease     Patient Active Problem List   Diagnosis Date Noted   Altered mental status 01/11/2021   Hyponatremia 01/11/2021   Hypotension 06/10/2020   Hypoglycemia without diagnosis of diabetes mellitus 06/10/2020   Alcohol withdrawal seizure (HCC) 06/04/2020   Malnutrition of moderate degree 02/17/2020   Hypomagnesemia 02/16/2020   Delirium tremens (HCC) 02/16/2020   Alcohol use disorder, moderate, dependence (HCC) 02/15/2020   Hypothyroidism (acquired) 02/15/2020   Alcoholic liver disease (HCC) 02/15/2020   Alcohol withdrawal seizure with complication, with delirium (HCC) 02/15/2020   Alcohol withdrawal seizure, with delirium (HCC) 02/15/2020   Thrombocytopenia (HCC) 02/15/2020   Anemia 02/15/2020   Hypokalemia 02/15/2020    Past Surgical History:  Procedure Laterality Date    thyroid ablation with radioactive I-131      Prior to Admission medications   Medication Sig Start Date End Date Taking? Authorizing Provider  feeding supplement, ENSURE ENLIVE, (ENSURE ENLIVE) LIQD Take 237 mLs by mouth 2 (two) times daily between meals. Patient not taking: No sig reported 02/20/20   Elease Etienne, MD  folic acid (FOLVITE) 1 MG tablet Take 1 tablet (1 mg total) by mouth daily. 01/17/21   Wouk, Wilfred Curtis, MD  lactulose (CHRONULAC) 10 GM/15ML solution Take 30 mLs (20 g total) by mouth daily. 01/17/21   Wouk, Wilfred Curtis, MD  levothyroxine (SYNTHROID) 100 MCG tablet Take 1 tablet (100 mcg total) by mouth daily. 01/17/21 02/16/21  Wouk, Wilfred Curtis, MD  magnesium 30 MG tablet Take 1 tablet (30 mg total) by mouth 2 (two) times daily. Patient not taking: Reported on 01/11/2021 06/11/20   Marrion Coy, MD  magnesium oxide (MAG-OX) 400 (240 Mg) MG tablet Take 1 tablet (400 mg total) by mouth 2 (two) times daily. 01/17/21   Wouk, Wilfred Curtis, MD  Multiple Vitamins-Minerals (MULTIVITAMIN WITH MINERALS) tablet Take 1 tablet by mouth daily. 01/17/21 01/17/22  Wouk, Wilfred Curtis, MD  nicotine (NICODERM CQ - DOSED IN MG/24 HOURS) 21 mg/24hr patch Place 1 patch (21 mg total) onto the skin daily. Patient not taking: No sig reported 02/21/20   Elease Etienne, MD  pantoprazole (PROTONIX) 40 MG tablet Take 1 tablet (40 mg total) by mouth daily. 01/18/21   Wouk, Wilfred Curtis, MD  thiamine 100 MG tablet Take 1  tablet (100 mg total) by mouth daily. 01/17/21   Wouk, Wilfred Curtis, MD    Allergies Precedex [dexmedetomidine hcl in nacl]  Family History  Problem Relation Age of Onset   Diabetes Paternal Grandmother     Social History Social History   Tobacco Use   Smoking status: Every Day    Types: Cigarettes   Smokeless tobacco: Never  Vaping Use   Vaping Use: Never used  Substance Use Topics   Alcohol use: Yes   Drug use: Never    Review of Systems Constitutional: No  fever/chills Eyes: No visual changes. ENT: No sore throat. Cardiovascular: Denies chest pain. Respiratory: Denies shortness of breath. Gastrointestinal: No abdominal pain.   Skin: Negative for rash. Neurological: Negative for headaches or weakness.    ____________________________________________   PHYSICAL EXAM:  VITAL SIGNS: ED Triage Vitals  Enc Vitals Group     BP 08/20/21 1630 98/78     Pulse Rate 08/20/21 1630 85     Resp 08/20/21 1630 16     Temp 08/20/21 1630 97.9 F (36.6 C)     Temp Source 08/20/21 1630 Oral     SpO2 08/20/21 1630 100 %     Weight 08/20/21 1629 165 lb (74.8 kg)     Height 08/20/21 1629 5\' 10"  (1.778 m)     Head Circumference --      Peak Flow --      Pain Score 08/20/21 1629 0     Pain Loc --      Pain Edu? --      Excl. in GC? --     Constitutional: Alert and oriented.  Appears intoxicated.  Speech is somewhat slurred.  Demonstrates mild ataxia. Eyes: Conjunctivae are slightly injected but not jaundiced. Head: Atraumatic. Nose: No congestion/rhinnorhea. Mouth/Throat: Mucous membranes are lightly dry. Neck: No stridor.  Cardiovascular: Normal rate, regular rhythm. Grossly normal heart sounds.  Good peripheral circulation. Respiratory: Normal respiratory effort.  No retractions. Lungs CTAB. Gastrointestinal: Soft and nontender. No distention. Musculoskeletal: No lower extremity tenderness nor edema. Neurologic: Words very discernible, but slightly slurred.  He is alert to place and situation.  No gross focal neurologic deficits are appreciated.  No tremulousness.  No diaphoresis.  Moves all extremities well without difficulty. Skin:  Skin is warm, dry and intact. No rash noted.  Perhaps just trace edema of the ankles bilateral. Psychiatric: Mood and affect are calm, slightly somnolent and slight slurring of speech.  Denies desire to harm himself or anyone else.  Maintains alertness and contact with examiner throughout without needed tactile or  focused verbal stimulation.  ____________________________________________   LABS (all labs ordered are listed, but only abnormal results are displayed)  Labs Reviewed  ETHANOL - Abnormal; Notable for the following components:      Result Value   Alcohol, Ethyl (B) 438 (*)    All other components within normal limits  CBC WITH DIFFERENTIAL/PLATELET - Abnormal; Notable for the following components:   RBC 3.58 (*)    HCT 38.5 (*)    MCV 107.5 (*)    MCH 36.3 (*)    RDW 16.2 (*)    Basophils Absolute 0.2 (*)    Abs Immature Granulocytes 0.17 (*)    All other components within normal limits  COMPREHENSIVE METABOLIC PANEL - Abnormal; Notable for the following components:   CO2 33 (*)    AST 63 (*)    Alkaline Phosphatase 185 (*)    All other components within normal limits  ____________________________________________  EKG   ____________________________________________  RADIOLOGY   ____________________________________________   PROCEDURES  Procedure(s) performed: None  Procedures  Critical Care performed: No  ____________________________________________   INITIAL IMPRESSION / ASSESSMENT AND PLAN / ED COURSE  Pertinent labs & imaging results that were available during my care of the patient were reviewed by me and considered in my medical decision making (see chart for details).   Patient presents for concerns of alcohol abuse and dehydration.  He tells me that he feels dehydrated from heavy alcohol drinking.  He is alert but just slightly somnolent.  Does not endorse any acute psychiatric symptoms.  Does not show evidence of acute withdrawal.  Does appear slightly dehydrated.  Will insert IV provide hydration.  I do not see evidence of any confusion or indication of injury or trauma.  He denies any acute medical symptomatology.  Vital signs normal except for mild hypotension.  Labs are reviewed, normal anion gap.  Minimally elevated AST consistent with suspected  alcohol abuse.  Discussed with patient, he would like to receive hydration.  We will hydrate and observe.  Suspect once patient is less intoxicated he would likely be able to be discharged.  Does have notably elevated ethanol level, but given his level of alertness and orientation I suspect he is likely a heavy chronic alcohol user.  Clinical Course as of 08/21/21 0152  Sat Aug 20, 2021  2011 Patient resting comfortably. No distress. [MQ]    Clinical Course User Index [MQ] Sharyn Creamer, MD   Ongoing care assigned to Dr. Sidney Ace at 11:15 PM.  Follow-up on reassessments and reassessment of the patient.  Currently intoxicated awaiting patient to metabolize further, and reassess  ____________________________________________   FINAL CLINICAL IMPRESSION(S) / ED DIAGNOSES  Final diagnoses:  None   Acute alcohol intoxication     Note:  This document was prepared using Dragon voice recognition software and may include unintentional dictation errors       Sharyn Creamer, MD 08/21/21 0153

## 2021-08-20 NOTE — ED Provider Notes (Signed)
Emergency Medicine Provider Triage Evaluation Note  Joshua Medina , a 35 y.o. male  was evaluated in triage.  Escorted by the police.  Patient blew a 0.38.  They were trying to start papers.  Patient has been in triage sleeping and snoring.  Last time he was here his EtOH level was 424.  Patient has long history of EtOH abuse. Patient is unwilling to give review of systems/history.  Review of Systems  Positive: EtOH abuse intoxication Negative: Unable to assess  Physical Exam  There were no vitals taken for this visit. Gen:   Will wake when shaken, awakens when we started the IV fluids Resp:  Normal effort  MSK:   Moves extremities without difficulty  Other:  Positive EtOH odor  Medical Decision Making  Medically screening exam initiated at 4:24 PM.  Appropriate orders placed.  Jlyn Bracamonte was informed that the remainder of the evaluation will be completed by another provider, this initial triage assessment does not replace that evaluation, and the importance of remaining in the ED until their evaluation is complete.  We will give the patient fluids due to the high level on the breathalyzer per the police.  We will hopefully be able to go over to the main to be observed for a while and then discharged home.  Patient has long history of EtOH abuse and in the past does not want to detox.   Faythe Ghee, PA-C 08/20/21 1626    Merwyn Katos, MD 08/26/21 [phone]

## 2021-08-20 NOTE — ED Triage Notes (Signed)
Pt brought in by [employer], pt here for alcohol intoxication. Pt is intoxicated in triage, unable to answer questions at this time. Pt is alert.

## 2021-08-21 NOTE — ED Provider Notes (Signed)
Patient signed out to me at 11 PM.  Brought in by police, has a history of alcohol abuse.  Patient really not sure why he is here.  His EtOH level is over 400.  He is currently sleeping.  We will continue to observe.  Patient walking to bathroom with stable gait, tolerating p.o.  He is awake alert and oriented.  Has a ride home.  He is stable for discharge.   Georga Hacking, MD 08/21/21 [phone]

## 2021-08-21 NOTE — ED Notes (Signed)
Nutrition and PO fluids provided; patient tolerating well; MD Sidney Ace notified

## 2021-08-21 NOTE — ED Notes (Signed)
Patient ambulated to restroom with steady independent gait.

## 2021-09-15 NOTE — Congregational Nurse Program (Signed)
°  Dept: [phone]   Congregational Nurse Program Note  Date of Encounter: 09/15/2021 Client to clinic for nail care and vitral sign check. BP 98/88 (BP Location: Left Arm, Patient Position: Sitting, Cuff Size: Normal)    Pulse 93    SpO2 94% Comment: room air Has been away from clinic for a few weeks. Encouraged to return next week for continued BP monitoring. He has had appointments at the [facility] but has not followed through.  Support given.  Past Medical History: Past Medical History:  Diagnosis Date   Graves disease    Thyroid disease     Encounter Details:  CNP Questionnaire - 09/15/21 0928       Questionnaire   Do you give verbal consent to treat you today? Yes    Location Patient Served  [facility]    Visit Setting Church or Organization    Patient Status Homeless    Insurance Uninsured ([employer]/Self-Pay)    Insurance Referral N/A    Medication Have Medication Insecurities   client is currenbtly not taking medications   Medical Provider No   Client had an appointment at [facility] on 8/31, but was unable to attend. New appointment made today atr his request  for 11/15 at 2:30 , appointment will be at [facility]. Client aware and reminder card. Client did not attend the apt on 11/15   Screening Referrals N/A    Medical Referral Other;N/A    Medical Appointment Made N/A    Food Have Food Insecurities   client is homeless, eats at [facility] and other places   Transportation N/A   client uses the [employer] bus at this time   Housing/Utilities No permanent housing    Economist N/A   client is homeless and sleeping in a car or outside   Intervention Blood pressure;Support

## 2021-10-30 ENCOUNTER — Emergency Department
Admission: EM | Admit: 2021-10-30 | Discharge: 2021-10-30 | Disposition: A | Discharge status: Home / Self Care | Payer: Medicaid Other | Attending: Emergency Medicine | Admitting: Emergency Medicine

## 2021-10-30 DIAGNOSIS — T730XXA Starvation, initial encounter: Secondary | ICD-10-CM | POA: Insufficient documentation

## 2021-10-30 DIAGNOSIS — J00 Acute nasopharyngitis [common cold]: Secondary | ICD-10-CM | POA: Insufficient documentation

## 2021-10-30 DIAGNOSIS — E039 Hypothyroidism, unspecified: Secondary | ICD-10-CM | POA: Insufficient documentation

## 2021-10-30 DIAGNOSIS — Z5900 Homelessness unspecified: Secondary | ICD-10-CM | POA: Insufficient documentation

## 2021-10-30 NOTE — ED Triage Notes (Signed)
Pt homeless, cold and hungry , no other complaints , per emg bg 126 ?

## 2021-10-30 NOTE — ED Provider Notes (Signed)
? ?  [facility] ?Provider Note ? ? ? Event Date/Time  ? First MD Initiated Contact with Patient 10/30/21 [phone]   ?  (approximate) ? ? ?History  ? ?Chills (Pt homeless, cold and hungry , no other complaints ) ? ? ?HPI ? ?Joshua Medina is a 36 y.o. male with a history of alcohol use disorder, hypothyroidism who presents because he reports that he is homeless, cold and hungry.  Otherwise he has no acute medical complaints ?  ? ? ?Physical Exam  ? ?Triage Vital Signs: ?ED Triage Vitals  ?Enc Vitals Group  ?   BP 10/30/21 0722 108/67  ?   Pulse Rate 10/30/21 0722 81  ?   Resp 10/30/21 0722 17  ?   Temp 10/30/21 0722 97.9 ?F (36.6 ?C)  ?   Temp Source 10/30/21 0722 Oral  ?   SpO2 10/30/21 0722 98 %  ?   Weight 10/30/21 0723 75 kg (165 lb 5.5 oz)  ?   Height 10/30/21 0723 1.778 m (5\' 10" )  ?   Head Circumference --   ?   Peak Flow --   ?   Pain Score 10/30/21 0722 3  ?   Pain Loc --   ?   Pain Edu? --   ?   Excl. in GC? --   ? ? ?Most recent vital signs: ?Vitals:  ? 10/30/21 0722  ?BP: 108/67  ?Pulse: 81  ?Resp: 17  ?Temp: 97.9 ?F (36.6 ?C)  ?SpO2: 98%  ? ? ? ?General: Awake, no distress.  ?CV:  Good peripheral perfusion.  ?Resp:  Normal effort.  ?Abd:  No distention.  ?Other:   ? ? ?ED Results / Procedures / Treatments  ? ?Labs ?(all labs ordered are listed, but only abnormal results are displayed) ?Labs Reviewed - No data to display ? ? ?EKG ? ? ? ? ?RADIOLOGY ? ? ? ? ?PROCEDURES: ? ?Critical Care performed:  ?Procedures ? ? ?MEDICATIONS ORDERED IN ED: ?Medications - No data to display ? ? ?IMPRESSION / MDM / ASSESSMENT AND PLAN / ED COURSE  ?I reviewed the triage vital signs and the nursing notes. ? ?Patient is well-appearing with normal vital signs, exam is unremarkable.  He has no acute complaints, denies alcohol abuse or substance abuse ? ?Patient given food and coffee ? ?We will provide resources for homeless shelters. ? ?No indication for any testing here.   ? ? ? ? ? ?  ? ? ?FINAL CLINICAL  IMPRESSION(S) / ED DIAGNOSES  ? ?Final diagnoses:  ?Homelessness  ? ? ? ?Rx / DC Orders  ? ?ED Discharge Orders   ? ? None  ? ?  ? ? ? ?Note:  This document was prepared using Dragon voice recognition software and may include unintentional dictation errors. ?  ?12/30/21, MD ?10/30/21 0730 ? ?

## 2021-10-31 ENCOUNTER — Emergency Department: Payer: Medicaid Other

## 2021-10-31 ENCOUNTER — Other Ambulatory Visit: Payer: Self-pay

## 2021-10-31 ENCOUNTER — Emergency Department
Admission: EM | Admit: 2021-10-31 | Discharge: 2021-10-31 | Disposition: A | Discharge status: Home / Self Care | Payer: Medicaid Other | Attending: Emergency Medicine | Admitting: Emergency Medicine

## 2021-10-31 ENCOUNTER — Encounter: Payer: Self-pay | Admitting: Emergency Medicine

## 2021-10-31 DIAGNOSIS — Z5900 Homelessness unspecified: Secondary | ICD-10-CM

## 2021-10-31 DIAGNOSIS — G8929 Other chronic pain: Secondary | ICD-10-CM

## 2021-10-31 DIAGNOSIS — M25571 Pain in right ankle and joints of right foot: Secondary | ICD-10-CM | POA: Insufficient documentation

## 2021-10-31 MED ORDER — ONDANSETRON 4 MG PO TBDP
4.0000 mg | ORAL_TABLET | Freq: Once | ORAL | Status: AC
  Administered 2021-10-31: 4 mg via ORAL
  Filled 2021-10-31: qty 1

## 2021-10-31 MED ORDER — IBUPROFEN 800 MG PO TABS
800.0000 mg | ORAL_TABLET | Freq: Once | ORAL | Status: AC
  Administered 2021-10-31: 800 mg via ORAL
  Filled 2021-10-31: qty 1

## 2021-10-31 NOTE — ED Notes (Signed)
EDP at bedside to assess patient at this time.  

## 2021-10-31 NOTE — ED Notes (Signed)
After patient discharged, multiple nurses required to encourage patient to get out of bed, pt stating that he feels tired. Pt then standing over trash can and noted to be dry heaving repeatedly, eventually able to to vomit up minimal amounts into trash can. Approx 30 mins after discharge with multiple encouragements from staff to leave room due to patient being discharged and room being needed for other medical emergencies, pt ambulated out of room, asked where the bathroom was, pt can be heard dry heaving in the bathroom, per EDP, okay to give another dose of ODT Zofran due to patient making himself vomit.  ?

## 2021-10-31 NOTE — ED Notes (Signed)
Pt ambulatory without difficulty. 

## 2021-10-31 NOTE — ED Triage Notes (Signed)
Pt arrived via [facility] with reports of R ankle pain, pt is homeless, cold and hungry.  Per EMS, pt told them on the way that his ankle is not bothering him. ?

## 2021-10-31 NOTE — ED Notes (Signed)
Pt is out of the quad restroom and is currently sitting in a recliner 25Hall. Warm blankets provided.  ?
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
## 2021-10-31 NOTE — ED Notes (Signed)
Pt to treatment room 25, pt ambulatory without difficulty with slight limp noted at this time. Pt c/o R ankle pain x 3 days. Pt states hx of previous break to R ankle. Pt also c/o abd discomfort x 1 day, states has been "dry vomiting", states is "not actually producing anything". Pt with noted flat affect, NAD noted on arrival to room.  ?

## 2021-10-31 NOTE — ED Notes (Signed)
Pt sleeping in recliner. Pt was already discharged and would not leave and became combative with night staff. Will wake pt up now and discharge again. Pt already has all discharge paperwork. ?

## 2021-10-31 NOTE — Discharge Instructions (Signed)
You may alternate Tylenol 1000 mg every 6 hours as needed for pain, fever and Ibuprofen 800 mg every 6-8 hours as needed for pain, fever.  Please take Ibuprofen with food.  Do not take more than 4000 mg of Tylenol (acetaminophen) in a 24 hour period.  Steps to find a Primary Care Provider (PCP):  Call [phone] or [phone] to access "[facility] Find a Doctor Service."  2.  You may also go on the [facility] website at [email or web address]  

## 2021-10-31 NOTE — ED Notes (Signed)
Pt went back to sleep after reviewing discharge information. In room again to encourage pt to wake up, as he has been discharged. Charge nurse approved pt to sleep in lobby tonight, warm blankets provided. Pt requested sandwich tray. No sandwich trays available at present, but offered snacks/food that was available in ED. Pt sat on side of stretcher facing wall. When asked if he needed a w/c, pt continued to stare at the wall not speaking. Pt then stood up, holding hand over mouth, went over to trash can, and attempted to vomit. Charge nurse in to assist with pt.  ?

## 2021-11-05 ENCOUNTER — Emergency Department
Admission: EM | Admit: 2021-11-05 | Discharge: 2021-11-05 | Disposition: A | Discharge status: Home / Self Care | Payer: 59 | Attending: Emergency Medicine | Admitting: Emergency Medicine

## 2021-11-05 ENCOUNTER — Other Ambulatory Visit: Payer: Self-pay

## 2021-11-05 DIAGNOSIS — Z0000 Encounter for general adult medical examination without abnormal findings: Secondary | ICD-10-CM | POA: Insufficient documentation

## 2021-11-05 DIAGNOSIS — Z139 Encounter for screening, unspecified: Secondary | ICD-10-CM

## 2021-11-05 NOTE — ED Triage Notes (Signed)
Pt states he believes he has low iron and would like a check up. Pt appears in no acute distress. Skin normal color warm and dry, resps unlabored.  ?

## 2021-11-05 NOTE — ED Provider Notes (Addendum)
? ?  [facility] ?Provider Note ? ? ? Event Date/Time  ? First MD Initiated Contact with Patient 11/05/21 [phone]   ?  (approximate) ? ? ?History  ? ?Check Up ? ? ?HPI ? ?Joshua Medina is a 36 y.o. male who presents to the ED for evaluation of Check Up ?  ?I reviewed various ED visits for alcohol abuse and homelessness. ? ?Patient presents to the ED to get checked out.  He reports having a rough week.  Reports walking 20 miles today and his legs are swollen from this.  Denies any falls or injuries.  Reports he just wants his vitals taken and to make sure that he is okay.  Denies recent assault, falls or trauma.  Denies syncopal episodes or fevers.  Reports he went through withdrawals from alcohol last week.  Tolerating p.o. intake and toileting at his baseline. ? ?Physical Exam  ? ?Triage Vital Signs: ?ED Triage Vitals [11/05/21 0321]  ?Enc Vitals Group  ?   BP (!) 135/91  ?   Pulse Rate 100  ?   Resp 16  ?   Temp (!) 97.4 ?F (36.3 ?C)  ?   Temp Source Oral  ?   SpO2 98 %  ?   Weight 160 lb (72.6 kg)  ?   Height 5\' 10"  (1.778 m)  ?   Head Circumference   ?   Peak Flow   ?   Pain Score   ?   Pain Loc   ?   Pain Edu?   ?   Excl. in GC?   ? ? ?Most recent vital signs: ?Vitals:  ? 11/05/21 0321  ?BP: (!) 135/91  ?Pulse: 100  ?Resp: 16  ?Temp: (!) 97.4 ?F (36.3 ?C)  ?SpO2: 98%  ? ? ?General: Awake, no distress.  Ambulatory with normal gait. ?CV:  Good peripheral perfusion. RRR ?Resp:  Normal effort. CTAB ?Abd:  No distention. Soft and benign throughout ?MSK:  No deformity noted.  Trace pitting edema to bilateral lower extremities without overlying skin changes or signs of trauma. ?Neuro:  No focal deficits appreciated. Cranial nerves II through XII intact ?5/5 strength and sensation in all 4 extremities ?Other:   ? ? ?ED Results / Procedures / Treatments  ? ?Labs ?(all labs ordered are listed, but only abnormal results are displayed) ?Labs Reviewed - No data to display ? ?EKG ? ? ?RADIOLOGY ? ? ?Official  radiology report(s): ?No results found. ? ?PROCEDURES and INTERVENTIONS: ? ?Procedures ? ?Medications - No data to display ? ? ?IMPRESSION / MDM / ASSESSMENT AND PLAN / ED COURSE  ?I reviewed the triage vital signs and the nursing notes. ? ?36 year old male presents to the ED to get checked out without evidence of acute pathology and suitable for outpatient management.  Well-appearing with a normal examination.  No indications for diagnostics.  We discussed return precautions. ? ?  ? ? ?FINAL CLINICAL IMPRESSION(S) / ED DIAGNOSES  ? ?Final diagnoses:  ?Encounter for medical screening examination  ? ? ? ?Rx / DC Orders  ? ?ED Discharge Orders   ? ? None  ? ?  ? ? ? ?Note:  This document was prepared using Dragon voice recognition software and may include unintentional dictation errors. ?  ?30, MD ?11/05/21 0603 ? ?  ?0604, MD ?11/05/21 [id number] ? ?

## 2021-11-13 ENCOUNTER — Encounter: Payer: Self-pay | Admitting: Emergency Medicine

## 2021-11-13 ENCOUNTER — Other Ambulatory Visit: Payer: Self-pay

## 2021-11-13 ENCOUNTER — Emergency Department
Admission: EM | Admit: 2021-11-13 | Discharge: 2021-11-14 | Disposition: A | Discharge status: Home / Self Care | Payer: 59 | Attending: Emergency Medicine | Admitting: Emergency Medicine

## 2021-11-13 DIAGNOSIS — F10129 Alcohol abuse with intoxication, unspecified: Secondary | ICD-10-CM | POA: Diagnosis not present

## 2021-11-13 DIAGNOSIS — F10920 Alcohol use, unspecified with intoxication, uncomplicated: Secondary | ICD-10-CM

## 2021-11-13 DIAGNOSIS — Z5900 Homelessness unspecified: Secondary | ICD-10-CM | POA: Insufficient documentation

## 2021-11-13 DIAGNOSIS — Y908 Blood alcohol level of 240 mg/100 ml or more: Secondary | ICD-10-CM | POA: Diagnosis not present

## 2021-11-13 DIAGNOSIS — F1010 Alcohol abuse, uncomplicated: Secondary | ICD-10-CM

## 2021-11-13 LAB — CBC
HCT: 45.5 % (ref 39.0–52.0)
Hemoglobin: 14.8 g/dL (ref 13.0–17.0)
MCH: 33.3 pg (ref 26.0–34.0)
MCHC: 32.5 g/dL (ref 30.0–36.0)
MCV: 102.5 fL — ABNORMAL HIGH (ref 80.0–100.0)
Platelets: 147 10*3/uL — ABNORMAL LOW (ref 150–400)
RBC: 4.44 MIL/uL (ref 4.22–5.81)
RDW: 13.7 % (ref 11.5–15.5)
WBC: 7.4 10*3/uL (ref 4.0–10.5)
nRBC: 0 % (ref 0.0–0.2)

## 2021-11-13 LAB — COMPREHENSIVE METABOLIC PANEL
ALT: 23 U/L (ref 0–44)
AST: 55 U/L — ABNORMAL HIGH (ref 15–41)
Albumin: 4.7 g/dL (ref 3.5–5.0)
Alkaline Phosphatase: 108 U/L (ref 38–126)
Anion gap: 14 (ref 5–15)
BUN: 12 mg/dL (ref 6–20)
CO2: 30 mmol/L (ref 22–32)
Calcium: 8.9 mg/dL (ref 8.9–10.3)
Chloride: 102 mmol/L (ref 98–111)
Creatinine, Ser: 1.18 mg/dL (ref 0.61–1.24)
GFR, Estimated: >60 mL/min (ref >60)
Glucose, Bld: 103 mg/dL — ABNORMAL HIGH (ref 70–99)
Potassium: 3.7 mmol/L (ref 3.5–5.1)
Sodium: 146 mmol/L — ABNORMAL HIGH (ref 135–145)
Total Bilirubin: 0.7 mg/dL (ref 0.3–1.2)
Total Protein: 8.2 g/dL — ABNORMAL HIGH (ref 6.5–8.1)

## 2021-11-13 LAB — ETHANOL: Alcohol, Ethyl (B): 382 mg/dL (ref <10)

## 2021-11-13 MED ORDER — FOLIC ACID 1 MG PO TABS
1.0000 mg | ORAL_TABLET | Freq: Every day | ORAL | Status: DC
  Administered 2021-11-13: 1 mg via ORAL
  Filled 2021-11-13: qty 1

## 2021-11-13 MED ORDER — LORAZEPAM 1 MG PO TABS: 1.0000 mg | ORAL_TABLET | ORAL | Status: DC | PRN

## 2021-11-13 MED ORDER — ADULT MULTIVITAMIN W/MINERALS CH
1.0000 | ORAL_TABLET | Freq: Every day | ORAL | Status: DC
  Administered 2021-11-13: 1 via ORAL
  Filled 2021-11-13: qty 1

## 2021-11-13 MED ORDER — ONDANSETRON 4 MG PO TBDP
ORAL_TABLET | ORAL | Status: AC
  Filled 2021-11-13: qty 1

## 2021-11-13 MED ORDER — LORAZEPAM 2 MG/ML IJ SOLN: 1.0000 mg | INTRAMUSCULAR | Status: DC | PRN

## 2021-11-13 MED ORDER — THIAMINE HCL 100 MG PO TABS
100.0000 mg | ORAL_TABLET | Freq: Every day | ORAL | Status: DC
  Administered 2021-11-13: 100 mg via ORAL
  Filled 2021-11-13: qty 1

## 2021-11-13 MED ORDER — ONDANSETRON 4 MG PO TBDP
4.0000 mg | ORAL_TABLET | Freq: Once | ORAL | Status: AC
  Administered 2021-11-13: 4 mg via ORAL

## 2021-11-13 MED ORDER — THIAMINE HCL 100 MG/ML IJ SOLN: 100.0000 mg | Freq: Every day | INTRAMUSCULAR | Status: DC

## 2021-11-13 MED ORDER — NICOTINE 21 MG/24HR TD PT24
21.0000 mg | MEDICATED_PATCH | Freq: Once | TRANSDERMAL | Status: DC
  Administered 2021-11-13: 21 mg via TRANSDERMAL
  Filled 2021-11-13: qty 1

## 2021-11-13 NOTE — ED Provider Notes (Signed)
? ?[facility] ?Provider Note ? ? Event Date/Time  ? First MD Initiated Contact with Patient 11/13/21 1848   ?  (approximate) ?History  ?Alcohol Intoxication ? ?HPI ?Joshua Medina is a 37 y.o. male with stated past medical history of alcohol abuse and homelessness who presents for alcohol intoxication requesting to be "checked out and sober up".  Patient states that he was found by police and told that he could either go to jail or come to the hospital and the patient decided to get a medical evaluation.  Patient denies any complaints at this time and is refusing resources for rehabilitation. ?Physical Exam  ?Triage Vital Signs: ?ED Triage Vitals [11/13/21 1810]  ?Enc Vitals Group  ?   BP (!) 127/102  ?   Pulse Rate 96  ?   Resp 20  ?   Temp 98 ?F (36.7 ?C)  ?   Temp Source Oral  ?   SpO2 98 %  ?   Weight 160 lb (72.6 kg)  ?   Height 5\' 10"  (1.778 m)  ?   Head Circumference   ?   Peak Flow   ?   Pain Score 0  ?   Pain Loc   ?   Pain Edu?   ?   Excl. in GC?   ? ?Most recent vital signs: ?Vitals:  ? 11/13/21 2053 11/13/21 2303  ?BP: 117/90   ?Pulse: 81 67  ?Resp: 16   ?Temp: 98.3 ?F (36.8 ?C)   ?SpO2: 95% 98%  ? ?General: Awake, oriented x4. ?CV:  Good peripheral perfusion.  ?Resp:  Normal effort.  ?Abd:  No distention.  ?Other:  Middle-aged Caucasian male laying in bed in no distress ?ED Results / Procedures / Treatments  ?Labs ?(all labs ordered are listed, but only abnormal results are displayed) ?Labs Reviewed  ?COMPREHENSIVE METABOLIC PANEL - Abnormal; Notable for the following components:  ?    Result Value  ? Sodium 146 (*)   ? Glucose, Bld 103 (*)   ? Total Protein 8.2 (*)   ? AST 55 (*)   ? All other components within normal limits  ?ETHANOL - Abnormal; Notable for the following components:  ? Alcohol, Ethyl (B) 382 (*)   ? All other components within normal limits  ?CBC - Abnormal; Notable for the following components:  ? MCV 102.5 (*)   ? Platelets 147 (*)   ? All other components  within normal limits  ?URINE DRUG SCREEN, QUALITATIVE ([facility] ONLY)  ? ?PROCEDURES: ?Critical Care performed: No ?Procedures ?MEDICATIONS ORDERED IN ED: ?Medications  ?ondansetron (ZOFRAN-ODT) 4 MG disintegrating tablet (has no administration in time range)  ?nicotine (NICODERM CQ - dosed in mg/24 hours) patch 21 mg (21 mg Transdermal Patch Applied 11/13/21 2114)  ?LORazepam (ATIVAN) tablet 1-4 mg (has no administration in time range)  ?  Or  ?LORazepam (ATIVAN) injection 1-4 mg (has no administration in time range)  ?thiamine tablet 100 mg (100 mg Oral Given 11/13/21 2203)  ?  Or  ?thiamine (B-1) injection 100 mg ( Intravenous See Alternative 11/13/21 2203)  ?folic acid (FOLVITE) tablet 1 mg (1 mg Oral Given 11/13/21 2203)  ?multivitamin with minerals tablet 1 tablet (1 tablet Oral Given 11/13/21 2203)  ?ondansetron (ZOFRAN-ODT) disintegrating tablet 4 mg (4 mg Oral Given 11/13/21 1814)  ? ?IMPRESSION / MDM / ASSESSMENT AND PLAN / ED COURSE  ?I reviewed the triage vital signs and the nursing notes. ?             ?               ?  The patient is on the cardiac monitor to evaluate for evidence of arrhythmia and/or significant heart rate changes. ? ?Presents with mild altered mental status. ?+Slurred, sluggish behavior. ?Stated EtOH intoxication. ?Airway maintained. ?Unlikely intracranial bleed, opioid intoxication or coingestion, sepsis, hypothyroidism. ?Suspect likely transient course of intoxication with expected  improvement of symptoms as patient metabolizes offending agent. ? ?Plan: frequent reassessments ? ?Reassessment Note: ?Time: 3 hours since initial presentation. ?Evaluation: Frequent mental status exams showed improving symptoms and evidence that the patient?s AMS was secondary to intoxication. Pt able to ambulate without difficulty and PO tolerant. Plan DC home with ride and return precautions. ?Disposition: Pending resources for alcohol rehabilitation as well as homelessness ? ? ?  ?FINAL CLINICAL IMPRESSION(S)  / ED DIAGNOSES  ? ?Final diagnoses:  ?Alcoholic intoxication without complication (HCC)  ?Alcohol abuse  ?Homeless  ? ?Rx / DC Orders  ? ?ED Discharge Orders   ? ? None  ? ?  ? ?Note:  This document was prepared using Dragon voice recognition software and may include unintentional dictation errors. ?  ?Merwyn Katos, MD ?11/13/21 2320 ? ?

## 2021-11-13 NOTE — ED Triage Notes (Signed)
Pt via EMS from home. Pt c/o emesis and alcohol intoxication. Pt endorses last drink approx 12 hours ago. Pt is an everyday drinker. Denies any other drugs. Pt is A&OX4 and NAD. Pt is unsteady on his feet.  ?

## 2021-11-13 NOTE — ED Triage Notes (Signed)
Pt in via EMS from the bus stop at [place] with c/o intoxication. Pt reported that he drank too much and just wants to get checked out and sober up ? ?98% RA, 108/77, 78HR ?

## 2021-11-13 NOTE — ED Triage Notes (Signed)
Pt called for triage, outside smoking, will see front desk when he returns. ?

## 2021-11-14 NOTE — ED Provider Notes (Signed)
----------------------------------------- ?  5:30 AM on 11/14/2021 ?-----------------------------------------  ? ?Patient is awake, alert and ambulatory to the restroom with steady gait.  Denies SI/HI/AH/VH.  Voices no complaints.  Declines to wait for TTS or TOC for detox.  Will take the bus; states he has somewhere to go to stay.  Strict return precautions given.  Patient verbalizes understanding and agrees with plan of care. ?  ?Irean Hong, MD ?11/14/21 [phone] ? ?

## 2021-11-14 NOTE — Discharge Instructions (Signed)
Return to the ER for worsening symptoms, if you have feelings of hurting yourself or others, or other concerns. ?

## 2021-11-14 NOTE — ED Notes (Signed)
Pt continues to rest on stretcher, repositions self to position of comfort. No needs or c/o voiced at this time.  ?

## 2021-11-14 NOTE — ED Notes (Signed)
Pt ambulatory to bathroom at this time with steady gait. Asking when he may be released. Dr. Beather Arbour at bedside discussing plan of care at this time ?

## 2021-11-14 NOTE — ED Notes (Signed)
No changes in pt condition, continues to rest on stretcher. Repositions self to position of comfort. Resp even and unlabored. No needs voiced at this time ?

## 2021-11-15 ENCOUNTER — Emergency Department
Admission: EM | Admit: 2021-11-15 | Discharge: 2021-11-17 | Disposition: A | Discharge status: Home / Self Care | Payer: 59 | Attending: Emergency Medicine | Admitting: Emergency Medicine

## 2021-11-15 ENCOUNTER — Other Ambulatory Visit: Payer: Self-pay

## 2021-11-15 DIAGNOSIS — F10220 Alcohol dependence with intoxication, uncomplicated: Secondary | ICD-10-CM | POA: Insufficient documentation

## 2021-11-15 DIAGNOSIS — Y908 Blood alcohol level of 240 mg/100 ml or more: Secondary | ICD-10-CM | POA: Insufficient documentation

## 2021-11-15 DIAGNOSIS — Z5900 Homelessness unspecified: Secondary | ICD-10-CM | POA: Diagnosis not present

## 2021-11-15 DIAGNOSIS — F10129 Alcohol abuse with intoxication, unspecified: Secondary | ICD-10-CM | POA: Diagnosis present

## 2021-11-15 DIAGNOSIS — F10920 Alcohol use, unspecified with intoxication, uncomplicated: Secondary | ICD-10-CM

## 2021-11-15 DIAGNOSIS — E039 Hypothyroidism, unspecified: Secondary | ICD-10-CM | POA: Diagnosis not present

## 2021-11-15 DIAGNOSIS — Z20822 Contact with and (suspected) exposure to covid-19: Secondary | ICD-10-CM | POA: Insufficient documentation

## 2021-11-15 LAB — URINE DRUG SCREEN, QUALITATIVE (ARMC ONLY)
Amphetamines, Ur Screen: NOT DETECTED
Barbiturates, Ur Screen: NOT DETECTED
Benzodiazepine, Ur Scrn: NOT DETECTED
Cannabinoid 50 Ng, Ur ~~LOC~~: NOT DETECTED
Cocaine Metabolite,Ur ~~LOC~~: NOT DETECTED
MDMA (Ecstasy)Ur Screen: NOT DETECTED
Methadone Scn, Ur: NOT DETECTED
Opiate, Ur Screen: NOT DETECTED
Phencyclidine (PCP) Ur S: NOT DETECTED
Tricyclic, Ur Screen: NOT DETECTED

## 2021-11-15 LAB — URINALYSIS, ROUTINE W REFLEX MICROSCOPIC
Bacteria, UA: NONE SEEN
Bilirubin Urine: NEGATIVE
Glucose, UA: NEGATIVE mg/dL
Hgb urine dipstick: NEGATIVE
Ketones, ur: NEGATIVE mg/dL
Leukocytes,Ua: NEGATIVE
Nitrite: NEGATIVE
Protein, ur: 100 mg/dL — AB
Specific Gravity, Urine: 1.017 (ref 1.005–1.030)
pH: 6 (ref 5.0–8.0)

## 2021-11-15 LAB — COMPREHENSIVE METABOLIC PANEL
ALT: 29 U/L (ref 0–44)
AST: 88 U/L — ABNORMAL HIGH (ref 15–41)
Albumin: 4.7 g/dL (ref 3.5–5.0)
Alkaline Phosphatase: 123 U/L (ref 38–126)
Anion gap: 14 (ref 5–15)
BUN: 12 mg/dL (ref 6–20)
CO2: 30 mmol/L (ref 22–32)
Calcium: 9.1 mg/dL (ref 8.9–10.3)
Chloride: 95 mmol/L — ABNORMAL LOW (ref 98–111)
Creatinine, Ser: 1.18 mg/dL (ref 0.61–1.24)
GFR, Estimated: >60 mL/min (ref >60)
Glucose, Bld: 92 mg/dL (ref 70–99)
Potassium: 4.1 mmol/L (ref 3.5–5.1)
Sodium: 139 mmol/L (ref 135–145)
Total Bilirubin: 0.8 mg/dL (ref 0.3–1.2)
Total Protein: 8.5 g/dL — ABNORMAL HIGH (ref 6.5–8.1)

## 2021-11-15 LAB — RESP PANEL BY RT-PCR (FLU A&B, COVID) ARPGX2
Influenza A by PCR: NEGATIVE
Influenza B by PCR: NEGATIVE
SARS Coronavirus 2 by RT PCR: NEGATIVE

## 2021-11-15 LAB — CBC
HCT: 45.3 % (ref 39.0–52.0)
Hemoglobin: 15 g/dL (ref 13.0–17.0)
MCH: 33.6 pg (ref 26.0–34.0)
MCHC: 33.1 g/dL (ref 30.0–36.0)
MCV: 101.3 fL — ABNORMAL HIGH (ref 80.0–100.0)
Platelets: 141 10*3/uL — ABNORMAL LOW (ref 150–400)
RBC: 4.47 MIL/uL (ref 4.22–5.81)
RDW: 13.6 % (ref 11.5–15.5)
WBC: 7.5 10*3/uL (ref 4.0–10.5)
nRBC: 0 % (ref 0.0–0.2)

## 2021-11-15 LAB — ETHANOL: Alcohol, Ethyl (B): 433 mg/dL (ref <10)

## 2021-11-15 MED ORDER — THIAMINE HCL 100 MG PO TABS
100.0000 mg | ORAL_TABLET | Freq: Every day | ORAL | Status: DC
  Administered 2021-11-15 – 2021-11-17 (×3): 100 mg via ORAL
  Filled 2021-11-15 (×10): qty 1

## 2021-11-15 MED ORDER — LORAZEPAM 2 MG PO TABS
0.0000 mg | ORAL_TABLET | Freq: Four times a day (QID) | ORAL | Status: AC
  Administered 2021-11-15: 1 mg via ORAL
  Administered 2021-11-16: 2 mg via ORAL
  Filled 2021-11-15 (×2): qty 1

## 2021-11-15 NOTE — ED Notes (Signed)
Pt awakens easily. Denies any pain, denies any nausea, requested sprite. Resting with blanket over face to keep light out.  ?

## 2021-11-15 NOTE — ED Provider Notes (Signed)
? ?[facility] ?Provider Note ? ? ? Event Date/Time  ? First MD Initiated Contact with Patient 11/15/21 0542   ?  (approximate) ? ? ?History  ? ?Flank Pain and Alcohol Problem ? ? ?HPI ? ?Joshua Medina is a 36 y.o. male who returns to the ED due to homelessness, alcohol intoxication and dependency seeking detox.  Patient was seen for same 3/12, 3/13, 3/18, 11/13/2021.  On most recent visit patient awoke and declined detox and left.  Denies flank pain as stated to triage nurse and is not interested in having that evaluated.  Denies recent fall or trauma.  States he is a liquor drinker up to 1/5 daily.  History of DTs.  Denies active SI/HI/AH/VH.  Voices no medical complaints.  Last drink 1 hour prior to arrival. ?  ? ? ?Past Medical History  ? ?Past Medical History:  ?Diagnosis Date  ? Graves disease   ? Thyroid disease   ? ? ? ?Active Problem List  ? ?Patient Active Problem List  ? Diagnosis Date Noted  ? Altered mental status 01/11/2021  ? Hyponatremia 01/11/2021  ? Hypotension 06/10/2020  ? Hypoglycemia without diagnosis of diabetes mellitus 06/10/2020  ? Alcohol withdrawal seizure ([place]) 06/04/2020  ? Malnutrition of moderate degree 02/17/2020  ? Hypomagnesemia 02/16/2020  ? Delirium tremens ([place]) 02/16/2020  ? Alcohol use disorder, moderate, dependence ([place]) 02/15/2020  ? Hypothyroidism (acquired) 02/15/2020  ? Alcoholic liver disease ([place]) 02/15/2020  ? Alcohol withdrawal seizure with complication, with delirium ([place]) 02/15/2020  ? Alcohol withdrawal seizure, with delirium ([place]) 02/15/2020  ? Thrombocytopenia ([place]) 02/15/2020  ? Anemia 02/15/2020  ? Hypokalemia 02/15/2020  ? ? ? ?Past Surgical History  ? ?Past Surgical History:  ?Procedure Laterality Date  ? thyroid ablation with radioactive I-131    ? ? ? ?Home Medications  ? ?Prior to Admission medications   ?Medication Sig Start Date End Date Taking? Authorizing Provider  ?feeding supplement, ENSURE ENLIVE, (ENSURE ENLIVE) LIQD Take 237  mLs by mouth 2 (two) times daily between meals. ?Patient not taking: Reported on 06/13/2020 02/20/20   Modena Jansky, MD  ?folic acid (FOLVITE) 1 MG tablet Take 1 tablet (1 mg total) by mouth daily. ?Patient not taking: Reported on 08/20/2021 01/17/21   Gwynne Edinger, MD  ?lactulose (CHRONULAC) 10 GM/15ML solution Take 30 mLs (20 g total) by mouth daily. ?Patient not taking: Reported on 08/20/2021 01/17/21   Gwynne Edinger, MD  ?levothyroxine (SYNTHROID) 100 MCG tablet Take 1 tablet (100 mcg total) by mouth daily. 01/17/21 02/16/21  Wouk, Ailene Rud, MD  ?magnesium 30 MG tablet Take 1 tablet (30 mg total) by mouth 2 (two) times daily. ?Patient not taking: Reported on 01/11/2021 06/11/20   Sharen Hones, MD  ?magnesium oxide (MAG-OX) 400 (240 Mg) MG tablet Take 1 tablet (400 mg total) by mouth 2 (two) times daily. ?Patient not taking: Reported on 08/20/2021 01/17/21   Gwynne Edinger, MD  ?Multiple Vitamins-Minerals (MULTIVITAMIN WITH MINERALS) tablet Take 1 tablet by mouth daily. ?Patient not taking: Reported on 11/13/2021 01/17/21 01/17/22  Gwynne Edinger, MD  ?nicotine (NICODERM CQ - DOSED IN MG/24 HOURS) 21 mg/24hr patch Place 1 patch (21 mg total) onto the skin daily. ?Patient not taking: Reported on 06/05/2020 02/21/20   Modena Jansky, MD  ?pantoprazole (PROTONIX) 40 MG tablet Take 1 tablet (40 mg total) by mouth daily. ?Patient not taking: Reported on 08/20/2021 01/18/21   Gwynne Edinger, MD  ?thiamine 100 MG tablet  Take 1 tablet (100 mg total) by mouth daily. ?Patient not taking: Reported on 08/20/2021 01/17/21   Gwynne Edinger, MD  ? ? ? ?Allergies  ?Precedex [dexmedetomidine hcl in nacl] ? ? ?Family History  ? ?Family History  ?Problem Relation Age of Onset  ? Diabetes Paternal Grandmother   ? ? ? ?Physical Exam  ?Triage Vital Signs: ?ED Triage Vitals  ?Enc Vitals Group  ?   BP 11/15/21 0143 108/89  ?   Pulse Rate 11/15/21 0143 68  ?   Resp 11/15/21 0143 16  ?   Temp 11/15/21 0143 (!) 97.5  ?F (36.4 ?C)  ?   Temp Source 11/15/21 0143 Oral  ?   SpO2 11/15/21 0143 99 %  ?   Weight --   ?   Height --   ?   Head Circumference --   ?   Peak Flow --   ?   Pain Score 11/15/21 0146 4  ?   Pain Loc --   ?   Pain Edu? --   ?   Excl. in [place]? --   ? ? ?Updated Vital Signs: ?BP 108/89   Pulse 68   Temp (!) 97.5 ?F (36.4 ?C) (Oral)   Resp 16   SpO2 99%  ? ? ?General: Awake, no distress.  Intoxicated. ?CV:  RRR.  Good peripheral perfusion.  ?Resp:  Normal effort.  CTA B. ?Abd:  Nontender.  No distention.  ?Other:  Not tremulous.  No asterixis.   ? ?ED Results / Procedures / Treatments  ?Labs ?(all labs ordered are listed, but only abnormal results are displayed) ?Labs Reviewed  ?COMPREHENSIVE METABOLIC PANEL - Abnormal; Notable for the following components:  ?    Result Value  ? Chloride 95 (*)   ? Total Protein 8.5 (*)   ? AST 88 (*)   ? All other components within normal limits  ?ETHANOL - Abnormal; Notable for the following components:  ? Alcohol, Ethyl (B) 433 (*)   ? All other components within normal limits  ?CBC - Abnormal; Notable for the following components:  ? MCV 101.3 (*)   ? Platelets 141 (*)   ? All other components within normal limits  ?URINE DRUG SCREEN, QUALITATIVE ([facility] ONLY)  ?URINALYSIS, ROUTINE W REFLEX MICROSCOPIC  ? ? ? ?EKG ? ?None ? ? ?RADIOLOGY ?None ? ? ?Official radiology report(s): ?No results found. ? ? ?PROCEDURES: ? ?Critical Care performed: No ? ?Procedures ? ? ?MEDICATIONS ORDERED IN ED: ?Medications  ?LORazepam (ATIVAN) tablet 0-4 mg (has no administration in time range)  ?thiamine tablet 100 mg (has no administration in time range)  ? ? ? ?IMPRESSION / MDM / ASSESSMENT AND PLAN / ED COURSE  ?I reviewed the triage vital signs and the nursing notes. ?             ?               ?36 year old homeless male who returns seeking alcohol detox.  Elevated EtOH level.  Will place on oral CIWA scale.  Will consult TTS to evaluate patient in the ED. ? ?FINAL CLINICAL IMPRESSION(S) / ED  DIAGNOSES  ? ?Final diagnoses:  ?Alcoholic intoxication without complication ([place])  ?Alcohol dependence with uncomplicated intoxication ([place])  ? ? ? ?Rx / DC Orders  ? ?ED Discharge Orders   ? ? None  ? ?  ? ? ? ?Note:  This document was prepared using Dragon voice recognition software and may include unintentional dictation  errors. ?  ?Paulette Blanch, MD ?11/15/21 [phone] ? ?

## 2021-11-15 NOTE — ED Notes (Signed)
Pt resting in bed, easy to awaken, denies any pain or nausea. Slight tingling in feet with shoes on. Declined removing shoes. Pt denies any needs at this time.  ?

## 2021-11-15 NOTE — BH Assessment (Signed)
Detox  Referral information for detox treatment faxed to:   [facility] ([phone] or [phone])   [facility] ([phone])   [facility] ([phone])   [facility] ([phone])  . [facility] ([phone]) (Walk-in only)  . [facility] ([phone])   [facility] ([phone])  . [facility] ([phone])  . [facility] ([phone])  . [facility] ([phone]) 

## 2021-11-15 NOTE — ED Notes (Signed)
Meal tray provided to pt. Pt denies any pain or nausea at this time.  ?

## 2021-11-15 NOTE — ED Notes (Signed)
Pt provided sandwich tray and soda ? ?

## 2021-11-15 NOTE — BH Assessment (Addendum)
Referral checks: ?  ?? [facility] [phone] or [phone]) Per Mia, there are no beds available at this time. ?  ?? [facility] ([phone]) Per Molli Hazard, not accepting pts at this time due to facility moving locations. ?  ?? [facility] [phone]) Maxine Glenn agreed to call back. ?  ?? [facility] ([phone]) Per Velna Hatchet, no beds available at this time. Advised to send for review in the morning. Task completed at 9:15 PM.  ?  ?. [facility] [phone]) (Walk-in only) Silvio Pate requested a refax. Task completed at 8:30 PM. ?  ?. [facility] [phone]) Per Homero Fellers, pt denied due to financial. ?  ?? [facility] [phone]) Unable to reach intake staff due to it being after business hours. ?  ?. [facility] [phone]) No answer ?  ?. [facility] [phone]) Per Cordelia Pen, there are no beds available at this time.  ?  ?. [facility] ([phone]) No answer. ?

## 2021-11-15 NOTE — BH Assessment (Signed)
Comprehensive Clinical Assessment (CCA) Note ? ?11/15/2021 ?Joshua Medina ?[id number] ?Recommendations for Services/Supports/Treatments: Pt to be referred out for inpatient detox substance abuse treatment. ? ?Joshua Medina is a 36 year old., White or Caucasian, Non-Hispanic, English speaking male with no known previous mental health history. Pt does have a hx of alcohol abuse and has a hx of alcohol use disorder, liver disease, and alcoholic withdrawal seizures. Per triage note, pt. to ED via [facility] from due to alcohol intoxication. Pt explained that he drinks approximately 1 pt to 1/5th of liquor daily; last use was on 11/14/21. Pt explained that he smokes cannabis sporadically, but it is not his thing, denying all other street drugs. Pt was in the action stage of change, expressing motivation to achieve sobriety/treatment.  Pt reported that he is not connected to a therapist/psychiatrist and he does not take mental health medications. Pt is currently homeless and does not have supportive family. Pt identified a few friends in his support system. Pt reported that he has never been in detox, however his longest period of sobriety being when he was incarcerated for 7 years. On assessment, pt. is expansive with good insight into the severity of his substance abuse problem. Pt motivated for treatment, explaining that if he doesn't stop he will die. Thought processes were relevant and intact. Pt had impaired judgement; however, he had realistic reality testing. Pt did not appear to be responding to internal/external stimuli. Pt was oriented x4. Pt's mood was irritable and affect was responsive. Pt would laugh inappropriately or perhaps nervously at assessment questions. Pt denied current SI/HI/AV/H. Pt's BAL is 433. Pt's UDS is + for benzos.  ? ? ?Chief Complaint:  ?Chief Complaint  ?Patient presents with  ? Flank Pain  ? Alcohol Problem  ? ?Visit Diagnosis: Alcohol use disorder, severe dependence ? ? ?CCA Screening, Triage  and Referral ([facility]) ? ?Patient Reported Information ?How did you hear about [place]? Other (Comment) ([facility]) ? ?Referral name: No data recorded ?Referral phone number: No data recorded ? ?Whom do you see for routine medical problems? No data recorded ?Practice/Facility Name: No data recorded ?Practice/Facility Phone Number: No data recorded ?Name of Contact: No data recorded ?Contact Number: No data recorded ?Contact Fax Number: No data recorded ?Prescriber Name: No data recorded ?Prescriber Address (if known): No data recorded ? ?What Is the Reason for Your Visit/Call Today? Pt presents to ER via [facility] c/o left side flank pain that started 2 days ago.  Pt also states he wants detox help for his alcohol problem.  Pt is a daily drinker with last drink appx 1 hr ago. ? ?How Long Has This Been Causing You Problems? > than 6 months ? ?What Do You Feel Would Help You the Most Today? Alcohol or Drug Use Treatment ? ? ?Have You Recently Been in Any Inpatient Treatment (Hospital/Detox/Crisis Center/28-Day Program)? No data recorded ?Name/Location of Program/Hospital:No data recorded ?How Long Were You There? No data recorded ?When Were You Discharged? No data recorded ? ?Have You Ever Received Services From [employer] Before? No data recorded ?Who Do You See at [facility]? No data recorded ? ?Have You Recently Had Any Thoughts About Hurting Yourself? No ? ?Are You Planning to Commit Suicide/Harm Yourself At This time? No ? ? ?Have you Recently Had Thoughts About [place]? No ? ?Explanation: No data recorded ? ?Have You Used Any Alcohol or Drugs in the Past 24 Hours? No ? ?How Long Ago Did You Use Drugs or Alcohol? No data recorded ?  What Did You Use and How Much? No data recorded ? ?Do You Currently Have a Therapist/Psychiatrist? No ? ?Name of Therapist/Psychiatrist: No data recorded ? ?Have You Been Recently Discharged From Any Office Practice or Programs? No ? ?Explanation of Discharge From Practice/Program: No  data recorded ? ?  ?CCA Screening Triage Referral Assessment ?Type of Contact: Face-to-Face ? ?Is this Initial or Reassessment? No data recorded ?Date Telepsych consult ordered in [facility]:  No data recorded ?Time Telepsych consult ordered in CHL:  No data recorded ? ?Patient Reported Information Reviewed? No data recorded ?Patient Left Without Being Seen? No data recorded ?Reason for Not Completing Assessment: No data recorded ? ?Collateral Involvement: None provided ? ? ?Does Patient Have a Stage manager Guardian? No data recorded ?Name and Contact of Legal Guardian: No data recorded ?If Minor and Not Living with Parent(s), Who has Custody? n/a ? ?Is CPS involved or ever been involved? Never ? ?Is APS involved or ever been involved? Never ? ? ?Patient Determined To Be At Risk for Harm To Self or Others Based on Review of Patient Reported Information or Presenting Complaint? No ? ?Method: No data recorded ?Availability of Means: No data recorded ?Intent: No data recorded ?Notification Required: No data recorded ?Additional Information for Danger to Others Potential: No data recorded ?Additional Comments for Danger to Others Potential: No data recorded ?Are There Guns or Other Weapons in [place]? No data recorded ?Types of Guns/Weapons: No data recorded ?Are These Weapons Safely Secured?                            No data recorded ?Who Could Verify You Are Able To Have These Secured: No data recorded ?Do You Have any Outstanding Charges, Pending Court Dates, Parole/Probation? No data recorded ?Contacted To Inform of Risk of Harm To Self or Others: No data recorded ? ?Location of Assessment: [facility] ED ? ? ?Does Patient Present under Involuntary Commitment? No ? ?IVC Papers Initial File Date: No data recorded ? ?[place] of Residence: [place] ? ? ?Patient Currently Receiving the Following Services: Not Receiving Services ? ? ?Determination of Need: Emergent (2 hours) ? ? ?Options For Referral: Inpatient  Hospitalization; Therapeutic Triage Services ? ? ? ? ?CCA Biopsychosocial ?Intake/Chief Complaint:  No data recorded ?Current Symptoms/Problems: No data recorded ? ?Patient Reported Schizophrenia/Schizoaffective Diagnosis in Past: No ? ? ?Strengths: Pt is able to ask for help ? ?Preferences: No data recorded ?Abilities: No data recorded ? ?Type of Services Patient Feels are Needed: No data recorded ? ?Initial Clinical Notes/Concerns: No data recorded ? ?Mental Health Symptoms ?Depression:   ?None ?  ?Duration of Depressive symptoms: No data recorded  ?Mania:   ?None ?  ?Anxiety:    ?N/A ?  ?Psychosis:   ?None ?  ?Duration of Psychotic symptoms: No data recorded  ?Trauma:   ?N/A ?  ?Obsessions:   ?N/A ?  ?Compulsions:   ?"Driven" to perform behaviors/acts; Intended to reduce stress or prevent another outcome; Disrupts with routine/functioning; Intrusive/time consuming; Repeated behaviors/mental acts; Good insight ?  ?Inattention:   ?N/A ?  ?Hyperactivity/Impulsivity:   ?N/A ?  ?Oppositional/Defiant Behaviors:   ?None ?  ?Emotional Irregularity:   ?None ?  ?Other Mood/Personality Symptoms:  No data recorded  ? ?Mental Status Exam ?Appearance and self-care  ?Stature:   ?Average ?  ?Weight:   ?Average weight ?  ?Clothing:   ?Disheveled ?  ?Grooming:   ?Neglected ?  ?Cosmetic  use:   ?None ?  ?Posture/gait:   ?Normal ?  ?Motor activity:   ?Slowed ?  ?Sensorium  ?Attention:   ?Normal ?  ?Concentration:   ?Normal ?  ?Orientation:   ?Object; Person; Place; Situation ?  ?Recall/memory:   ?Normal ?  ?Affect and Mood  ?Affect:   ?Flat ?  ?Mood:   ?Irritable ?  ?Relating  ?Eye contact:   ?Avoided ?  ?Facial expression:   ?Responsive ?  ?Attitude toward examiner:   ?Cooperative; Irritable; Silly ?  ?Thought and Language  ?Speech flow:  ?Slurred; Soft ?  ?Thought content:   ?Appropriate to Mood and Circumstances ?  ?Preoccupation:   ?None ?  ?Hallucinations:   ?None ?  ?Organization:  No data recorded  ?Executive Functions  ?Fund  of Knowledge:   ?Average ?  ?Intelligence:   ?Average ?  ?Abstraction:   ?Normal ?  ?Judgement:   ?Impaired ?  ?Reality Testing:   ?Realistic ?  ?Insight:   ?Good ?  ?Decision Making:   ?Vacilates ?  ?Social Function

## 2021-11-15 NOTE — ED Triage Notes (Signed)
Pt presents to ER via [facility] c/o left side flank pain that started 2 days ago.  Pt also states he wants detox help for his alcohol problem.  Pt is a daily drinker with last drink appx 1 hr ago.  Pt speaking slowly in triage.  Pt otherwise A&O x4 at this time in NAD.   ?

## 2021-11-15 NOTE — ED Notes (Signed)
TTS at the bedside. 

## 2021-11-16 MED ORDER — LEVOTHYROXINE SODIUM 50 MCG PO TABS
100.0000 ug | ORAL_TABLET | Freq: Every day | ORAL | Status: DC
  Administered 2021-11-16 – 2021-11-17 (×2): 100 ug via ORAL
  Filled 2021-11-16 (×9): qty 2

## 2021-11-16 MED ORDER — FOLIC ACID 1 MG PO TABS
1.0000 mg | ORAL_TABLET | Freq: Every day | ORAL | Status: DC
  Administered 2021-11-16 – 2021-11-17 (×2): 1 mg via ORAL
  Filled 2021-11-16 (×2): qty 1

## 2021-11-16 MED ORDER — PANTOPRAZOLE SODIUM 40 MG PO TBEC
40.0000 mg | DELAYED_RELEASE_TABLET | Freq: Every day | ORAL | Status: DC
  Administered 2021-11-16 – 2021-11-17 (×2): 40 mg via ORAL
  Filled 2021-11-16 (×9): qty 1

## 2021-11-16 MED ORDER — ADULT MULTIVITAMIN W/MINERALS CH
1.0000 | ORAL_TABLET | Freq: Every day | ORAL | Status: DC
  Administered 2021-11-16 – 2021-11-17 (×2): 1 via ORAL
  Filled 2021-11-16 (×2): qty 1

## 2021-11-16 MED ORDER — ONDANSETRON 4 MG PO TBDP
4.0000 mg | ORAL_TABLET | Freq: Three times a day (TID) | ORAL | Status: DC | PRN
Start: 2021-11-16 — End: 2021-11-17
  Administered 2021-11-16: 4 mg via ORAL

## 2021-11-16 MED ORDER — ONDANSETRON 4 MG PO TBDP
ORAL_TABLET | ORAL | Status: AC
  Filled 2021-11-16: qty 1

## 2021-11-16 MED ORDER — LEVOTHYROXINE SODIUM 50 MCG PO TABS: 100.0000 ug | ORAL_TABLET | Freq: Every day | ORAL | Status: DC

## 2021-11-16 NOTE — BH Assessment (Signed)
Writer spoke with Molly Maduro, RN at [phone] [phone]. Patient is under review pending labs (liver) and call back from patient's nurse, Mardella Layman.  ?

## 2021-11-16 NOTE — BH Assessment (Signed)
TTS received call from [facility]. Unfortunately, they are not able to accept patient due to his insurance.  ?

## 2021-11-16 NOTE — ED Notes (Addendum)
7 day supply of medications given to this RN by case manager RN to be sent with patient tomorrow 11/17/21 to [facility]. Meds will be passed off to next shift RN for discharge tomorrow. ?

## 2021-11-16 NOTE — ED Notes (Signed)
Patient resting with eyes closed in stretcher at this time. Respirations even and unlabored. NAD noted. ?

## 2021-11-16 NOTE — BH Assessment (Signed)
Patient has been accepted to [facility] on tomm morning 11/17/21 at 8:30am.  ?Representative was [employer].  ? ?ER Staff is aware of it:  ?Misty Stanley, Museum/gallery curator  ?Dr. Sidney Ace, ER MD  ?Mardella Layman, Patient's Nurse ?    ?Please have patient ready in the waiting room at 8:30am. [facility] will pick up patient.   ? ?PATIENT MUST TAKE 7-DAY SUPPLY OF ALL MEDS.  ?

## 2021-11-16 NOTE — ED Notes (Signed)
Patient laying on stretcher with blanket over face. Respirations even and unlabored. NAD noted. ?

## 2021-11-17 NOTE — ED Notes (Signed)
Hospital meal provided.  100% consumed, pt tolerated w/o complaints.  Waste discarded appropriately.   

## 2021-11-17 NOTE — ED Provider Notes (Signed)
Emergency Medicine Observation Re-evaluation Note ? ?Joshua Medina is a 36 y.o. male, seen on rounds today.  Pt initially presented to the ED for complaints of Flank Pain and Alcohol Problem ?Currently, the patient is sleeping. ? ?Physical Exam  ?BP 108/75   Pulse 67   Temp 97.9 ?F (36.6 ?C)   Resp 20   SpO2 98%  ?Physical Exam ?Gen: No acute distress  ?Resp: Normal rise and fall of chest ?Neuro: Moving all four extremities ?Psych: Resting currently, calm and cooperative when awake ? ? ? ?ED Course / MDM  ?EKG:  ? ?I have reviewed the labs performed to date as well as medications administered while in observation.  Recent changes in the last 24 hours include no acute events overnight. ? ?Plan  ?Current plan is for [facility] placement for detox/rehab. ? Joshua Medina is not under involuntary commitment. ? ? ?  ?Joshua Medina, Layla Maw, DO ?11/17/21 3903 ? ?

## 2021-11-17 NOTE — ED Notes (Signed)
Discharged to [facility] transport. Left with all of belongings and discharge paperwork. 1 week of medications requested by [facility] and given to previous RN by Case Management discharged with patient as well. ?

## 2021-12-26 ENCOUNTER — Emergency Department
Admission: EM | Admit: 2021-12-26 | Discharge: 2021-12-28 | Disposition: A | Discharge status: Home / Self Care | Payer: 59 | Attending: Emergency Medicine | Admitting: Emergency Medicine

## 2021-12-26 ENCOUNTER — Other Ambulatory Visit: Payer: Self-pay

## 2021-12-26 DIAGNOSIS — F10230 Alcohol dependence with withdrawal, uncomplicated: Secondary | ICD-10-CM | POA: Diagnosis not present

## 2021-12-26 DIAGNOSIS — Y902 Blood alcohol level of 40-59 mg/100 ml: Secondary | ICD-10-CM | POA: Insufficient documentation

## 2021-12-26 DIAGNOSIS — F1020 Alcohol dependence, uncomplicated: Secondary | ICD-10-CM | POA: Diagnosis present

## 2021-12-26 DIAGNOSIS — F10930 Alcohol use, unspecified with withdrawal, uncomplicated: Secondary | ICD-10-CM

## 2021-12-26 LAB — CBC WITH DIFFERENTIAL/PLATELET
Abs Immature Granulocytes: 0.03 10*3/uL (ref 0.00–0.07)
Basophils Absolute: 0.1 10*3/uL (ref 0.0–0.1)
Basophils Relative: 2 %
Eosinophils Absolute: 0.1 10*3/uL (ref 0.0–0.5)
Eosinophils Relative: 1 %
HCT: 43.4 % (ref 39.0–52.0)
Hemoglobin: 14.3 g/dL (ref 13.0–17.0)
Immature Granulocytes: 1 %
Lymphocytes Relative: 31 %
Lymphs Abs: 2 10*3/uL (ref 0.7–4.0)
MCH: 33.8 pg (ref 26.0–34.0)
MCHC: 32.9 g/dL (ref 30.0–36.0)
MCV: 102.6 fL — ABNORMAL HIGH (ref 80.0–100.0)
Monocytes Absolute: 0.5 10*3/uL (ref 0.1–1.0)
Monocytes Relative: 7 %
Neutro Abs: 3.9 10*3/uL (ref 1.7–7.7)
Neutrophils Relative %: 58 %
Platelets: 137 10*3/uL — ABNORMAL LOW (ref 150–400)
RBC: 4.23 MIL/uL (ref 4.22–5.81)
RDW: 15.5 % (ref 11.5–15.5)
WBC: 6.6 10*3/uL (ref 4.0–10.5)
nRBC: 0 % (ref 0.0–0.2)

## 2021-12-26 LAB — ACETAMINOPHEN LEVEL: Acetaminophen (Tylenol), Serum: <10 ug/mL — ABNORMAL LOW (ref 10–30)

## 2021-12-26 LAB — COMPREHENSIVE METABOLIC PANEL
ALT: 21 U/L (ref 0–44)
AST: 47 U/L — ABNORMAL HIGH (ref 15–41)
Albumin: 4.5 g/dL (ref 3.5–5.0)
Alkaline Phosphatase: 78 U/L (ref 38–126)
Anion gap: 13 (ref 5–15)
BUN: 11 mg/dL (ref 6–20)
CO2: 26 mmol/L (ref 22–32)
Calcium: 9.1 mg/dL (ref 8.9–10.3)
Chloride: 101 mmol/L (ref 98–111)
Creatinine, Ser: 1.21 mg/dL (ref 0.61–1.24)
GFR, Estimated: >60 mL/min (ref >60)
Glucose, Bld: 82 mg/dL (ref 70–99)
Potassium: 4 mmol/L (ref 3.5–5.1)
Sodium: 140 mmol/L (ref 135–145)
Total Bilirubin: 0.7 mg/dL (ref 0.3–1.2)
Total Protein: 7.6 g/dL (ref 6.5–8.1)

## 2021-12-26 LAB — SALICYLATE LEVEL: Salicylate Lvl: <7 mg/dL — ABNORMAL LOW (ref 7.0–30.0)

## 2021-12-26 LAB — ETHANOL: Alcohol, Ethyl (B): 41 mg/dL — ABNORMAL HIGH (ref <10)

## 2021-12-26 MED ORDER — LORAZEPAM 2 MG/ML IJ SOLN: 0.0000 mg | Freq: Four times a day (QID) | INTRAMUSCULAR | Status: DC

## 2021-12-26 MED ORDER — LORAZEPAM 2 MG/ML IJ SOLN: 0.0000 mg | Freq: Two times a day (BID) | INTRAMUSCULAR | Status: DC

## 2021-12-26 MED ORDER — LORAZEPAM 2 MG/ML IJ SOLN: 1.0000 mg | Freq: Once | INTRAMUSCULAR | Status: DC

## 2021-12-26 MED ORDER — SODIUM CHLORIDE 0.9 % IV SOLN: INTRAVENOUS | Status: DC

## 2021-12-26 MED ORDER — LORAZEPAM 2 MG PO TABS: 0.0000 mg | ORAL_TABLET | Freq: Two times a day (BID) | ORAL | Status: DC

## 2021-12-26 MED ORDER — THIAMINE HCL 100 MG/ML IJ SOLN: 100.0000 mg | Freq: Every day | INTRAMUSCULAR | Status: DC

## 2021-12-26 MED ORDER — LORAZEPAM 2 MG/ML IJ SOLN
1.0000 mg | Freq: Once | INTRAMUSCULAR | Status: DC
Start: 2021-12-26 — End: 2021-12-26
  Filled 2021-12-26: qty 1

## 2021-12-26 MED ORDER — THIAMINE HCL 100 MG PO TABS
100.0000 mg | ORAL_TABLET | Freq: Every day | ORAL | Status: DC
  Administered 2021-12-26 – 2021-12-27 (×2): 100 mg via ORAL
  Filled 2021-12-26 (×2): qty 1

## 2021-12-26 MED ORDER — LORAZEPAM 2 MG PO TABS
2.0000 mg | ORAL_TABLET | Freq: Once | ORAL | Status: AC
  Administered 2021-12-26: 2 mg via ORAL
  Filled 2021-12-26: qty 1

## 2021-12-26 MED ORDER — LORAZEPAM 2 MG PO TABS
0.0000 mg | ORAL_TABLET | Freq: Four times a day (QID) | ORAL | Status: DC
  Administered 2021-12-26: 2 mg via ORAL
  Administered 2021-12-27 (×2): 1 mg via ORAL
  Filled 2021-12-26 (×3): qty 1

## 2021-12-26 NOTE — BH Assessment (Signed)
Referral information for Detox/Substance abuse treatment faxed to;  ? ?[facility] [phone]),  ? ?[facility] [phone]) ? ?[facility] [phone] or [phone]) ? ?[facility] [phone]) ? ?[facility] [phone])   ? ?[facility] [phone]) ?

## 2021-12-26 NOTE — ED Notes (Signed)
Patient improved after oral Ativan with barely perceptible sweats. Pt advised to notify staff if medication needed/symptoms worsen outside of CIWA screenings. ?

## 2021-12-26 NOTE — ED Notes (Signed)
Patient resting in bed with eyes closed. IV fluids running. Will continue to monitor.  ?

## 2021-12-26 NOTE — ED Triage Notes (Signed)
To ED via POV requesting detox from alcohol. Seeking inpatient detox treatment. Drinks a fifth of liquor daily. Last drink last night. Denies drug use. Cigarette used 0.5 back. Detox approx 3 months ago. ?Nauseated only. ?

## 2021-12-26 NOTE — ED Notes (Signed)
VOLUNTARY/awaiting TTS consult ?
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
## 2021-12-26 NOTE — ED Provider Notes (Signed)
? ?[facility] ?Provider Note ? ? ? Event Date/Time  ? First MD Initiated Contact with Patient 12/26/21 1330   ?  (approximate) ? ? ?History  ? ?Detox ? ? ?HPI ? ?Victor Langenbach is a 36 y.o. male who comes in asking for detox.  He has a history of previous DTs.  He said last night he drank a half a gallon of vodka.  That was the last time he drank.  He is now feeling a little shaky. ? ?  ? ? ?Physical Exam  ? ?Triage Vital Signs: ?ED Triage Vitals  ?Enc Vitals Group  ?   BP 12/26/21 1057 112/81  ?   Pulse Rate 12/26/21 1057 87  ?   Resp 12/26/21 1057 14  ?   Temp 12/26/21 1057 97.7 ?F (36.5 ?C)  ?   Temp Source 12/26/21 1057 Oral  ?   SpO2 12/26/21 1057 98 %  ?   Weight 12/26/21 1058 155 lb (70.3 kg)  ?   Height 12/26/21 1058 5\' 10"  (1.778 m)  ?   Head Circumference --   ?   Peak Flow --   ?   Pain Score 12/26/21 1058 0  ?   Pain Loc --   ?   Pain Edu? --   ?   Excl. in GC? --   ? ? ?Most recent vital signs: ?Vitals:  ? 12/26/21 1514 12/26/21 1635  ?BP: 112/79 112/79  ?Pulse: 65 65  ?Resp:  16  ?Temp:    ?SpO2:  100%  ? ? ? ?General: Awake, no distress.  Patient is a little sweaty and shaky. ?CV:  Good peripheral perfusion.  Heart regular rate and rhythm no audible murmurs ?Resp:  Normal effort.  Lungs are clear ?Abd:  No distention.  Soft nontender ?Extremities with no edema   ? ? ?ED Results / Procedures / Treatments  ? ?Labs ?(all labs ordered are listed, but only abnormal results are displayed) ?Labs Reviewed  ?COMPREHENSIVE METABOLIC PANEL - Abnormal; Notable for the following components:  ?    Result Value  ? AST 47 (*)   ? All other components within normal limits  ?ETHANOL - Abnormal; Notable for the following components:  ? Alcohol, Ethyl (B) 41 (*)   ? All other components within normal limits  ?ACETAMINOPHEN LEVEL - Abnormal; Notable for the following components:  ? Acetaminophen (Tylenol), Serum <10 (*)   ? All other components within normal limits  ?CBC WITH DIFFERENTIAL/PLATELET -  Abnormal; Notable for the following components:  ? MCV 102.6 (*)   ? Platelets 137 (*)   ? All other components within normal limits  ?SALICYLATE LEVEL - Abnormal; Notable for the following components:  ? Salicylate Lvl <7.0 (*)   ? All other components within normal limits  ? ? ? ?EKG ? ? ? ? ?RADIOLOGY ? ? ? ?PROCEDURES: ? ?Critical Care performed:  ?Procedures ? ? ?MEDICATIONS ORDERED IN ED: ?Medications  ?LORazepam (ATIVAN) tablet 2 mg (2 mg Oral Given 12/26/21 1514)  ? ? ? ?IMPRESSION / MDM / ASSESSMENT AND PLAN / ED COURSE  ?I reviewed the triage vital signs and the nursing notes. ?Patient has a history of DTs.  His ethanol level slightly only is a little bit shaky.  I have ordered some Ativan for him.  Ordered CIWA for him.  I will have TTS see him and see if we can get him in the [facility] for detox possibly we will need to admit  him here for detox but I will try that first.  Currently he is stable but he will need close watching to make sure he does not get into DTs. ? ?I have ordered a liter of normal saline with 100 mg of thiamine and 1 mg of folate and 1 amp of multivitamins to be infused at 150 mL an hour for this gentleman. ? ? ? ?FINAL CLINICAL IMPRESSION(S) / ED DIAGNOSES  ? ?Final diagnoses:  ?Alcohol withdrawal syndrome without complication (HCC)  ? ? ? ?Rx / DC Orders  ? ?ED Discharge Orders   ? ? None  ? ?  ? ? ? ?Note:  This document was prepared using Dragon voice recognition software and may include unintentional dictation errors. ?  ?Arnaldo Natal, MD ?12/26/21 1656 ? ?

## 2021-12-27 MED ORDER — FOLIC ACID 1 MG PO TABS: 1.0000 mg | ORAL_TABLET | Freq: Every day | ORAL | Status: DC

## 2021-12-27 MED ORDER — ADULT MULTIVITAMIN W/MINERALS CH: 1.0000 | ORAL_TABLET | Freq: Every day | ORAL | Status: DC

## 2021-12-27 MED ORDER — SODIUM CHLORIDE 0.9 % IV SOLN: INTRAVENOUS | Status: DC

## 2021-12-27 MED ORDER — PANTOPRAZOLE SODIUM 40 MG PO TBEC: 40.0000 mg | DELAYED_RELEASE_TABLET | Freq: Every day | ORAL | Status: DC

## 2021-12-27 NOTE — ED Notes (Signed)
Report received from Amy, RN including SBAR. Patient alert and oriented, warm and dry, and in no acute distress. Patient denies SI, HI, AVH and pain. Patient made aware of Q15 minute rounds and Rover and Officer presence for their safety. Patient instructed to come to this nurse with needs or concerns.  °

## 2021-12-27 NOTE — BH Assessment (Signed)
Referral Checks:  ?  ?[facility] [phone]) Unable to reach staff after business hours. ?  ?[facility] [phone] or [phone]) Per Mia, there are no beds available. ?  ?[facility] [phone]) Left a voicemail. ?  ?[facility] [phone])  No answer ?  ?[facility] [phone]) No answer ?

## 2021-12-27 NOTE — ED Notes (Signed)
VOLUNTARY/awaiting detox placement 

## 2021-12-28 NOTE — ED Provider Notes (Signed)
Emergency Medicine Observation Re-evaluation Note ? ?Joshua Medina is a 36 y.o. male, seen on rounds today.  Pt initially presented to the ED for complaints of Detox ? ? ?Physical Exam  ?BP 101/77 (BP Location: Left Arm)   Pulse 68   Temp 98.5 ?F (36.9 ?C) (Oral)   Resp 18   Ht 5\' 10"  (1.778 m)   Wt 70.3 kg   SpO2 99%   BMI 22.24 kg/m?  ?Physical Exam ?General: no acute distress ?Psych: calm ? ?ED Course / MDM  ?EKG:  ? ?I have reviewed the labs performed to date as well as medications administered while in observation.  Recent changes in the last 24 hours include none. ? ?Plan  ?Current plan is for social work dispo to rehab. ? Joshua Medina is not under involuntary commitment. ? ? ?  ?Joshua Starch, MD ?12/28/21 [phone] ? ?

## 2021-12-28 NOTE — ED Notes (Signed)
VOL/Pending Placement 

## 2021-12-28 NOTE — ED Provider Notes (Signed)
I was informed by RN around 7 AM the patient decided he longer wished to wait emergency room to be placed in rehab.  He is feeling much better.  He has not been tachycardic hypertensive or tachypneic and he has no significant tremor.  Do not believe requires further medical management of possible withdrawal.  Discussed concern for alcohol abuse and recommendation for close outpatient follow-up with [facility].  He is agreeable to plan.  He has no other acute concerns.  Discharged in stable condition. ?  ?Gilles Chiquito, MD ?12/28/21 [phone] ? ?

## 2022-01-04 ENCOUNTER — Encounter: Payer: Self-pay | Admitting: Emergency Medicine

## 2022-01-04 ENCOUNTER — Emergency Department
Admission: EM | Admit: 2022-01-04 | Discharge: 2022-01-04 | Disposition: A | Discharge status: Home / Self Care | Payer: 59 | Attending: Emergency Medicine | Admitting: Emergency Medicine

## 2022-01-04 DIAGNOSIS — R1110 Vomiting, unspecified: Secondary | ICD-10-CM | POA: Diagnosis present

## 2022-01-04 DIAGNOSIS — E119 Type 2 diabetes mellitus without complications: Secondary | ICD-10-CM | POA: Diagnosis not present

## 2022-01-04 DIAGNOSIS — Z79899 Other long term (current) drug therapy: Secondary | ICD-10-CM | POA: Insufficient documentation

## 2022-01-04 DIAGNOSIS — F10120 Alcohol abuse with intoxication, uncomplicated: Secondary | ICD-10-CM | POA: Insufficient documentation

## 2022-01-04 DIAGNOSIS — F1010 Alcohol abuse, uncomplicated: Secondary | ICD-10-CM

## 2022-01-04 DIAGNOSIS — F10920 Alcohol use, unspecified with intoxication, uncomplicated: Secondary | ICD-10-CM

## 2022-01-04 DIAGNOSIS — E039 Hypothyroidism, unspecified: Secondary | ICD-10-CM | POA: Diagnosis not present

## 2022-01-04 HISTORY — DX: Type 2 diabetes mellitus without complications: E11.9

## 2022-01-04 LAB — COMPREHENSIVE METABOLIC PANEL
ALT: 44 U/L (ref 0–44)
AST: 100 U/L — ABNORMAL HIGH (ref 15–41)
Albumin: 4.6 g/dL (ref 3.5–5.0)
Alkaline Phosphatase: 81 U/L (ref 38–126)
Anion gap: 12 (ref 5–15)
BUN: 7 mg/dL (ref 6–20)
CO2: 27 mmol/L (ref 22–32)
Calcium: 9 mg/dL (ref 8.9–10.3)
Chloride: 103 mmol/L (ref 98–111)
Creatinine, Ser: 1.09 mg/dL (ref 0.61–1.24)
GFR, Estimated: >60 mL/min (ref >60)
Glucose, Bld: 88 mg/dL (ref 70–99)
Potassium: 3.4 mmol/L — ABNORMAL LOW (ref 3.5–5.1)
Sodium: 142 mmol/L (ref 135–145)
Total Bilirubin: 1 mg/dL (ref 0.3–1.2)
Total Protein: 7.5 g/dL (ref 6.5–8.1)

## 2022-01-04 LAB — CBC
HCT: 45.1 % (ref 39.0–52.0)
Hemoglobin: 14.7 g/dL (ref 13.0–17.0)
MCH: 32.8 pg (ref 26.0–34.0)
MCHC: 32.6 g/dL (ref 30.0–36.0)
MCV: 100.7 fL — ABNORMAL HIGH (ref 80.0–100.0)
Platelets: 86 10*3/uL — ABNORMAL LOW (ref 150–400)
RBC: 4.48 MIL/uL (ref 4.22–5.81)
RDW: 15.6 % — ABNORMAL HIGH (ref 11.5–15.5)
WBC: 6.7 10*3/uL (ref 4.0–10.5)
nRBC: 0 % (ref 0.0–0.2)

## 2022-01-04 LAB — ETHANOL: Alcohol, Ethyl (B): 421 mg/dL (ref <10)

## 2022-01-04 MED ORDER — ADULT MULTIVITAMIN W/MINERALS CH
1.0000 | ORAL_TABLET | Freq: Once | ORAL | Status: AC
  Administered 2022-01-04: 1 via ORAL
  Filled 2022-01-04: qty 1

## 2022-01-04 MED ORDER — ONDANSETRON HCL 4 MG/2ML IJ SOLN
4.0000 mg | Freq: Once | INTRAMUSCULAR | Status: AC
  Administered 2022-01-04: 4 mg via INTRAVENOUS
  Filled 2022-01-04: qty 2

## 2022-01-04 MED ORDER — THIAMINE HCL 100 MG PO TABS: 100.0000 mg | ORAL_TABLET | Freq: Every day | ORAL | Status: DC

## 2022-01-04 MED ORDER — FOLIC ACID 1 MG PO TABS
1.0000 mg | ORAL_TABLET | Freq: Once | ORAL | Status: AC
  Administered 2022-01-04: 1 mg via ORAL
  Filled 2022-01-04: qty 1

## 2022-01-04 MED ORDER — LORAZEPAM 2 MG/ML IJ SOLN
0.0000 mg | Freq: Four times a day (QID) | INTRAMUSCULAR | Status: DC
  Administered 2022-01-04: 2 mg via INTRAVENOUS
  Filled 2022-01-04: qty 1

## 2022-01-04 MED ORDER — SODIUM CHLORIDE 0.9 % IV BOLUS
1000.0000 mL | Freq: Once | INTRAVENOUS | Status: AC
  Administered 2022-01-04: 1000 mL via INTRAVENOUS

## 2022-01-04 MED ORDER — THIAMINE HCL 100 MG PO TABS
100.0000 mg | ORAL_TABLET | Freq: Once | ORAL | Status: AC
  Administered 2022-01-04: 100 mg via ORAL
  Filled 2022-01-04: qty 1

## 2022-01-04 MED ORDER — THIAMINE HCL 100 MG/ML IJ SOLN: Freq: Once | INTRAVENOUS | Status: DC

## 2022-01-04 NOTE — ED Notes (Signed)
MD aware of pt's BP.  Will continue to monitor patient and assess vitals. ?

## 2022-01-04 NOTE — ED Triage Notes (Signed)
Pt presents via EMS - pt was found in [facility] lobby intoxicated and vomiting and requested to come to the ED to "sleep off" his intoxication. Pt states he consumed "too much vodka". CBG with EMS was 88; VSS. ?

## 2022-01-04 NOTE — ED Notes (Signed)
TTS discussed resources with pt, pt denies he needs any help at this time.  ?

## 2022-01-04 NOTE — ED Notes (Addendum)
D/C and reasons to return to ED discussed with pt, pt verbalized understanding. Pt ambulatory with steady gait on D/C.  ? ?Pt also educated on where the bus stop is and the times it is running, pt also given water and [place] sandwich tray for D/C. ? ?Pt refusing D/C vitals.  ?

## 2022-01-04 NOTE — ED Provider Notes (Signed)
? ?[facility] ?Provider Note ? ? ? Event Date/Time  ? First MD Initiated Contact with Patient 01/04/22 0108   ?  (approximate) ? ? ?History  ? ?Alcohol Intoxication ? ? ?HPI ? ?Joshua Medina is a 36 y.o. male brought to the ED via EMS from police department for alcohol intoxication and requesting to detox.  History of DTs.  Denies active SI/HI/AH/VH.  Vomiting.  Voices no other complaints or injuries. ?  ? ? ?Past Medical History  ? ?Past Medical History:  ?Diagnosis Date  ? Diabetes mellitus without complication (HCC)   ? Graves disease   ? Thyroid disease   ? ? ? ?Active Problem List  ? ?Patient Active Problem List  ? Diagnosis Date Noted  ? Altered mental status 01/11/2021  ? Hyponatremia 01/11/2021  ? Hypotension 06/10/2020  ? Hypoglycemia without diagnosis of diabetes mellitus 06/10/2020  ? Alcohol withdrawal seizure (HCC) 06/04/2020  ? Malnutrition of moderate degree 02/17/2020  ? Hypomagnesemia 02/16/2020  ? Delirium tremens (HCC) 02/16/2020  ? Alcohol use disorder, moderate, dependence (HCC) 02/15/2020  ? Hypothyroidism (acquired) 02/15/2020  ? Alcoholic liver disease (HCC) 02/15/2020  ? Alcohol withdrawal seizure with complication, with delirium (HCC) 02/15/2020  ? Alcohol withdrawal seizure, with delirium (HCC) 02/15/2020  ? Thrombocytopenia (HCC) 02/15/2020  ? Anemia 02/15/2020  ? Hypokalemia 02/15/2020  ? ? ? ?Past Surgical History  ? ?Past Surgical History:  ?Procedure Laterality Date  ? thyroid ablation with radioactive I-131    ? ? ? ?Home Medications  ? ?Prior to Admission medications   ?Medication Sig Start Date End Date Taking? Authorizing Provider  ?feeding supplement, ENSURE ENLIVE, (ENSURE ENLIVE) LIQD Take 237 mLs by mouth 2 (two) times daily between meals. ?Patient not taking: Reported on 06/13/2020 02/20/20   Elease Etienne, MD  ?folic acid (FOLVITE) 1 MG tablet Take 1 tablet (1 mg total) by mouth daily. ?Patient not taking: Reported on 08/20/2021 01/17/21   Kathrynn Running, MD  ?lactulose (CHRONULAC) 10 GM/15ML solution Take 30 mLs (20 g total) by mouth daily. ?Patient not taking: Reported on 08/20/2021 01/17/21   Kathrynn Running, MD  ?levothyroxine (SYNTHROID) 100 MCG tablet Take 1 tablet (100 mcg total) by mouth daily. 01/17/21 02/16/21  Wouk, Wilfred Curtis, MD  ?magnesium 30 MG tablet Take 1 tablet (30 mg total) by mouth 2 (two) times daily. ?Patient not taking: Reported on 01/11/2021 06/11/20   Marrion Coy, MD  ?magnesium oxide (MAG-OX) 400 (240 Mg) MG tablet Take 1 tablet (400 mg total) by mouth 2 (two) times daily. ?Patient not taking: Reported on 08/20/2021 01/17/21   Kathrynn Running, MD  ?Multiple Vitamins-Minerals (MULTIVITAMIN WITH MINERALS) tablet Take 1 tablet by mouth daily. ?Patient not taking: Reported on 11/13/2021 01/17/21 01/17/22  Kathrynn Running, MD  ?nicotine (NICODERM CQ - DOSED IN MG/24 HOURS) 21 mg/24hr patch Place 1 patch (21 mg total) onto the skin daily. ?Patient not taking: Reported on 06/05/2020 02/21/20   Elease Etienne, MD  ?pantoprazole (PROTONIX) 40 MG tablet Take 1 tablet (40 mg total) by mouth daily. ?Patient not taking: Reported on 08/20/2021 01/18/21   Kathrynn Running, MD  ?thiamine 100 MG tablet Take 1 tablet (100 mg total) by mouth daily. ?Patient not taking: Reported on 08/20/2021 01/17/21   Kathrynn Running, MD  ? ? ? ?Allergies  ?Precedex [dexmedetomidine hcl in nacl] ? ? ?Family History  ? ?Family History  ?Problem Relation Age of Onset  ? Diabetes Paternal Grandmother   ? ? ? ?  Physical Exam  ?Triage Vital Signs: ?ED Triage Vitals [01/04/22 0108]  ?Enc Vitals Group  ?   BP   ?   Pulse   ?   Resp   ?   Temp   ?   Temp src   ?   SpO2   ?   Weight 163 lb 5.8 oz (74.1 kg)  ?   Height 5\' 10"  (1.778 m)  ?   Head Circumference   ?   Peak Flow   ?   Pain Score   ?   Pain Loc   ?   Pain Edu?   ?   Excl. in GC?   ? ? ?Updated Vital Signs: ?BP 93/64 (BP Location: Left Arm)   Pulse 71   Temp (!) 97.5 ?F (36.4 ?C) (Oral)   Resp 14   Ht 5'  10" (1.778 m)   Wt 74.1 kg   SpO2 98%   BMI 23.44 kg/m?  ? ? ?General: Awake, no distress.  ?CV:  RRR.  Good peripheral perfusion.  ?Resp:  Normal effort.  CTA B. ?Abd:  Nontender.  No distention.  ?Other:  Bilateral conjunctivae reddened.  Intoxicated.  Alert and oriented x3. MAEx4.  Ambulated from ambulance to treatment bed. ? ? ?ED Results / Procedures / Treatments  ?Labs ?(all labs ordered are listed, but only abnormal results are displayed) ?Labs Reviewed  ?ETHANOL - Abnormal; Notable for the following components:  ?    Result Value  ? Alcohol, Ethyl (B) 421 (*)   ? All other components within normal limits  ?CBC - Abnormal; Notable for the following components:  ? MCV 100.7 (*)   ? RDW 15.6 (*)   ? Platelets 86 (*)   ? All other components within normal limits  ?COMPREHENSIVE METABOLIC PANEL - Abnormal; Notable for the following components:  ? Potassium 3.4 (*)   ? AST 100 (*)   ? All other components within normal limits  ? ? ? ?EKG ? ?None ? ? ?RADIOLOGY ?None ? ? ?Official radiology report(s): ?No results found. ? ? ?PROCEDURES: ? ?Critical Care performed: No ? ?Procedures ? ? ?MEDICATIONS ORDERED IN ED: ?Medications  ?thiamine tablet 100 mg (has no administration in time range)  ?LORazepam (ATIVAN) injection 0-4 mg (0 mg Intravenous Hold 01/04/22 0620)  ?sodium chloride 0.9 % bolus 1,000 mL (0 mLs Intravenous Stopped 01/04/22 0310)  ?ondansetron [facility]) injection 4 mg (4 mg Intravenous Given 01/04/22 0121)  ?multivitamin with minerals tablet 1 tablet (1 tablet Oral Given 01/04/22 0121)  ?folic acid (FOLVITE) tablet 1 mg (1 mg Oral Given 01/04/22 0121)  ?thiamine tablet 100 mg (100 mg Oral Given 01/04/22 0121)  ?sodium chloride 0.9 % bolus 1,000 mL (0 mLs Intravenous Stopped 01/04/22 0428)  ? ? ? ?IMPRESSION / MDM / ASSESSMENT AND PLAN / ED COURSE  ?I reviewed the triage vital signs and the nursing notes. ?             ?               ?36 year old male with alcohol dependency seeking detox.  I have identified  that the patient has a potential life-threatening illness.  Differential diagnosis includes alcoholism, metabolic derangement, mental health issues, homelessness, etc.  I have personally reviewed patient's records and see that he was seen in the ED on 12/26/2021 for same. ? ?We will check basic lab work, initiate IV fluid resuscitation.  Administer banana bag, placed on CIWA scale.  Consult TTS  for assistance with detox. ? ?Clinical Course as of 01/04/22 0653  ?Wed Jan 04, 2022  ?0321 Patient unable to participate in TTS interview due to intoxication.  TTS will try again in the morning. [JS]  ?[phone] Patient remains in the ED on CIWA protocol awaiting sobriety to speak with TTS regarding detox. [JS]  ?  ?Clinical Course User Index ?[JS] Irean HongSung, Raford Brissett J, MD  ? ? ? ?FINAL CLINICAL IMPRESSION(S) / ED DIAGNOSES  ? ?Final diagnoses:  ?Alcoholic intoxication without complication (HCC)  ?Alcohol abuse  ? ? ? ?Rx / DC Orders  ? ?ED Discharge Orders   ? ? None  ? ?  ? ? ? ?Note:  This document was prepared using Dragon voice recognition software and may include unintentional dictation errors. ?  ?Irean HongSung, Claudia Greenley J, MD ?01/04/22 [phone] ? ?

## 2022-01-04 NOTE — ED Provider Notes (Signed)
Patient had not wanted any detox currently.  He just wanted to feel better after drinking way too much alcohol.  He is now up and around in the ER.  He is clinically sober.  We will let him go. ?  ?Nena Polio, MD ?01/04/22 1746 ? ?

## 2022-01-04 NOTE — ED Notes (Signed)
Pt awake and alert, pt ambulatory to restroom with steady gait, pt able to drink water with no issues at this time.  ?

## 2022-01-04 NOTE — Discharge Instructions (Signed)
Please be careful with the alcohol.  People get alcohol overdose.  Additionally prolonged ingestion of alcohol will cause kind of liver problems and heart problems etc.  Alcoholics Anonymous is probably the best thing we have to help you get off of alcohol.  Also if you want any sort of detox you can return and ask for it and we can get you hooked up with that. ?

## 2022-01-04 NOTE — BH Assessment (Signed)
This writer attempted to assess patient twice but patient was unable to be aroused. TTS to follow ?

## 2022-01-04 NOTE — BH Assessment (Signed)
Comprehensive Clinical Assessment (CCA) Screening, Triage and Referral Note ? ?01/04/2022 ?Joshua Medina ?[id number] ? ?Joshua Medina, 36 year old male who presents to [facility] ED voluntarily for treatment. Per triage note, Pt presents via EMS - pt was found in [employer] lobby intoxicated and vomiting and requested to come to the ED to "sleep off" his intoxication. Pt states he consumed "too much vodka".   ? ?During TTS assessment pt presents alert and oriented x 4, restless but cooperative, and mood-congruent with affect. The pt does not appear to be responding to internal or external stimuli. Neither is the pt presenting with any delusional thinking. Pt verified the information provided to triage RN.  ? ?Pt identifies his main complaint to be that he had too much to drink. Patient reports he drinks 1/5 of vodka daily but on this occasion, he drank ? gallon. Patient states he is not interested in detox or rehab. Patient reports he is currently unemployed and homeless. Patient states he has no familial support and rarely sees his son. Patient denies using any other illicit substances. Patient is not taking any medications and is not receiving services for social support. Writer encouraged patient to seek help with local facilities that will be able to provide basic living needs. Patient verbalized understanding. Pt denies current SI/HI/AH/VH. ?I just needed to rest and sleep off the liquor. I am ready to go.? Pt contracts for safety.  ? ?Writer spoke with Emergency Room Provider and both agreed patient does not meet criteria for inpatient psychiatric admission. ? ?Chief Complaint:  ?Chief Complaint  ?Patient presents with  ? Alcohol Intoxication  ? ?Visit Diagnosis: Alcohol use disorder ? ?Patient Reported Information ?How did you hear about [place]? Self ? ?What Is the Reason for Your Visit/Call Today? Patient comes to ED after drinking 1/2 gallon of vodka. ? ?How Long Has This Been Causing You Problems? > than 6 months ? ?What Do  You Feel Would Help You the Most Today? -- (Assessment only) ? ? ?Have You Recently Had Any Thoughts About Hurting Yourself? No ? ?Are You Planning to Commit Suicide/Harm Yourself At This time? No ? ? ?Have you Recently Had Thoughts About Hurting Someone Karolee Ohs? No ? ?Are You Planning to Harm Someone at This Time? No ? ?Explanation: No data recorded ? ?Have You Used Any Alcohol or Drugs in the Past 24 Hours? Yes ? ?How Long Ago Did You Use Drugs or Alcohol? No data recorded ?What Did You Use and How Much? 1/2 gallon of vodka ? ? ?Do You Currently Have a Therapist/Psychiatrist? No ? ?Name of Therapist/Psychiatrist: No data recorded ? ?Have You Been Recently Discharged From Any Office Practice or Programs? No ? ?Explanation of Discharge From Practice/Program: No data recorded ?  ?CCA Screening Triage Referral Assessment ?Type of Contact: Face-to-Face ? ?Telemedicine Service Delivery:   ?Is this Initial or Reassessment? No data recorded ?Date Telepsych consult ordered in [facility]:  No data recorded ?Time Telepsych consult ordered in [facility]:  No data recorded ?Location of Assessment: [facility] ED ? ?Provider Location: [facility] ED ? ? ?Collateral Involvement: None provided ? ? ?Does Patient Have a Automotive engineer Guardian? No data recorded ?Name and Contact of Legal Guardian: No data recorded ?If Minor and Not Living with Parent(s), Who has Custody? n/a ? ?Is CPS involved or ever been involved? Never ? ?Is APS involved or ever been involved? Never ? ? ?Patient Determined To Be At Risk for Harm To Self or Others Based on Review of Patient  Reported Information or Presenting Complaint? No ? ?Method: No data recorded ?Availability of Means: No data recorded ?Intent: No data recorded ?Notification Required: No data recorded ?Additional Information for Danger to Others Potential: No data recorded ?Additional Comments for Danger to Others Potential: No data recorded ?Are There Guns or Other Weapons in Your Home? No data recorded ?Types of  Guns/Weapons: No data recorded ?Are These Weapons Safely Secured?                            No data recorded ?Who Could Verify You Are Able To Have These Secured: No data recorded ?Do You Have any Outstanding Charges, Pending Court Dates, Parole/Probation? No data recorded ?Contacted To Inform of Risk of Harm To Self or Others: No data recorded ? ?Does Patient Present under Involuntary Commitment? No ? ?IVC Papers Initial File Date: No data recorded ? ?[place] of Residence: [place] ? ? ?Patient Currently Receiving the Following Services: Not Receiving Services ? ? ?Determination of Need: Emergent (2 hours) ? ? ?Options For Referral: ED Visit; Therapeutic Triage Services ? ? ?Discharge Disposition:  ?  ? ?Joshua Medina Dierdre Searles, Counselor, LCAS-A ? ? ?  ?  ?  ? ? ?

## 2022-01-04 NOTE — ED Notes (Signed)
Pt resting calm and comfortably at this time. Normal rise and fall of chest. NAD noted. Call bell in reach.  

## 2022-04-06 ENCOUNTER — Encounter: Payer: Self-pay | Admitting: *Deleted

## 2022-04-06 ENCOUNTER — Other Ambulatory Visit: Payer: Self-pay

## 2022-04-06 ENCOUNTER — Emergency Department
Admission: EM | Admit: 2022-04-06 | Discharge: 2022-04-07 | Disposition: A | Discharge status: Home / Self Care | Payer: BLUE CROSS/BLUE SHIELD | Attending: Emergency Medicine | Admitting: Emergency Medicine

## 2022-04-06 DIAGNOSIS — E039 Hypothyroidism, unspecified: Secondary | ICD-10-CM | POA: Insufficient documentation

## 2022-04-06 DIAGNOSIS — Y908 Blood alcohol level of 240 mg/100 ml or more: Secondary | ICD-10-CM | POA: Diagnosis not present

## 2022-04-06 DIAGNOSIS — E876 Hypokalemia: Secondary | ICD-10-CM | POA: Diagnosis not present

## 2022-04-06 DIAGNOSIS — R4182 Altered mental status, unspecified: Secondary | ICD-10-CM | POA: Diagnosis not present

## 2022-04-06 DIAGNOSIS — F1010 Alcohol abuse, uncomplicated: Secondary | ICD-10-CM

## 2022-04-06 DIAGNOSIS — F10120 Alcohol abuse with intoxication, uncomplicated: Secondary | ICD-10-CM | POA: Insufficient documentation

## 2022-04-06 DIAGNOSIS — F10129 Alcohol abuse with intoxication, unspecified: Secondary | ICD-10-CM | POA: Insufficient documentation

## 2022-04-06 DIAGNOSIS — E119 Type 2 diabetes mellitus without complications: Secondary | ICD-10-CM | POA: Insufficient documentation

## 2022-04-06 NOTE — ED Triage Notes (Signed)
Pt brought in via ems.  Pt is homeless.  Pt requesting help with alcohol problem.  Ems [place].

## 2022-04-07 ENCOUNTER — Other Ambulatory Visit: Payer: Self-pay

## 2022-04-07 ENCOUNTER — Emergency Department
Admission: EM | Admit: 2022-04-07 | Discharge: 2022-04-08 | Disposition: A | Discharge status: Home / Self Care | Payer: BLUE CROSS/BLUE SHIELD | Source: Home / Self Care | Attending: Emergency Medicine | Admitting: Emergency Medicine

## 2022-04-07 DIAGNOSIS — F10120 Alcohol abuse with intoxication, uncomplicated: Secondary | ICD-10-CM | POA: Insufficient documentation

## 2022-04-07 DIAGNOSIS — E119 Type 2 diabetes mellitus without complications: Secondary | ICD-10-CM | POA: Insufficient documentation

## 2022-04-07 DIAGNOSIS — F10920 Alcohol use, unspecified with intoxication, uncomplicated: Secondary | ICD-10-CM

## 2022-04-07 DIAGNOSIS — R4182 Altered mental status, unspecified: Secondary | ICD-10-CM | POA: Insufficient documentation

## 2022-04-07 DIAGNOSIS — E876 Hypokalemia: Secondary | ICD-10-CM | POA: Insufficient documentation

## 2022-04-07 DIAGNOSIS — Y908 Blood alcohol level of 240 mg/100 ml or more: Secondary | ICD-10-CM | POA: Insufficient documentation

## 2022-04-07 LAB — COMPREHENSIVE METABOLIC PANEL
ALT: 41 U/L (ref 0–44)
AST: 82 U/L — ABNORMAL HIGH (ref 15–41)
Albumin: 4.9 g/dL (ref 3.5–5.0)
Alkaline Phosphatase: 90 U/L (ref 38–126)
Anion gap: 16 — ABNORMAL HIGH (ref 5–15)
BUN: 16 mg/dL (ref 6–20)
CO2: 24 mmol/L (ref 22–32)
Calcium: 9 mg/dL (ref 8.9–10.3)
Chloride: 101 mmol/L (ref 98–111)
Creatinine, Ser: 1.39 mg/dL — ABNORMAL HIGH (ref 0.61–1.24)
GFR, Estimated: >60 mL/min (ref >60)
Glucose, Bld: 75 mg/dL (ref 70–99)
Potassium: 3.7 mmol/L (ref 3.5–5.1)
Sodium: 141 mmol/L (ref 135–145)
Total Bilirubin: 0.7 mg/dL (ref 0.3–1.2)
Total Protein: 7.9 g/dL (ref 6.5–8.1)

## 2022-04-07 LAB — CBC WITH DIFFERENTIAL/PLATELET
Abs Immature Granulocytes: 0.01 10*3/uL (ref 0.00–0.07)
Basophils Absolute: 0.1 10*3/uL (ref 0.0–0.1)
Basophils Relative: 2 %
Eosinophils Absolute: 0.1 10*3/uL (ref 0.0–0.5)
Eosinophils Relative: 1 %
HCT: 50.8 % (ref 39.0–52.0)
Hemoglobin: 16.9 g/dL (ref 13.0–17.0)
Immature Granulocytes: 0 %
Lymphocytes Relative: 40 %
Lymphs Abs: 3 10*3/uL (ref 0.7–4.0)
MCH: 32.6 pg (ref 26.0–34.0)
MCHC: 33.3 g/dL (ref 30.0–36.0)
MCV: 97.9 fL (ref 80.0–100.0)
Monocytes Absolute: 0.3 10*3/uL (ref 0.1–1.0)
Monocytes Relative: 4 %
Neutro Abs: 4 10*3/uL (ref 1.7–7.7)
Neutrophils Relative %: 53 %
Platelets: 131 10*3/uL — ABNORMAL LOW (ref 150–400)
RBC: 5.19 MIL/uL (ref 4.22–5.81)
RDW: 14.6 % (ref 11.5–15.5)
WBC: 7.6 10*3/uL (ref 4.0–10.5)
nRBC: 0 % (ref 0.0–0.2)

## 2022-04-07 LAB — CBC
HCT: 48.3 % (ref 39.0–52.0)
Hemoglobin: 16.1 g/dL (ref 13.0–17.0)
MCH: 32.7 pg (ref 26.0–34.0)
MCHC: 33.3 g/dL (ref 30.0–36.0)
MCV: 98.2 fL (ref 80.0–100.0)
Platelets: 120 10*3/uL — ABNORMAL LOW (ref 150–400)
RBC: 4.92 MIL/uL (ref 4.22–5.81)
RDW: 14.3 % (ref 11.5–15.5)
WBC: 8.9 10*3/uL (ref 4.0–10.5)
nRBC: 0 % (ref 0.0–0.2)

## 2022-04-07 LAB — ETHANOL: Alcohol, Ethyl (B): 373 mg/dL (ref <10)

## 2022-04-07 MED ORDER — SODIUM CHLORIDE 0.9 % IV BOLUS
1000.0000 mL | Freq: Once | INTRAVENOUS | Status: AC
  Administered 2022-04-07: 1000 mL via INTRAVENOUS

## 2022-04-07 MED ORDER — LORAZEPAM 2 MG/ML IJ SOLN: 0.0000 mg | Freq: Four times a day (QID) | INTRAMUSCULAR | Status: DC

## 2022-04-07 MED ORDER — THIAMINE HCL 100 MG/ML IJ SOLN: 100.0000 mg | Freq: Every day | INTRAMUSCULAR | Status: DC

## 2022-04-07 MED ORDER — LORAZEPAM 2 MG/ML IJ SOLN: 0.0000 mg | Freq: Two times a day (BID) | INTRAMUSCULAR | Status: DC

## 2022-04-07 MED ORDER — CHLORDIAZEPOXIDE HCL 25 MG PO CAPS: ORAL_CAPSULE | ORAL | 0 refills | Status: AC

## 2022-04-07 MED ORDER — LEVOTHYROXINE SODIUM 50 MCG PO TABS: 100.0000 ug | ORAL_TABLET | Freq: Every day | ORAL | Status: DC

## 2022-04-07 MED ORDER — LORAZEPAM 2 MG PO TABS: 0.0000 mg | ORAL_TABLET | Freq: Four times a day (QID) | ORAL | Status: DC

## 2022-04-07 MED ORDER — THIAMINE HCL 100 MG/ML IJ SOLN
Freq: Once | INTRAVENOUS | Status: AC
  Filled 2022-04-07: qty 1000

## 2022-04-07 MED ORDER — THIAMINE HCL 100 MG PO TABS: 100.0000 mg | ORAL_TABLET | Freq: Every day | ORAL | Status: DC

## 2022-04-07 MED ORDER — LORAZEPAM 2 MG PO TABS: 0.0000 mg | ORAL_TABLET | Freq: Two times a day (BID) | ORAL | Status: DC

## 2022-04-07 MED ORDER — LEVOTHYROXINE SODIUM 50 MCG PO TABS
100.0000 ug | ORAL_TABLET | Freq: Every day | ORAL | Status: DC
  Administered 2022-04-07: 100 ug via ORAL
  Filled 2022-04-07: qty 2

## 2022-04-07 MED ORDER — THIAMINE HCL 100 MG PO TABS
100.0000 mg | ORAL_TABLET | Freq: Every day | ORAL | Status: DC
  Administered 2022-04-07: 100 mg via ORAL
  Filled 2022-04-07: qty 1

## 2022-04-07 NOTE — ED Notes (Signed)
Pt requesting to leave via bus. States he needs to get to [place].  Called ride again, who has not show up for patient. Per EDP, [place] pt appropriate to ride city bus to [place]. Pt able to ambulate safely, and perform all ADL's independently.  Verified correct patient and correct discharge papers given. Pt alert and oriented X 4, stable for discharge. RR even and unlabored, color WNL. Discussed discharge instructions and follow-up as directed. Discharge medications discussed, when prescribed. Pt had opportunity to ask questions, and RN available to provide patient and/or family education. Given all of belongings on discharge.

## 2022-04-07 NOTE — ED Notes (Addendum)
Pt calling a sober adult for ride home. Instructed to await in recliner until they arrive.

## 2022-04-07 NOTE — ED Provider Notes (Signed)
-----------------------------------------   11:05 AM on 04/07/2022 ----------------------------------------- Patient now appears clinically sober and is appropriate for discharge home with plan for follow-up at [facility] for detox.  He is requesting Librium taper and was provided with a prescription, counseled to avoid alcohol while taking this medication.  He was provided with outpatient detox resources and counseled to return to the ED for new or worsening symptoms.  Patient agrees with plan.   Chesley Noon, MD 04/07/22 1105
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
## 2022-04-07 NOTE — ED Notes (Signed)
Able to ambulate to and from bathroom safely. Alert and oriented X 4. Actions appropriate for age.

## 2022-04-07 NOTE — ED Notes (Signed)
Ride here in approx 1 hour.

## 2022-04-07 NOTE — BH Assessment (Signed)
Patient referral information faxed to the facility:   [facility] [phone])

## 2022-04-07 NOTE — ED Triage Notes (Signed)
Pt to ED via [facility].  Pt was found unresponsive in some bushes by [facility] [employer].  Pt awake and able to answer questions on arrival.  Pt states he had quite a bit to drink tonight.  Pt denies pain, GCS=14, pt confused.

## 2022-04-07 NOTE — ED Notes (Signed)
Declined breakfast
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
## 2022-04-07 NOTE — ED Notes (Signed)
Provided lunch, declines.

## 2022-04-07 NOTE — ED Notes (Signed)
Etoh 373 acknowledged by Dr. Larinda Buttery.

## 2022-04-07 NOTE — ED Provider Notes (Signed)
[facility] Provider Note    Event Date/Time   First MD Initiated Contact with Patient 04/07/22 0018     (approximate)   History   Alcohol Problem   HPI  Joshua Medina is a 36 y.o. male with history of hypothyroidism, diabetes, alcohol abuse who presents to the emergency department requesting alcohol detox.  States he drinks "a lot" of alcohol every day.  Reports history of withdrawal seizures.  Denies SI, HI.  States he has been seeing "my dead wife".  No drug use.  No medical complaints.   History provided by patient.    Past Medical History:  Diagnosis Date   Diabetes mellitus without complication (HCC)    Graves disease    Thyroid disease     Past Surgical History:  Procedure Laterality Date   thyroid ablation with radioactive I-131      MEDICATIONS:  Prior to Admission medications   Medication Sig Start Date End Date Taking? Authorizing Provider  levothyroxine (SYNTHROID) 100 MCG tablet Take 1 tablet (100 mcg total) by mouth daily. 01/17/21 02/16/21  Kathrynn Running, MD    Physical Exam   Triage Vital Signs: ED Triage Vitals  Enc Vitals Group     BP 04/06/22 2105 111/86     Pulse Rate 04/06/22 2105 82     Resp 04/06/22 2105 18     Temp 04/06/22 2105 97.9 F (36.6 C)     Temp Source 04/06/22 2105 Oral     SpO2 04/06/22 2105 95 %     Weight 04/06/22 2102 165 lb (74.8 kg)     Height 04/06/22 2102 5\' 10"  (1.778 m)     Head Circumference --      Peak Flow --      Pain Score --      Pain Loc --      Pain Edu? --      Excl. in GC? --     Most recent vital signs: Vitals:   04/06/22 2105 04/07/22 0529  BP: 111/86 111/86  Pulse: 82 82  Resp: 18   Temp: 97.9 F (36.6 C)   SpO2: 95%     CONSTITUTIONAL: Alert and oriented and responds appropriately to questions.  Chronically ill-appearing, disheveled, intoxicated HEAD: Normocephalic, atraumatic EYES: Conjunctivae clear, pupils appear equal, sclera nonicteric ENT: normal  nose; moist mucous membranes NECK: Supple, normal ROM CARD: RRR; S1 and S2 appreciated; no murmurs, no clicks, no rubs, no gallops RESP: Normal chest excursion without splinting or tachypnea; breath sounds clear and equal bilaterally; no wheezes, no rhonchi, no rales, no hypoxia or respiratory distress, speaking full sentences ABD/GI: Normal bowel sounds; non-distended; soft, non-tender, no rebound, no guarding, no peritoneal signs BACK: The back appears normal EXT: Normal ROM in all joints; no deformity noted, no edema; no cyanosis SKIN: Normal color for age and race; warm; no rash on exposed skin NEURO: Moves all extremities equally, normal speech PSYCH: Flat affect.  Denies SI or HI.  Is not responding to internal stimuli.   ED Results / Procedures / Treatments   LABS: (all labs ordered are listed, but only abnormal results are displayed) Labs Reviewed  ETHANOL  URINE DRUG SCREEN, QUALITATIVE ([facility] ONLY)  CBC WITH DIFFERENTIAL/PLATELET  COMPREHENSIVE METABOLIC PANEL     EKG:   RADIOLOGY: My personal review and interpretation of imaging:    I have personally reviewed all radiology reports.   No results found.   PROCEDURES:  Critical Care performed: No  Procedures    IMPRESSION / MDM / ASSESSMENT AND PLAN / ED COURSE  I reviewed the triage vital signs and the nursing notes.    Patient here requesting alcohol detox.    DIFFERENTIAL DIAGNOSIS (includes but not limited to):   Alcohol abuse, homelessness, depression, anxiety   Patient's presentation is most consistent with acute presentation with potential threat to life or bodily function.   PLAN: We will obtain screening labs with CBC, CMP, ethanol level, urine drug screen.  We will consult TTS.  Will place on CIWA protocol.  He will need to be at least monitored until clinically sober as he is quite intoxicated at this time.  No current psychiatric safety concerns.   MEDICATIONS GIVEN IN  ED: Medications  LORazepam (ATIVAN) injection 0-4 mg ( Intravenous Not Given 04/07/22 0624)    Or  LORazepam (ATIVAN) tablet 0-4 mg ( Oral See Alternative 04/07/22 0624)  LORazepam (ATIVAN) injection 0-4 mg (has no administration in time range)    Or  LORazepam (ATIVAN) tablet 0-4 mg (has no administration in time range)  thiamine (VITAMIN B1) tablet 100 mg (has no administration in time range)    Or  thiamine (VITAMIN B1) injection 100 mg (has no administration in time range)  levothyroxine (SYNTHROID) tablet 100 mcg (has no administration in time range)     ED COURSE: Labs still pending.  Patient is sleeping without signs of withdrawal.  Still clinically intoxicated.  Signed out the oncoming ED physician.  TTS is referring to RTS.   CONSULTS:  TTS consulted and will refer to RTS.   OUTSIDE RECORDS REVIEWED: Reviewed patient's previous endocrinology note in July 2019 with Carlena Sax.       FINAL CLINICAL IMPRESSION(S) / ED DIAGNOSES   Final diagnoses:  Alcohol abuse     Rx / DC Orders   ED Discharge Orders     None        Note:  This document was prepared using Dragon voice recognition software and may include unintentional dictation errors.   Berneda Piccininni, Layla Maw, DO 04/07/22 [phone]

## 2022-04-08 ENCOUNTER — Emergency Department: Payer: BLUE CROSS/BLUE SHIELD

## 2022-04-08 LAB — COMPREHENSIVE METABOLIC PANEL
ALT: 59 U/L — ABNORMAL HIGH (ref 0–44)
AST: 132 U/L — ABNORMAL HIGH (ref 15–41)
Albumin: 5.1 g/dL — ABNORMAL HIGH (ref 3.5–5.0)
Alkaline Phosphatase: 100 U/L (ref 38–126)
Anion gap: 17 — ABNORMAL HIGH (ref 5–15)
BUN: 16 mg/dL (ref 6–20)
CO2: 26 mmol/L (ref 22–32)
Calcium: 9.2 mg/dL (ref 8.9–10.3)
Chloride: 97 mmol/L — ABNORMAL LOW (ref 98–111)
Creatinine, Ser: 1.35 mg/dL — ABNORMAL HIGH (ref 0.61–1.24)
GFR, Estimated: >60 mL/min (ref >60)
Glucose, Bld: 101 mg/dL — ABNORMAL HIGH (ref 70–99)
Potassium: 3.1 mmol/L — ABNORMAL LOW (ref 3.5–5.1)
Sodium: 140 mmol/L (ref 135–145)
Total Bilirubin: 1.2 mg/dL (ref 0.3–1.2)
Total Protein: 8.4 g/dL — ABNORMAL HIGH (ref 6.5–8.1)

## 2022-04-08 LAB — ETHANOL: Alcohol, Ethyl (B): 448 mg/dL (ref <10)

## 2022-04-08 MED ORDER — POTASSIUM CHLORIDE CRYS ER 20 MEQ PO TBCR
40.0000 meq | EXTENDED_RELEASE_TABLET | Freq: Once | ORAL | Status: AC
  Administered 2022-04-08: 40 meq via ORAL
  Filled 2022-04-08: qty 2

## 2022-04-08 NOTE — ED Notes (Signed)
Pt presents to nurses desk with semi steady gait stating he needs new blankets due to pt urinating in the bed. Pt offered food and drink and pt declined.   Pt assisted with making new bed and getting cleaned up. Pt does endorse he does still feel intoxicated at this time, MD aware.

## 2022-04-08 NOTE — ED Notes (Signed)
Pt resting comfortably at this time. Normal rise and fall of chest. Call bell in reach. VSS.

## 2022-04-08 NOTE — Discharge Instructions (Addendum)
Drink alcohol only in moderation.  Return to the ER for worsening symptoms or other concerns. 

## 2022-04-08 NOTE — ED Notes (Signed)
Pt incontinent of urine, changed into a gown and linen changed.  Pt ambulatory to toilet with staff assistance. Pt very weak and unsteady on feet.

## 2022-04-08 NOTE — ED Notes (Signed)
Spoke w/[place], Barista at [phone].  Pt is a hold for potential tissue and eye donation. Referral number [id number]

## 2022-04-08 NOTE — ED Notes (Signed)
Pt returned from CT scan.

## 2022-04-08 NOTE — ED Notes (Signed)
Pt up and awake and states he is feeling better and ready to go. Pt ambulatory with steady gait on D/C. Pt left before discharge papers were printed.

## 2022-04-08 NOTE — ED Provider Notes (Signed)
Patient seen overnight while intoxicated. Denies any SI, HI, AVH. No trauma. Pt now back to baseline, is ambulating and tolerating PO. D/c home.   Shaune Pollack, MD 04/08/22 1949

## 2022-04-09 ENCOUNTER — Emergency Department
Admission: EM | Admit: 2022-04-09 | Discharge: 2022-04-09 | Disposition: A | Discharge status: Home / Self Care | Payer: BLUE CROSS/BLUE SHIELD | Attending: Emergency Medicine | Admitting: Emergency Medicine

## 2022-04-09 ENCOUNTER — Other Ambulatory Visit: Payer: Self-pay

## 2022-04-09 DIAGNOSIS — F10920 Alcohol use, unspecified with intoxication, uncomplicated: Secondary | ICD-10-CM

## 2022-04-09 DIAGNOSIS — F10120 Alcohol abuse with intoxication, uncomplicated: Secondary | ICD-10-CM | POA: Insufficient documentation

## 2022-04-09 DIAGNOSIS — E119 Type 2 diabetes mellitus without complications: Secondary | ICD-10-CM | POA: Insufficient documentation

## 2022-04-09 DIAGNOSIS — E039 Hypothyroidism, unspecified: Secondary | ICD-10-CM | POA: Diagnosis not present

## 2022-04-09 DIAGNOSIS — Y908 Blood alcohol level of 240 mg/100 ml or more: Secondary | ICD-10-CM | POA: Diagnosis not present

## 2022-04-09 DIAGNOSIS — Z79899 Other long term (current) drug therapy: Secondary | ICD-10-CM | POA: Insufficient documentation

## 2022-04-09 LAB — COMPREHENSIVE METABOLIC PANEL
ALT: 57 U/L — ABNORMAL HIGH (ref 0–44)
AST: 141 U/L — ABNORMAL HIGH (ref 15–41)
Albumin: 4.4 g/dL (ref 3.5–5.0)
Alkaline Phosphatase: 95 U/L (ref 38–126)
Anion gap: 11 (ref 5–15)
BUN: 11 mg/dL (ref 6–20)
CO2: 27 mmol/L (ref 22–32)
Calcium: 8.6 mg/dL — ABNORMAL LOW (ref 8.9–10.3)
Chloride: 106 mmol/L (ref 98–111)
Creatinine, Ser: 1.23 mg/dL (ref 0.61–1.24)
GFR, Estimated: >60 mL/min (ref >60)
Glucose, Bld: 81 mg/dL (ref 70–99)
Potassium: 4 mmol/L (ref 3.5–5.1)
Sodium: 144 mmol/L (ref 135–145)
Total Bilirubin: 0.9 mg/dL (ref 0.3–1.2)
Total Protein: 7.2 g/dL (ref 6.5–8.1)

## 2022-04-09 LAB — CBC WITH DIFFERENTIAL/PLATELET
Abs Immature Granulocytes: 0.02 10*3/uL (ref 0.00–0.07)
Basophils Absolute: 0.1 10*3/uL (ref 0.0–0.1)
Basophils Relative: 1 %
Eosinophils Absolute: 0.1 10*3/uL (ref 0.0–0.5)
Eosinophils Relative: 2 %
HCT: 46.4 % (ref 39.0–52.0)
Hemoglobin: 15.4 g/dL (ref 13.0–17.0)
Immature Granulocytes: 0 %
Lymphocytes Relative: 52 %
Lymphs Abs: 3.4 10*3/uL (ref 0.7–4.0)
MCH: 32.7 pg (ref 26.0–34.0)
MCHC: 33.2 g/dL (ref 30.0–36.0)
MCV: 98.5 fL (ref 80.0–100.0)
Monocytes Absolute: 0.3 10*3/uL (ref 0.1–1.0)
Monocytes Relative: 5 %
Neutro Abs: 2.6 10*3/uL (ref 1.7–7.7)
Neutrophils Relative %: 40 %
Platelets: 101 10*3/uL — ABNORMAL LOW (ref 150–400)
RBC: 4.71 MIL/uL (ref 4.22–5.81)
RDW: 14.5 % (ref 11.5–15.5)
WBC: 6.5 10*3/uL (ref 4.0–10.5)
nRBC: 0 % (ref 0.0–0.2)

## 2022-04-09 LAB — MAGNESIUM: Magnesium: 1.8 mg/dL (ref 1.7–2.4)

## 2022-04-09 LAB — ETHANOL: Alcohol, Ethyl (B): 377 mg/dL (ref <10)

## 2022-04-09 MED ORDER — SODIUM CHLORIDE 0.9 % IV BOLUS
1000.0000 mL | Freq: Once | INTRAVENOUS | Status: AC
  Administered 2022-04-09: 1000 mL via INTRAVENOUS

## 2022-04-09 NOTE — ED Notes (Signed)
Pt given sandwich tray and ginger ale. 

## 2022-04-09 NOTE — ED Provider Notes (Signed)
Patient seen overnight. He is a 36 yo M with h/o severe EtOH abuse and dependence seen daily for last several days, here with intoxication.  While reviewing patient's records, noted that he was significantly hypotensive.  I suspect this is related to his poor p.o. intake and recurrent alcohol abuse, but broad labs obtained for further review and fortunately showed no evidence of significant abnormality.  His mild AKI from previous has actually improved.  Alcohol is markedly elevated as usual.  He has no other evidence of acute emergent medical pathology.  We will continue to monitor and plan to discharge once clinically sober.  Patient updated and is in agreement with this plan.   Shaune Pollack, MD 04/09/22 1537

## 2022-04-09 NOTE — ED Provider Notes (Signed)
[facility] Provider Note    Event Date/Time   First MD Initiated Contact with Patient 04/09/22 0101     (approximate)   History   Alcohol Intoxication   HPI  Ferdinando Lodge is a 36 y.o. male with history of hypothyroidism, diabetes, alcohol abuse who presents to the emergency department intoxicated.  Patient has no complaints.  Denies any pain.  No SI or HI.  Admits to drinking alcohol today.  This is his third emergency department visit in the past several days for the same.   History provided by patient and EMS.    Past Medical History:  Diagnosis Date   Diabetes mellitus without complication (HCC)    Graves disease    Thyroid disease     Past Surgical History:  Procedure Laterality Date   thyroid ablation with radioactive I-131      MEDICATIONS:  Prior to Admission medications   Medication Sig Start Date End Date Taking? Authorizing Provider  chlordiazePOXIDE (LIBRIUM) 25 MG capsule Take 1 capsule (25 mg total) by mouth 5 (five) times daily for 1 day, THEN 1 capsule (25 mg total) 4 (four) times daily for 1 day, THEN 1 capsule (25 mg total) 3 (three) times daily for 1 day, THEN 1 capsule (25 mg total) 2 (two) times daily for 1 day, THEN 1 capsule (25 mg total) daily for 1 day. 04/07/22 04/12/22  Chesley Noon, MD  levothyroxine (SYNTHROID) 100 MCG tablet Take 1 tablet (100 mcg total) by mouth daily. 01/17/21 02/16/21  Kathrynn Running, MD    Physical Exam   Triage Vital Signs: ED Triage Vitals  Enc Vitals Group     BP 04/09/22 0108 (!) 164/105     Pulse Rate 04/09/22 0108 72     Resp 04/09/22 0106 14     Temp --      Temp src --      SpO2 04/09/22 0108 98 %     Weight 04/09/22 0106 160 lb (72.6 kg)     Height 04/09/22 0106 5\' 10"  (1.778 m)     Head Circumference --      Peak Flow --      Pain Score 04/09/22 0106 0     Pain Loc --      Pain Edu? --      Excl. in GC? --     Most recent vital signs: Vitals:   04/09/22 0112  04/09/22 0121  BP:  (!) 164/105  Pulse:  72  Resp:    Temp: 97.6 F (36.4 C)   SpO2:      CONSTITUTIONAL: Alert and oriented and responds appropriately to questions.  Toxic aided.  Chronically ill-appearing.  Disheveled.  In no distress. HEAD: Normocephalic, atraumatic EYES: Conjunctivae clear, pupils appear equal, sclera nonicteric ENT: normal nose; moist mucous membranes NECK: Supple, normal ROM CARD: RRR; S1 and S2 appreciated; no murmurs, no clicks, no rubs, no gallops RESP: Normal chest excursion without splinting or tachypnea; breath sounds clear and equal bilaterally; no wheezes, no rhonchi, no rales, no hypoxia or respiratory distress, speaking full sentences ABD/GI: Normal bowel sounds; non-distended; soft, non-tender, no rebound, no guarding, no peritoneal signs BACK: The back appears normal EXT: Normal ROM in all joints; no deformity noted, no edema; no cyanosis SKIN: Normal color for age and race; warm; no rash on exposed skin NEURO: Moves all extremities equally, normal speech PSYCH: The patient's mood and manner are appropriate.  No SI or HI.   ED  Results / Procedures / Treatments   LABS: (all labs ordered are listed, but only abnormal results are displayed) Labs Reviewed - No data to display   EKG:   RADIOLOGY: My personal review and interpretation of imaging:    I have personally reviewed all radiology reports.   No results found.   PROCEDURES:  Critical Care performed: No     Procedures    IMPRESSION / MDM / ASSESSMENT AND PLAN / ED COURSE  I reviewed the triage vital signs and the nursing notes.    Patient here with EMS for alcohol intoxication.  No acute complaints.    DIFFERENTIAL DIAGNOSIS (includes but not limited to):   Alcohol intoxication, substance abuse, depression, anxiety   Patient's presentation is most consistent with severe exacerbation of chronic illness.   PLAN: We will continue to monitor patient closely in the  emergency department.  History of alcohol abuse and alcohol levels often in the 300s to 400s.  No sign of withdrawal here.  He has no acute complaints.  No sign of traumatic injury.  No psychiatric concerns.  Will monitor until clinically sober.   MEDICATIONS GIVEN IN ED: Medications - No data to display   ED COURSE: Patient continues to be resting comfortably without complaints.  He will be signed out to the oncoming ED physicians in the morning to reassess when clinically sober.   CONSULTS: Dispo pending further evaluation when clinically sober.   OUTSIDE RECORDS REVIEWED: Reviewed patient's last endocrinology note in July 2019.       FINAL CLINICAL IMPRESSION(S) / ED DIAGNOSES   Final diagnoses:  Acute alcoholic intoxication without complication (HCC)     Rx / DC Orders   ED Discharge Orders     None        Note:  This document was prepared using Dragon voice recognition software and may include unintentional dictation errors.   Ronan Duecker, Layla Maw, DO 04/09/22 [phone]

## 2022-04-09 NOTE — ED Triage Notes (Signed)
Pt arrives ems with etoh intoxication. Pt alert, resps unlabored.

## 2022-04-09 NOTE — ED Notes (Signed)
Pt arouses to verbal stimuli. Toileting and PO hydration offered, pt denies at this time. Bed in lowest position. Pt next to nurses desk. Will continue to monitor.

## 2022-04-09 NOTE — ED Notes (Signed)
Pt A&O, IV removed, pt given discharge instructions, pt ambulating with steady gait. 

## 2022-04-15 ENCOUNTER — Other Ambulatory Visit: Payer: Self-pay

## 2022-04-15 ENCOUNTER — Encounter: Payer: Self-pay | Admitting: Intensive Care

## 2022-04-15 ENCOUNTER — Emergency Department
Admission: EM | Admit: 2022-04-15 | Discharge: 2022-04-15 | Discharge status: Against Medical Advice | Payer: BLUE CROSS/BLUE SHIELD | Source: Home / Self Care

## 2022-04-15 DIAGNOSIS — K922 Gastrointestinal hemorrhage, unspecified: Secondary | ICD-10-CM | POA: Diagnosis not present

## 2022-04-15 DIAGNOSIS — R112 Nausea with vomiting, unspecified: Secondary | ICD-10-CM | POA: Insufficient documentation

## 2022-04-15 DIAGNOSIS — Z5321 Procedure and treatment not carried out due to patient leaving prior to being seen by health care provider: Secondary | ICD-10-CM | POA: Insufficient documentation

## 2022-04-15 DIAGNOSIS — F10120 Alcohol abuse with intoxication, uncomplicated: Secondary | ICD-10-CM | POA: Insufficient documentation

## 2022-04-15 DIAGNOSIS — K226 Gastro-esophageal laceration-hemorrhage syndrome: Secondary | ICD-10-CM | POA: Diagnosis not present

## 2022-04-15 DIAGNOSIS — Y907 Blood alcohol level of 200-239 mg/100 ml: Secondary | ICD-10-CM | POA: Insufficient documentation

## 2022-04-15 LAB — COMPREHENSIVE METABOLIC PANEL
ALT: 78 U/L — ABNORMAL HIGH (ref 0–44)
AST: 184 U/L — ABNORMAL HIGH (ref 15–41)
Albumin: 5 g/dL (ref 3.5–5.0)
Alkaline Phosphatase: 112 U/L (ref 38–126)
Anion gap: 16 — ABNORMAL HIGH (ref 5–15)
BUN: 17 mg/dL (ref 6–20)
CO2: 27 mmol/L (ref 22–32)
Calcium: 8.9 mg/dL (ref 8.9–10.3)
Chloride: 98 mmol/L (ref 98–111)
Creatinine, Ser: 1.03 mg/dL (ref 0.61–1.24)
GFR, Estimated: >60 mL/min (ref >60)
Glucose, Bld: 116 mg/dL — ABNORMAL HIGH (ref 70–99)
Potassium: 3.4 mmol/L — ABNORMAL LOW (ref 3.5–5.1)
Sodium: 141 mmol/L (ref 135–145)
Total Bilirubin: 1.5 mg/dL — ABNORMAL HIGH (ref 0.3–1.2)
Total Protein: 8.4 g/dL — ABNORMAL HIGH (ref 6.5–8.1)

## 2022-04-15 LAB — CBC
HCT: 47 % (ref 39.0–52.0)
Hemoglobin: 15.9 g/dL (ref 13.0–17.0)
MCH: 32.2 pg (ref 26.0–34.0)
MCHC: 33.8 g/dL (ref 30.0–36.0)
MCV: 95.1 fL (ref 80.0–100.0)
Platelets: 61 10*3/uL — ABNORMAL LOW (ref 150–400)
RBC: 4.94 MIL/uL (ref 4.22–5.81)
RDW: 14.2 % (ref 11.5–15.5)
WBC: 6.2 10*3/uL (ref 4.0–10.5)
nRBC: 0 % (ref 0.0–0.2)

## 2022-04-15 LAB — ETHANOL: Alcohol, Ethyl (B): 229 mg/dL — ABNORMAL HIGH (ref <10)

## 2022-04-15 MED ORDER — ONDANSETRON 4 MG PO TBDP
4.0000 mg | ORAL_TABLET | Freq: Once | ORAL | Status: AC
  Administered 2022-04-15: 4 mg via ORAL
  Filled 2022-04-15: qty 1

## 2022-04-15 NOTE — ED Triage Notes (Signed)
Pt ca;;ed x's 3, no response

## 2022-04-15 NOTE — ED Triage Notes (Signed)
Patient reports arriving by EMS with c/o "I am going through detox." Reports last alcohol drink was two days ago. Patient nauseas and gagging during triage.   Denies drug use

## 2022-04-15 NOTE — ED Triage Notes (Signed)
Pt called, no response

## 2022-04-15 NOTE — ED Triage Notes (Signed)
Pt in via EMS from [facility] lodge intoxicated. Pt has been drinking for 3 days and is now vomiting.

## 2022-04-15 NOTE — ED Provider Triage Note (Signed)
Emergency Medicine Provider Triage Evaluation Note  Joshua Medina, a 36 y.o. male  was evaluated in triage.  Pt complains of alcohol withdrawal.  She presents to the ED via EMS from home with complaints of "going through detox."  Patient reports his last drink was 2 days ago.  He reports nausea and vomiting.  MS reports they picked the patient from local motel, where he reported drinking for the last 3 days.  Review of Systems  Positive: N/V, Etoh withdrawal Negative: FCS  Physical Exam  Pulse (!) 103   Temp 97.9 F (36.6 C) (Oral)   Resp 16   Ht 5\' 10"  (1.778 m)   Wt 65.8 kg   SpO2 96%   BMI 20.81 kg/m  Gen:   Awake, no distress  NAD Resp:  Normal effort CTA MSK:   Moves extremities without difficulty  CVS:  RRR  Medical Decision Making  Medically screening exam initiated at 12:15 PM.  Appropriate orders placed.  Joshua Medina was informed that the remainder of the evaluation will be completed by another provider, this initial triage assessment does not replace that evaluation, and the importance of remaining in the ED until their evaluation is complete.  Patient to the ED for alcohol withdrawal with symptoms including nausea and vomiting.   Townsend Roger, PA-C 04/15/22 1217

## 2022-04-15 NOTE — ED Triage Notes (Signed)
Pt called for VS/reassessment, no response.

## 2022-04-18 ENCOUNTER — Other Ambulatory Visit: Payer: Self-pay

## 2022-04-18 ENCOUNTER — Inpatient Hospital Stay: Payer: BLUE CROSS/BLUE SHIELD

## 2022-04-18 ENCOUNTER — Inpatient Hospital Stay
Admission: EM | Admit: 2022-04-18 | Discharge: 2022-04-21 | DRG: 368 | Disposition: A | Discharge status: Home / Self Care | Payer: BLUE CROSS/BLUE SHIELD | Attending: Hospitalist | Admitting: Hospitalist

## 2022-04-18 DIAGNOSIS — Z888 Allergy status to other drugs, medicaments and biological substances status: Secondary | ICD-10-CM | POA: Diagnosis not present

## 2022-04-18 DIAGNOSIS — F10129 Alcohol abuse with intoxication, unspecified: Secondary | ICD-10-CM | POA: Diagnosis present

## 2022-04-18 DIAGNOSIS — Z5900 Homelessness unspecified: Secondary | ICD-10-CM

## 2022-04-18 DIAGNOSIS — Z833 Family history of diabetes mellitus: Secondary | ICD-10-CM | POA: Diagnosis not present

## 2022-04-18 DIAGNOSIS — K3189 Other diseases of stomach and duodenum: Secondary | ICD-10-CM | POA: Diagnosis present

## 2022-04-18 DIAGNOSIS — E876 Hypokalemia: Secondary | ICD-10-CM | POA: Diagnosis not present

## 2022-04-18 DIAGNOSIS — K920 Hematemesis: Secondary | ICD-10-CM | POA: Insufficient documentation

## 2022-04-18 DIAGNOSIS — D62 Acute posthemorrhagic anemia: Secondary | ICD-10-CM | POA: Diagnosis present

## 2022-04-18 DIAGNOSIS — K449 Diaphragmatic hernia without obstruction or gangrene: Secondary | ICD-10-CM | POA: Diagnosis present

## 2022-04-18 DIAGNOSIS — K922 Gastrointestinal hemorrhage, unspecified: Secondary | ICD-10-CM | POA: Diagnosis present

## 2022-04-18 DIAGNOSIS — K2970 Gastritis, unspecified, without bleeding: Secondary | ICD-10-CM

## 2022-04-18 DIAGNOSIS — D6959 Other secondary thrombocytopenia: Secondary | ICD-10-CM | POA: Diagnosis present

## 2022-04-18 DIAGNOSIS — R569 Unspecified convulsions: Secondary | ICD-10-CM | POA: Diagnosis present

## 2022-04-18 DIAGNOSIS — K2990 Gastroduodenitis, unspecified, without bleeding: Secondary | ICD-10-CM | POA: Diagnosis present

## 2022-04-18 DIAGNOSIS — Z79890 Hormone replacement therapy (postmenopausal): Secondary | ICD-10-CM

## 2022-04-18 DIAGNOSIS — K7010 Alcoholic hepatitis without ascites: Secondary | ICD-10-CM | POA: Diagnosis present

## 2022-04-18 DIAGNOSIS — K2920 Alcoholic gastritis without bleeding: Secondary | ICD-10-CM | POA: Diagnosis present

## 2022-04-18 DIAGNOSIS — K760 Fatty (change of) liver, not elsewhere classified: Secondary | ICD-10-CM | POA: Diagnosis present

## 2022-04-18 DIAGNOSIS — Y907 Blood alcohol level of 200-239 mg/100 ml: Secondary | ICD-10-CM | POA: Diagnosis present

## 2022-04-18 DIAGNOSIS — E119 Type 2 diabetes mellitus without complications: Secondary | ICD-10-CM | POA: Diagnosis present

## 2022-04-18 DIAGNOSIS — E039 Hypothyroidism, unspecified: Secondary | ICD-10-CM | POA: Diagnosis present

## 2022-04-18 DIAGNOSIS — K226 Gastro-esophageal laceration-hemorrhage syndrome: Secondary | ICD-10-CM | POA: Diagnosis present

## 2022-04-18 DIAGNOSIS — F10130 Alcohol abuse with withdrawal, uncomplicated: Secondary | ICD-10-CM | POA: Diagnosis present

## 2022-04-18 DIAGNOSIS — F17210 Nicotine dependence, cigarettes, uncomplicated: Secondary | ICD-10-CM | POA: Diagnosis present

## 2022-04-18 DIAGNOSIS — G928 Other toxic encephalopathy: Secondary | ICD-10-CM | POA: Diagnosis present

## 2022-04-18 DIAGNOSIS — Z20822 Contact with and (suspected) exposure to covid-19: Secondary | ICD-10-CM | POA: Diagnosis present

## 2022-04-18 DIAGNOSIS — Z91148 Patient's other noncompliance with medication regimen for other reason: Secondary | ICD-10-CM

## 2022-04-18 LAB — COMPREHENSIVE METABOLIC PANEL
ALT: 128 U/L — ABNORMAL HIGH (ref 0–44)
AST: 307 U/L — ABNORMAL HIGH (ref 15–41)
Albumin: 5 g/dL (ref 3.5–5.0)
Alkaline Phosphatase: 124 U/L (ref 38–126)
Anion gap: 18 — ABNORMAL HIGH (ref 5–15)
BUN: 26 mg/dL — ABNORMAL HIGH (ref 6–20)
CO2: 28 mmol/L (ref 22–32)
Calcium: 9.3 mg/dL (ref 8.9–10.3)
Chloride: 92 mmol/L — ABNORMAL LOW (ref 98–111)
Creatinine, Ser: 1.12 mg/dL (ref 0.61–1.24)
GFR, Estimated: >60 mL/min (ref >60)
Glucose, Bld: 117 mg/dL — ABNORMAL HIGH (ref 70–99)
Potassium: 3.9 mmol/L (ref 3.5–5.1)
Sodium: 138 mmol/L (ref 135–145)
Total Bilirubin: 1.9 mg/dL — ABNORMAL HIGH (ref 0.3–1.2)
Total Protein: 7.9 g/dL (ref 6.5–8.1)

## 2022-04-18 LAB — CBC WITH DIFFERENTIAL/PLATELET
Abs Immature Granulocytes: 0.08 10*3/uL — ABNORMAL HIGH (ref 0.00–0.07)
Basophils Absolute: 0.1 10*3/uL (ref 0.0–0.1)
Basophils Relative: 1 %
Eosinophils Absolute: 0 10*3/uL (ref 0.0–0.5)
Eosinophils Relative: 0 %
HCT: 36.1 % — ABNORMAL LOW (ref 39.0–52.0)
Hemoglobin: 12 g/dL — ABNORMAL LOW (ref 13.0–17.0)
Immature Granulocytes: 1 %
Lymphocytes Relative: 29 %
Lymphs Abs: 3 10*3/uL (ref 0.7–4.0)
MCH: 32.6 pg (ref 26.0–34.0)
MCHC: 33.2 g/dL (ref 30.0–36.0)
MCV: 98.1 fL (ref 80.0–100.0)
Monocytes Absolute: 0.7 10*3/uL (ref 0.1–1.0)
Monocytes Relative: 7 %
Neutro Abs: 6.7 10*3/uL (ref 1.7–7.7)
Neutrophils Relative %: 62 %
Platelets: 56 10*3/uL — ABNORMAL LOW (ref 150–400)
RBC: 3.68 MIL/uL — ABNORMAL LOW (ref 4.22–5.81)
RDW: 14 % (ref 11.5–15.5)
WBC: 10.6 10*3/uL — ABNORMAL HIGH (ref 4.0–10.5)
nRBC: 0 % (ref 0.0–0.2)

## 2022-04-18 LAB — IRON AND TIBC
Iron: 221 ug/dL — ABNORMAL HIGH (ref 45–182)
Saturation Ratios: 90 % — ABNORMAL HIGH (ref 17.9–39.5)
TIBC: 246 ug/dL — ABNORMAL LOW (ref 250–450)
UIBC: 25 ug/dL

## 2022-04-18 LAB — ABO/RH: ABO/RH(D): O POS

## 2022-04-18 LAB — SARS CORONAVIRUS 2 BY RT PCR: SARS Coronavirus 2 by RT PCR: NEGATIVE

## 2022-04-18 LAB — FERRITIN: Ferritin: 347 ng/mL — ABNORMAL HIGH (ref 24–336)

## 2022-04-18 LAB — TSH: TSH: 46.447 u[IU]/mL — ABNORMAL HIGH (ref 0.350–4.500)

## 2022-04-18 LAB — ETHANOL: Alcohol, Ethyl (B): 191 mg/dL — ABNORMAL HIGH (ref <10)

## 2022-04-18 LAB — LIPASE, BLOOD: Lipase: 24 U/L (ref 11–51)

## 2022-04-18 LAB — HEMOGLOBIN AND HEMATOCRIT, BLOOD
HCT: 25.9 % — ABNORMAL LOW (ref 39.0–52.0)
HCT: 25.8 % — ABNORMAL LOW (ref 39.0–52.0)
Hemoglobin: 8.5 g/dL — ABNORMAL LOW (ref 13.0–17.0)
Hemoglobin: 8.8 g/dL — ABNORMAL LOW (ref 13.0–17.0)

## 2022-04-18 LAB — APTT: aPTT: 30 seconds (ref 24–36)

## 2022-04-18 LAB — PROTIME-INR
INR: 1.1 (ref 0.8–1.2)
Prothrombin Time: 14.3 seconds (ref 11.4–15.2)

## 2022-04-18 LAB — PREPARE RBC (CROSSMATCH)

## 2022-04-18 MED ORDER — FOLIC ACID 1 MG PO TABS
1.0000 mg | ORAL_TABLET | Freq: Every day | ORAL | Status: DC
  Administered 2022-04-18 – 2022-04-21 (×4): 1 mg via ORAL
  Filled 2022-04-18 (×4): qty 1

## 2022-04-18 MED ORDER — ACETAMINOPHEN 650 MG RE SUPP: 650.0000 mg | Freq: Four times a day (QID) | RECTAL | Status: DC | PRN

## 2022-04-18 MED ORDER — SODIUM CHLORIDE 0.9 % IV SOLN
2.0000 g | Freq: Once | INTRAVENOUS | Status: AC
  Administered 2022-04-18: 2 g via INTRAVENOUS
  Filled 2022-04-18: qty 20

## 2022-04-18 MED ORDER — ONDANSETRON HCL 4 MG/2ML IJ SOLN
4.0000 mg | Freq: Four times a day (QID) | INTRAMUSCULAR | Status: DC | PRN
Start: 2022-04-18 — End: 2022-04-21

## 2022-04-18 MED ORDER — SODIUM CHLORIDE 0.9 % IV SOLN: INTRAVENOUS | Status: AC

## 2022-04-18 MED ORDER — SENNOSIDES-DOCUSATE SODIUM 8.6-50 MG PO TABS: 1.0000 | ORAL_TABLET | Freq: Every evening | ORAL | Status: DC | PRN

## 2022-04-18 MED ORDER — HYDROMORPHONE HCL 1 MG/ML IJ SOLN: 0.5000 mg | INTRAMUSCULAR | Status: DC | PRN

## 2022-04-18 MED ORDER — LORAZEPAM 1 MG PO TABS: 1.0000 mg | ORAL_TABLET | ORAL | Status: AC | PRN

## 2022-04-18 MED ORDER — THIAMINE HCL 100 MG/ML IJ SOLN: 100.0000 mg | Freq: Every day | INTRAMUSCULAR | Status: DC

## 2022-04-18 MED ORDER — THIAMINE HCL 100 MG PO TABS
100.0000 mg | ORAL_TABLET | Freq: Every day | ORAL | Status: DC
  Administered 2022-04-19 – 2022-04-21 (×3): 100 mg via ORAL
  Filled 2022-04-18 (×7): qty 1

## 2022-04-18 MED ORDER — BISACODYL 5 MG PO TBEC: 5.0000 mg | DELAYED_RELEASE_TABLET | Freq: Every day | ORAL | Status: DC | PRN

## 2022-04-18 MED ORDER — LEVOTHYROXINE SODIUM 50 MCG PO TABS: 100.0000 ug | ORAL_TABLET | Freq: Every day | ORAL | Status: DC

## 2022-04-18 MED ORDER — ACETAMINOPHEN 325 MG PO TABS: 650.0000 mg | ORAL_TABLET | Freq: Four times a day (QID) | ORAL | Status: DC | PRN

## 2022-04-18 MED ORDER — SODIUM CHLORIDE 0.9 % IV SOLN
2.0000 g | INTRAVENOUS | Status: DC
  Administered 2022-04-19: 2 g via INTRAVENOUS
  Filled 2022-04-18: qty 20

## 2022-04-18 MED ORDER — PANTOPRAZOLE SODIUM 40 MG IV SOLR
40.0000 mg | Freq: Two times a day (BID) | INTRAVENOUS | Status: DC
  Administered 2022-04-18 – 2022-04-19 (×4): 40 mg via INTRAVENOUS
  Filled 2022-04-18 (×4): qty 10

## 2022-04-18 MED ORDER — SODIUM CHLORIDE 0.9 % IV SOLN: 10.0000 mL/h | Freq: Once | INTRAVENOUS | Status: DC

## 2022-04-18 MED ORDER — ADULT MULTIVITAMIN W/MINERALS CH
1.0000 | ORAL_TABLET | Freq: Every day | ORAL | Status: DC
  Administered 2022-04-18 – 2022-04-21 (×4): 1 via ORAL
  Filled 2022-04-18 (×5): qty 1

## 2022-04-18 MED ORDER — THIAMINE HCL 100 MG/ML IJ SOLN
100.0000 mg | Freq: Once | INTRAMUSCULAR | Status: AC
  Administered 2022-04-18: 100 mg via INTRAVENOUS
  Filled 2022-04-18: qty 2

## 2022-04-18 MED ORDER — SODIUM CHLORIDE 0.9 % IV BOLUS
1000.0000 mL | Freq: Once | INTRAVENOUS | Status: AC
  Administered 2022-04-18: 1000 mL via INTRAVENOUS

## 2022-04-18 MED ORDER — SODIUM CHLORIDE 0.9% IV SOLUTION
Freq: Once | INTRAVENOUS | Status: AC
Start: 2022-04-18 — End: 2022-04-18

## 2022-04-18 MED ORDER — OCTREOTIDE LOAD VIA INFUSION
50.0000 ug | Freq: Once | INTRAVENOUS | Status: AC
  Administered 2022-04-18: 50 ug via INTRAVENOUS
  Filled 2022-04-18: qty 25

## 2022-04-18 MED ORDER — LORAZEPAM 2 MG/ML IJ SOLN: 1.0000 mg | INTRAMUSCULAR | Status: AC | PRN

## 2022-04-18 MED ORDER — SODIUM CHLORIDE 0.9 % IV SOLN
50.0000 ug/h | INTRAVENOUS | Status: DC
  Administered 2022-04-18 – 2022-04-19 (×3): 50 ug/h via INTRAVENOUS
  Filled 2022-04-18 (×5): qty 1

## 2022-04-18 MED ORDER — PANTOPRAZOLE 80MG IVPB - SIMPLE MED
80.0000 mg | Freq: Once | INTRAVENOUS | Status: AC
  Administered 2022-04-18: 80 mg via INTRAVENOUS
  Filled 2022-04-18: qty 100

## 2022-04-18 MED ORDER — DIAZEPAM 5 MG/ML IJ SOLN
5.0000 mg | Freq: Once | INTRAMUSCULAR | Status: AC
  Administered 2022-04-18: 5 mg via INTRAVENOUS
  Filled 2022-04-18: qty 2

## 2022-04-18 NOTE — ED Triage Notes (Signed)
Pt to ER via EMS, [place], Ems reports large amount of emisis with large clots seen.

## 2022-04-18 NOTE — Progress Notes (Signed)
Repeat H&H 8.5, patient remained tachycardia throughout the afternoon and blood pressure borderline low.  Ordered a NS bolus earlier 1 L.  Now ordered 1 unit PRBC, discussed with nurse.  Next H&H reading tonight at 10.

## 2022-04-18 NOTE — ED Notes (Signed)
Pt signed blood consent, placed in paper chart.

## 2022-04-18 NOTE — Consult Note (Signed)
Cephas Darby, MD [address]  [place]  [place], [place] [address]  Main: [phone]  Fax: [phone] Pager: [phone]   Consultation  Referring Provider:     No ref. provider found Primary Care Physician:  Pcp, No Primary Gastroenterologist: Althia Forts       Reason for Consultation: Hematemesis  Date of Admission:  04/18/2022 Date of Consultation:  04/18/2022         HPI:   Joshua Medina is a 36 y.o. male history of alcohol abuse, alcohol withdrawal seizures, diabetes, hypothyroidism presented with hematemesis.  [place] homeless [place] blood he had large amount of emesis with large clots.  Patient was under the influence of alcohol, in the ER.  Patient admits to drinking half a gallon to a gallon of liquor every day last drink was today morning.  Patient was tachycardic, normotensive, afebrile.  Labs were significant for hemoglobin 12, dropped from 15.93 days ago, platelets 56, elevated BUN/creatinine ratio 26/1.12, elevated LFTs AST 307, ALT 128, T. bili 1.9.  Normal serum lipase, PT/INR normal, serum alcohol levels 190.  Patient is started on pantoprazole drip, octreotide drip and ceftriaxone.  He had right upper quadrant ultrasound which revealed heterogeneous liver parenchyma, patent portal vein with normal directional blood flow to his liver, no focal abnormality.  NSAIDs: None  Antiplts/Anticoagulants/Anti thrombotics: None  GI Procedures: None  Past Medical History:  Diagnosis Date   Diabetes mellitus without complication ([place])    Graves disease    Thyroid disease     Past Surgical History:  Procedure Laterality Date   thyroid ablation with radioactive I-131       Current Facility-Administered Medications:    0.9 %  sodium chloride infusion, 10 mL/hr, Intravenous, Once, Starleen Blue, Jennette Dubin, MD   0.9 %  sodium chloride infusion, , Intravenous, Continuous, Wynetta Fines T, MD, Last Rate: 125 mL/hr at 04/18/22 1242, New Bag at  04/18/22 1242   acetaminophen (TYLENOL) tablet 650 mg, 650 mg, Oral, Q6H PRN **OR** acetaminophen (TYLENOL) suppository 650 mg, 650 mg, Rectal, Q6H PRN, Lequita Halt, MD   bisacodyl (DULCOLAX) EC tablet 5 mg, 5 mg, Oral, Daily PRN, Lequita Halt, MD   [START ON 04/19/2022] cefTRIAXone (ROCEPHIN) 2 g in sodium chloride 0.9 % 100 mL IVPB, 2 g, Intravenous, Q24H, Zhang, Ping T, MD   folic acid (FOLVITE) tablet 1 mg, 1 mg, Oral, Daily, [place], Ping T, MD, 1 mg at 04/18/22 1248   HYDROmorphone (DILAUDID) injection 0.5-1 mg, 0.5-1 mg, Intravenous, Q2H PRN, Wynetta Fines T, MD   LORazepam (ATIVAN) tablet 1-4 mg, 1-4 mg, Oral, Q1H PRN **OR** LORazepam (ATIVAN) injection 1-4 mg, 1-4 mg, Intravenous, Q1H PRN, Wynetta Fines T, MD   multivitamin with minerals tablet 1 tablet, 1 tablet, Oral, Daily, Wynetta Fines T, MD, 1 tablet at 04/18/22 1248   [COMPLETED] octreotide (SANDOSTATIN) 2 mcg/mL load via infusion 50 mcg, 50 mcg, Intravenous, Once, 50 mcg at 04/18/22 1008 **AND** octreotide (SANDOSTATIN) 500 mcg in sodium chloride 0.9 % 250 mL (2 mcg/mL) infusion, 50 mcg/hr, Intravenous, Continuous, Rada Hay, MD, Last Rate: 25 mL/hr at 04/18/22 1008, 50 mcg/hr at 04/18/22 1008   ondansetron (ZOFRAN) injection 4 mg, 4 mg, Intravenous, Q6H PRN, Wynetta Fines T, MD   pantoprazole (PROTONIX) injection 40 mg, 40 mg, Intravenous, Q12H, Wynetta Fines T, MD, 40 mg at 04/18/22 1250   senna-docusate (Senokot-S) tablet 1 tablet, 1 tablet, Oral, QHS PRN, Lequita Halt, MD  sodium chloride 0.9 % bolus 1,000 mL, 1,000 mL, Intravenous, Once, Wynetta Fines T, MD   thiamine (VITAMIN B1) tablet 100 mg, 100 mg, Oral, Daily **OR** thiamine (VITAMIN B1) injection 100 mg, 100 mg, Intravenous, Daily, Wynetta Fines T, MD  Current Outpatient Medications:    levothyroxine (SYNTHROID) 100 MCG tablet, Take 1 tablet (100 mcg total) by mouth daily., Disp: 30 tablet, Rfl: 0   Family History  Problem Relation Age of Onset   Diabetes Paternal  Grandmother      Social History   Tobacco Use   Smoking status: Every Day    Types: Cigarettes   Smokeless tobacco: Never  Vaping Use   Vaping Use: Some days  Substance Use Topics   Alcohol use: Yes    Comment: fifth of liquor daily   Drug use: Never    Allergies as of 04/18/2022 - Review Complete 04/18/2022  Allergen Reaction Noted   Precedex [dexmedetomidine hcl in nacl] Other (See Comments) 06/06/2020    Review of Systems:    All systems reviewed and negative except where noted in HPI.   Physical Exam:  Vital signs in last 24 hours: Temp:  [97.9 F (36.6 C)-98.8 F (37.1 C)] 98.8 F (37.1 C) (08/29 1405) Pulse Rate:  [98-137] 105 (08/29 1530) Resp:  [10-25] 12 (08/29 1530) BP: (87-107)/(65-86) 90/73 (08/29 1530) SpO2:  [99 %-100 %] 99 % (08/29 1530) Weight:  [70.3 kg] 70.3 kg (08/29 0913)   General:   Pleasant, cooperative in NAD, jittery Head:  Normocephalic and atraumatic. Eyes:   No icterus.   Conjunctiva pink. PERRLA. Ears:  Normal auditory acuity. Neck:  Supple; no masses or thyroidomegaly Lungs: Respirations even and unlabored. Lungs clear to auscultation bilaterally.   No wheezes, crackles, or rhonchi.  Heart:  Regular rate and rhythm;  Without murmur, clicks, rubs or gallops Abdomen:  Soft, nondistended, nontender. Normal bowel sounds. No appreciable masses or hepatomegaly.  No rebound or guarding.  Rectal:  Not performed. Msk:  Symmetrical without gross deformities.  Strength generalized weakness Extremities:  Without edema, cyanosis or clubbing. Neurologic:  Alert and oriented x3;  grossly normal neurologically. Skin:  Intact without significant lesions or rashes. Psych:  Alert and cooperative. Normal affect.  LAB RESULTS:    Latest Ref Rng & Units 04/18/2022    9:13 AM 04/15/2022   12:10 PM 04/09/2022    7:34 AM  CBC  WBC 4.0 - 10.5 K/uL 10.6  6.2  6.5   Hemoglobin 13.0 - 17.0 g/dL 12.0  15.9  15.4   Hematocrit 39.0 - 52.0 % 36.1  47.0  46.4    Platelets 150 - 400 K/uL 56  61  101     BMET    Latest Ref Rng & Units 04/18/2022    9:13 AM 04/15/2022   12:10 PM 04/09/2022    7:34 AM  BMP  Glucose 70 - 99 mg/dL 117  116  81   BUN 6 - 20 mg/dL _0 Creatinine 0.61 - 1.24 mg/dL 1.12  1.03  1.23   Sodium 135 - 145 mmol/L 138  141  144   Potassium 3.5 - 5.1 mmol/L 3.9  3.4  4.0   Chloride 98 - 111 mmol/L 92  98  106   CO2 22 - 32 mmol/L _1 Calcium 8.9 - 10.3 mg/dL 9.3  8.9  8.6     LFT    Latest Ref Rng & Units 04/18/2022  9:13 AM 04/15/2022   12:10 PM 04/09/2022    7:34 AM  Hepatic Function  Total Protein 6.5 - 8.1 g/dL 7.9  8.4  7.2   Albumin 3.5 - 5.0 g/dL 5.0  5.0  4.4   AST 15 - 41 U/L 307  184  141   ALT 0 - 44 U/L 128  78  57   Alk Phosphatase 38 - 126 U/L 124  112  95   Total Bilirubin 0.3 - 1.2 mg/dL 1.9  1.5  0.9      STUDIES: US Abdomen Limited RUQ (LIVER/GB)  Result Date: 04/18/2022 CLINICAL DATA:  Elevated LFTs. EXAM: ULTRASOUND ABDOMEN LIMITED RIGHT UPPER QUADRANT COMPARISON:  CT scan 01/12/2021 FINDINGS: Gallbladder: No gallstones or gallbladder wall thickening. No pericholecystic fluid. The sonographer reports no sonographic Murphy's sign. Common bile duct: Diameter: 3 mm Liver: Heterogeneous coarsened parenchyma. No focal abnormality evident. Portal vein is patent on color Doppler imaging with normal direction of blood flow towards the liver. Other: None. IMPRESSION: Heterogeneous, coarsened hepatic parenchyma suggests geographic fatty deposition. No gallstones or biliary dilatation. Electronically Signed   By: Misty Stanley M.D.   On: 04/18/2022 12:16      Impression / Plan:   Cartrell Bentsen is a 36 y.o. male with history of alcohol abuse, alcohol withdrawal seizures, hypothyroidism, homeless presented with hematemesis and acute blood loss anemia.  No imaging evidence of cirrhosis or portal hypertension Hematemesis with acute blood loss anemia in setting of alcohol abuse: Differentials  include erosive esophagitis or Mallory-Weiss tear or peptic ulcer disease or Dieulafoy's lesion Continue pantoprazole drip Okay to continue octreotide drip Monitor CBC closely every 8-12 hours and transfuse to maintain hemoglobin above 8, platelets above 50 Discussed with patient regarding upper endoscopy tomorrow and he is agreeable Maintain n.p.o. except meds and ice chips  I have discussed alternative options, risks & benefits,  which include, but are not limited to, bleeding, infection, perforation,respiratory complication & drug reaction.  The patient agrees with this plan & written consent will be obtained.     Thank you for involving me in the care of this patient.      LOS: 0 days   Sherri Sear, MD  04/18/2022, 4:55 PM    Note: This dictation was prepared with Dragon dictation along with smaller phrase technology. Any transcriptional errors that result from this process are unintentional.

## 2022-04-18 NOTE — Consult Note (Signed)
NAME:  Joshua Medina, MRN:  [id number], DOB:  1985/09/24, LOS: 0 ADMISSION DATE:  04/18/2022, CONSULTATION DATE: 04/18/22 REFERRING MD: Dr. Sidney Ace, CHIEF COMPLAINT: Hematemesis    History of Present Illness:  This is a 36 yo male with a PMH of Current ETOH Abuse and Diabetes Mellitus.  He presented to [facility] ER on 08/29 via EMS after being [place] homeless [place] surrounded by blood.  Pt states he started vomiting on 08/28.  He denies black tarry stools or melena.  He reports he drinks a pint to a gallon of liquor daily.  He reports his last alcoholic beverage was yesterday.  He denies taking NSAIDS, goody powders, or blood thinners.    ED Course  Upon arrival to the ER pt noted to have dried blood around his nose and mouth.  Due to concern of possible variceal hemorrhage ER provider contacted on call Gastroenterologist Dr. Allegra Lai who recommended keeping pt NPO and pt will need an upper endoscopy during hospitalization.    Vital Signs: Temp 97.9 F, hr 132, bp 104/67, rr 12, O2 sats 100% Initial EKG: Sinus tachycardia, heart rate 107, no signs of ischemia  Lab results: Chloride 92, glucose 117, BUN 26, AST 307, ALT 128, total bilirubin 1.9, wbc 10.6, hgb 12.0, platelet count 56, and alcohol level 191  PCCM team contacted for possible ICU admission Pt evaluated by PCCM vital signs and hgb stable; no active signs of bleeding at this time; and not requiring vasopressor therapy.  No ICU needs at this time pt appropriate for hospitalist admission.  If you need further assistance please notify PCCM team by utilizing contact information in AMION.  Pertinent  Medical History  ETOH Abuse  Diabetes Mellitus  Hypothyroidism   Objective   Blood pressure 104/84, pulse 100, temperature 97.9 F (36.6 C), temperature source Oral, resp. rate (!) 22, height 5\' 10"  (1.778 m), weight 70.3 kg, SpO2 100 %.       No intake or output data in the 24 hours ending 04/18/22 1149 Filed Weights   04/18/22  0913  Weight: 70.3 kg    Examination: General: Acutely-ill appearing disheveled male, resting in bed NAD  HENT: Dried blood present around mouth and nose, no JVD  Lungs: Clear throughout, even, non labored  Cardiovascular: Sinus tachycardia, s1s2, no r/g, 2+ radial/1+ distal pulses, no edema  Abdomen: +BS x4, soft, non tender, non distended  Extremities: Normal bulk and tone, moves all extremities Neuro: Drowsy, oriented, following commands, PERRLA GU: Deferred  Labs   CBC: Recent Labs  Lab 04/15/22 1210 04/18/22 0913  WBC 6.2 10.6*  NEUTROABS  --  6.7  HGB 15.9 12.0*  HCT 47.0 36.1*  MCV 95.1 98.1  PLT 61* 56*    Basic Metabolic Panel: Recent Labs  Lab 04/15/22 1210 04/18/22 0913  NA 141 138  K 3.4* 3.9  CL 98 92*  CO2 27 28  GLUCOSE 116* 117*  BUN 17 26*  CREATININE 1.03 1.12  CALCIUM 8.9 9.3   GFR: Estimated Creatinine Clearance: 88.9 mL/min (by C-G formula based on SCr of 1.12 mg/dL). Recent Labs  Lab 04/15/22 1210 04/18/22 0913  WBC 6.2 10.6*    Liver Function Tests: Recent Labs  Lab 04/15/22 1210 04/18/22 0913  AST 184* 307*  ALT 78* 128*  ALKPHOS 112 124  BILITOT 1.5* 1.9*  PROT 8.4* 7.9  ALBUMIN 5.0 5.0   Recent Labs  Lab 04/18/22 0913  LIPASE 24   No results for  input(s): "AMMONIA" in the last 168 hours.  ABG    Component Value Date/Time   PHART 7.52 (H) 01/11/2021 2031   PCO2ART 29 (L) 01/11/2021 2031   PO2ART 91 01/11/2021 2031   HCO3 23.7 01/11/2021 2031   O2SAT 97.9 01/11/2021 2031     Coagulation Profile: Recent Labs  Lab 04/18/22 0913  INR 1.1    Cardiac Enzymes: No results for input(s): "CKTOTAL", "CKMB", "CKMBINDEX", "TROPONINI" in the last 168 hours.  HbA1C: No results found for: "HGBA1C"  CBG: No results for input(s): "GLUCAP" in the last 168 hours.  Review of Systems: Positives in BOLD   Gen: Denies fever, chills, weight change, fatigue, night sweats HEENT: Denies blurred vision, double vision,  hearing loss, tinnitus, sinus congestion, rhinorrhea, sore throat, neck stiffness, dysphagia PULM: Denies shortness of breath, cough, sputum production, hemoptysis, wheezing CV: Denies chest pain, edema, orthopnea, paroxysmal nocturnal dyspnea, palpitations GI: abdominal pain, nausea, hematemesis, diarrhea, hematochezia, melena, constipation, change in bowel habits GU: Denies dysuria, hematuria, polyuria, oliguria, urethral discharge Endocrine: Denies hot or cold intolerance, polyuria, polyphagia or appetite change Derm: Denies rash, dry skin, scaling or peeling skin change Heme: Denies easy bruising, bleeding, bleeding gums Neuro: Denies headache, numbness, weakness, slurred speech, loss of memory or consciousness   Past Medical History:  He,  has a past medical history of Diabetes mellitus without complication (HCC), Graves disease, and Thyroid disease.   Surgical History:   Past Surgical History:  Procedure Laterality Date   thyroid ablation with radioactive I-131       Social History:   reports that he has been smoking cigarettes. He has never used smokeless tobacco. He reports current alcohol use. He reports that he does not use drugs.   Family History:  His family history includes Diabetes in his paternal grandmother.   Allergies Allergies  Allergen Reactions   Precedex [Dexmedetomidine Hcl In Nacl] Other (See Comments)     Home Medications  Prior to Admission medications   Medication Sig Start Date End Date Taking? Authorizing Provider  levothyroxine (SYNTHROID) 100 MCG tablet Take 1 tablet (100 mcg total) by mouth daily. 01/17/21 02/16/21  Wouk, Wilfred Curtis, MD      Zada Girt, AGNP  Pulmonary/Critical Care Pager [phone] (please enter 7 digits) PCCM Consult Pager [phone] (please enter 7 digits)

## 2022-04-18 NOTE — ED Notes (Signed)
Pt voided on himself and provided a clean urinal, linen, and gown change. Call bell within reach - no other concerns at this time.

## 2022-04-18 NOTE — H&P (Addendum)
History and Physical    Joshua Medina CWC:[id number] DOB: [birth year] DOA: 04/18/2022  PCP: Pcp, No (Confirm with patient/family/NH records and if not entered, this has to be entered at [facility] point of entry) Patient coming from: Homeless  I have personally briefly reviewed patient's old medical records in [facility]  Chief Complaint: Throwing up blood  HPI: Joshua Medina is a 36 y.o. male with medical history significant of alcohol abuse, chronic thrombocytopenia secondary to alcohol abuse, fatty liver, hypothyroidism, was found by bystander on the street when he was swallowing large amount of blood.  Patient was still under the influence of alcohol and somewhat confused, but he denies any abdominal pain chest pain or black tarry stool.  According to ED staff, patient was homeless and found sitting on the side of the street and with large amount of blood clot around him.  Patient does admit feeling lightheaded.  He drinks half a gallon to 1 gallon liquor every day last drink this morning.  ED Course: Blood pressure was low and the tachycardia responded to IV bolus of 1000 mL Normal Saline.  Hemoglobin 12 compared to baseline > 15.  Blood work showed significant elevated AST ALT, platelet 56.  INR 1.1.  Alcohol level 131  Review of Systems: As per HPI otherwise 14 point review of systems negative.    Past Medical History:  Diagnosis Date   Diabetes mellitus without complication (HCC)    Graves disease    Thyroid disease     Past Surgical History:  Procedure Laterality Date   thyroid ablation with radioactive I-131       reports that he has been smoking cigarettes. He has never used smokeless tobacco. He reports current alcohol use. He reports that he does not use drugs.  Allergies  Allergen Reactions   Precedex [Dexmedetomidine Hcl In Nacl] Other (See Comments)    Family History  Problem Relation Age of Onset   Diabetes Paternal Grandmother      Prior to Admission  medications   Medication Sig Start Date End Date Taking? Authorizing Provider  levothyroxine (SYNTHROID) 100 MCG tablet Take 1 tablet (100 mcg total) by mouth daily. 01/17/21 02/16/21  Kathrynn Running, MD    Physical Exam: Vitals:   04/18/22 1000 04/18/22 1030 04/18/22 1100 04/18/22 1130  BP: 92/65 94/74 100/80 104/84  Pulse: (!) 110 (!) 112 (!) 101 100  Resp: 11 19 15  (!) 22  Temp:      TempSrc:      SpO2: 99% 100% 100% 100%  Weight:      Height:        Constitutional: NAD, calm, comfortable Vitals:   04/18/22 1000 04/18/22 1030 04/18/22 1100 04/18/22 1130  BP: 92/65 94/74 100/80 104/84  Pulse: (!) 110 (!) 112 (!) 101 100  Resp: 11 19 15  (!) 22  Temp:      TempSrc:      SpO2: 99% 100% 100% 100%  Weight:      Height:       Eyes: PERRL, lids and conjunctivae normal ENMT: Mucous membranes are moist. Posterior pharynx clear of any exudate or lesions.Normal dentition.  Neck: normal, supple, no masses, no thyromegaly Respiratory: clear to auscultation bilaterally, no wheezing, no crackles. Normal respiratory effort. No accessory muscle use.  Cardiovascular: Regular rate and rhythm, no murmurs / rubs / gallops. No extremity edema. 2+ pedal pulses. No carotid bruits.  Abdomen: no tenderness, no masses palpated. No hepatosplenomegaly. Bowel sounds positive.  Musculoskeletal: no clubbing /  cyanosis. No joint deformity upper and lower extremities. Good ROM, no contractures. Normal muscle tone.  Skin: no rashes, lesions, ulcers. No induration Neurologic: CN 2-12 grossly intact. Sensation intact, DTR normal. Strength 5/5 in all 4.  Psychiatric: Sleepy, oriented to person and place, confused about time   Labs on Admission: I have personally reviewed following labs and imaging studies  CBC: Recent Labs  Lab 04/15/22 1210 04/18/22 0913  WBC 6.2 10.6*  NEUTROABS  --  6.7  HGB 15.9 12.0*  HCT 47.0 36.1*  MCV 95.1 98.1  PLT 61* 56*   Basic Metabolic Panel: Recent Labs  Lab  04/15/22 1210 04/18/22 0913  NA 141 138  K 3.4* 3.9  CL 98 92*  CO2 27 28  GLUCOSE 116* 117*  BUN 17 26*  CREATININE 1.03 1.12  CALCIUM 8.9 9.3   GFR: Estimated Creatinine Clearance: 88.9 mL/min (by C-G formula based on SCr of 1.12 mg/dL). Liver Function Tests: Recent Labs  Lab 04/15/22 1210 04/18/22 0913  AST 184* 307*  ALT 78* 128*  ALKPHOS 112 124  BILITOT 1.5* 1.9*  PROT 8.4* 7.9  ALBUMIN 5.0 5.0   Recent Labs  Lab 04/18/22 0913  LIPASE 24   No results for input(s): "AMMONIA" in the last 168 hours. Coagulation Profile: Recent Labs  Lab 04/18/22 0913  INR 1.1   Cardiac Enzymes: No results for input(s): "CKTOTAL", "CKMB", "CKMBINDEX", "TROPONINI" in the last 168 hours. BNP (last 3 results) No results for input(s): "PROBNP" in the last 8760 hours. HbA1C: No results for input(s): "HGBA1C" in the last 72 hours. CBG: No results for input(s): "GLUCAP" in the last 168 hours. Lipid Profile: No results for input(s): "CHOL", "HDL", "LDLCALC", "TRIG", "CHOLHDL", "LDLDIRECT" in the last 72 hours. Thyroid Function Tests: No results for input(s): "TSH", "T4TOTAL", "FREET4", "T3FREE", "THYROIDAB" in the last 72 hours. Anemia Panel: No results for input(s): "VITAMINB12", "FOLATE", "FERRITIN", "TIBC", "IRON", "RETICCTPCT" in the last 72 hours. Urine analysis:    Component Value Date/Time   COLORURINE YELLOW (A) 11/15/2021 2157   APPEARANCEUR HAZY (A) 11/15/2021 2157   LABSPEC 1.017 11/15/2021 2157   PHURINE 6.0 11/15/2021 2157   GLUCOSEU NEGATIVE 11/15/2021 2157   HGBUR NEGATIVE 11/15/2021 2157   BILIRUBINUR NEGATIVE 11/15/2021 2157   KETONESUR NEGATIVE 11/15/2021 2157   PROTEINUR 100 (A) 11/15/2021 2157   NITRITE NEGATIVE 11/15/2021 2157   LEUKOCYTESUR NEGATIVE 11/15/2021 2157    Radiological Exams on Admission: US Abdomen Limited RUQ (LIVER/GB)  Result Date: 04/18/2022 CLINICAL DATA:  Elevated LFTs. EXAM: ULTRASOUND ABDOMEN LIMITED RIGHT UPPER QUADRANT  COMPARISON:  CT scan 01/12/2021 FINDINGS: Gallbladder: No gallstones or gallbladder wall thickening. No pericholecystic fluid. The sonographer reports no sonographic Murphy's sign. Common bile duct: Diameter: 3 mm Liver: Heterogeneous coarsened parenchyma. No focal abnormality evident. Portal vein is patent on color Doppler imaging with normal direction of blood flow towards the liver. Other: None. IMPRESSION: Heterogeneous, coarsened hepatic parenchyma suggests geographic fatty deposition. No gallstones or biliary dilatation. Electronically Signed   By: Kennith Center M.D.   On: 04/18/2022 12:16    EKG: Independently reviewed.  Sinus tachycardia, no acute ST changes.  Assessment/Plan Principal Problem:   GI bleed  (please populate well all problems here in Problem List. (For example, if patient is on BP meds at home and you resume or decide to hold them, it is a problem that needs to be her. Same for CAD, COPD, HLD and so on)  Hematemesis and acute blood loss anemia -Differential including varices  esophageal bleeding, Mallory-Weiss, alcoholic gastritis/ulcer -GI consulted, n.p.o., EGD inpatient -Check H&H every 6 hours transfuse for hemodynamic instability or drastic drop of H&H -Continue PPI twice daily, Sandostatin drip and ceftriaxone for SBP prevention.  Acute on chronic thrombocytopenia -No indication for platelet pheresis at this point.  Etiology appears to be more related to alcohol abuse rather than cirrhosis-splenomegaly  Acute metabolic encephalopathy -Secondary to alcohol intoxication -CIWA protocol to prevent withdrawal -N.p.o., IV fluids.  Acute on chronic alcoholic hepatitis -Discriminant factor<32, no indication for steroid. -RUQ ultrasound  Hypothyroidism -Noncompliant with Synthroid, check TSH.   DVT prophylaxis: SCD Code Status: Full code Family Communication: Patient is homeless Disposition Plan: Patient sick with upper GI bleed, requiring inpatient GI  intervention, expect more than 2 midnight hospital stay. Consults called: GI, ICU recommend admit to PCU Admission status: PCU   Emeline General MD [facility] Pager [phone]  04/18/2022, 12:33 PM

## 2022-04-18 NOTE — ED Provider Notes (Signed)
[facility] Provider Note    Event Date/Time   First MD Initiated Contact with Patient 04/18/22 [phone]     (approximate)   History   Emesis (EMS reports large amount w/ large blood clots)   HPI  Joshua Medina is a 36 y.o. male past medical history of diabetes and chronic alcohol use disorder who presents with hematemesis.  Patient was [place] up by EMS at a homeless camp.  He was covered in hematemesis and clots.  Apparently there was a significant amount of blood surrounding him on the ground as well.  Patient tells me that he has been vomiting through the night about once every hour.  He did not notice any blood.  Did not taste like blood.  Denies history of similar last bowel movement was several days ago denies any melena or blood in his stool.  Denies abdominal pain.  He drinks about a pint to a gallon of liquor daily.  Last drink several ounces this morning.  Does have history of significant withdrawal she says he has had to be admitted for but unsure of any prior ICU admissions or intubations for DTs.  No known history of cirrhosis.    Past Medical History:  Diagnosis Date   Diabetes mellitus without complication (HCC)    Graves disease    Thyroid disease     Patient Active Problem List   Diagnosis Date Noted   Altered mental status 01/11/2021   Hyponatremia 01/11/2021   Hypotension 06/10/2020   Hypoglycemia without diagnosis of diabetes mellitus 06/10/2020   Alcohol withdrawal seizure (HCC) 06/04/2020   Malnutrition of moderate degree 02/17/2020   Hypomagnesemia 02/16/2020   Delirium tremens (HCC) 02/16/2020   Alcohol use disorder, moderate, dependence (HCC) 02/15/2020   Hypothyroidism (acquired) 02/15/2020   Alcoholic liver disease (HCC) 02/15/2020   Alcohol withdrawal seizure with complication, with delirium (HCC) 02/15/2020   Alcohol withdrawal seizure, with delirium (HCC) 02/15/2020   Thrombocytopenia (HCC) 02/15/2020   Anemia 02/15/2020    Hypokalemia 02/15/2020     Physical Exam  Triage Vital Signs: ED Triage Vitals  Enc Vitals Group     BP 04/18/22 0923 104/67     Pulse Rate 04/18/22 0923 (!) 137     Resp 04/18/22 0923 18     Temp 04/18/22 0913 97.9 F (36.6 C)     Temp Source 04/18/22 0913 Oral     SpO2 04/18/22 0909 100 %     Weight 04/18/22 0913 155 lb (70.3 kg)     Height 04/18/22 0913 5\' 10"  (1.778 m)     Head Circumference --      Peak Flow --      Pain Score 04/18/22 0913 0     Pain Loc --      Pain Edu? --      Excl. in GC? --     Most recent vital signs: Vitals:   04/18/22 1030 04/18/22 1100  BP: 94/74 100/80  Pulse: (!) 112 (!) 101  Resp: 19 15  Temp:    SpO2: 100% 100%     General: Awake, patient is chronically ill-appearing CV:  Good peripheral perfusion.  No peripheral edema, warm extremities Resp:  Normal effort.  Abd:  No distention.  Abdomen is soft and nontender Neuro:             Awake, Alert, Oriented x 3  Other:  Patient is mildly tremulous he is alert and oriented not confused Is dried blood on his  face nares and short   ED Results / Procedures / Treatments  Labs (all labs ordered are listed, but only abnormal results are displayed) Labs Reviewed  COMPREHENSIVE METABOLIC PANEL - Abnormal; Notable for the following components:      Result Value   Chloride 92 (*)    Glucose, Bld 117 (*)    BUN 26 (*)    AST 307 (*)    ALT 128 (*)    Total Bilirubin 1.9 (*)    Anion gap 18 (*)    All other components within normal limits  CBC WITH DIFFERENTIAL/PLATELET - Abnormal; Notable for the following components:   WBC 10.6 (*)    RBC 3.68 (*)    Hemoglobin 12.0 (*)    HCT 36.1 (*)    Platelets 56 (*)    Abs Immature Granulocytes 0.08 (*)    All other components within normal limits  ETHANOL - Abnormal; Notable for the following components:   Alcohol, Ethyl (B) 191 (*)    All other components within normal limits  PROTIME-INR  APTT  LIPASE, BLOOD  PREPARE RBC (CROSSMATCH)   TYPE AND SCREEN  ABO/RH     EKG  EKG interpretation performed by myself: NSR, nml axis, nml intervals, no acute ischemic changes    RADIOLOGY    PROCEDURES:  Critical Care performed: Yes, see critical care procedure note(s)  .1-3 Lead EKG Interpretation  Performed by: Georga Hacking, MD Authorized by: Georga Hacking, MD     Interpretation: abnormal     ECG rate assessment: tachycardic     Rhythm: sinus tachycardia     Ectopy: none     Conduction: normal   .Critical Care  Performed by: Georga Hacking, MD Authorized by: Georga Hacking, MD   Critical care provider statement:    Critical care time (minutes):  30   Critical care was time spent personally by me on the following activities:  Development of treatment plan with patient or surrogate, discussions with consultants, evaluation of patient's response to treatment, examination of patient, ordering and review of laboratory studies, ordering and review of radiographic studies, ordering and performing treatments and interventions, pulse oximetry, re-evaluation of patient's condition and review of old charts   The patient is on the cardiac monitor to evaluate for evidence of arrhythmia and/or significant heart rate changes.   MEDICATIONS ORDERED IN ED: Medications  0.9 %  sodium chloride infusion (10 mL/hr Intravenous Not Given 04/18/22 1023)  octreotide (SANDOSTATIN) 2 mcg/mL load via infusion 50 mcg (50 mcg Intravenous Bolus from Bag 04/18/22 1008)    And  octreotide (SANDOSTATIN) 500 mcg in sodium chloride 0.9 % 250 mL (2 mcg/mL) infusion (50 mcg/hr Intravenous New Bag/Given 04/18/22 1008)  sodium chloride 0.9 % bolus 1,000 mL (1,000 mLs Intravenous New Bag/Given 04/18/22 0941)  cefTRIAXone (ROCEPHIN) 2 g in sodium chloride 0.9 % 100 mL IVPB (0 g Intravenous Stopped 04/18/22 1010)  diazepam (VALIUM) injection 5 mg (5 mg Intravenous Given 04/18/22 0943)  pantoprazole (PROTONIX) 80 mg /NS 100 mL IVPB (0 mg  Intravenous Stopped 04/18/22 1031)  thiamine (VITAMIN B1) injection 100 mg (100 mg Intravenous Given 04/18/22 1037)     IMPRESSION / MDM / ASSESSMENT AND PLAN / ED COURSE  I reviewed the triage vital signs and the nursing notes.                              Patient's presentation is  most consistent with acute presentation with potential threat to life or bodily function.  Differential diagnosis includes, but is not limited to, variceal hemorrhage, bleeding ulcer, Mallory-Weiss tear, gastritis  The patient is a 36 year old male with history of heavy chronic alcohol use disorder who presents with hematemesis.  Patient has been vomiting since yesterday evening did not actually notice any blood.  When EMS arrived they said that he was surrounded by blood and clots.  Patient is tachycardic and borderline hypotensive on arrival.  He looks much older than stated age she has dried blood in his nares and on his face and shirt.  Denies any blood in his stool or melena.  Patient drinks about a pint to a gallon of liquor per day.  Abdomen is benign he is alert and oriented just mildly tremulous.  I am significantly concerned for variceal hemorrhage.  Will type and cross start Protonix Rocephin octreotide bolus and infusion.  I also think patient is likely having some early alcohol withdrawal so we will give Valium.  We will start with a bolus of fluid as he is likely volume down from GI losses.  I have ordered him a unit of blood as well.  Will need admission.  His labs are notable for hemoglobin of 12 which is down from 16 4 days ago.  AST and ALT mildly elevated 307 and 128 respectively.  BUN elevated at 26.  Alcohol level is 191.  Coags are normal platelets 56.  We will hold off on transfusion given his hemoglobin will have a low threshold to initiate if has another episode of hematemesis down here.  Heart rates have come down slightly to the 120s.  I have sent a page out to Dr. Allegra Lai but she is in an endoscopy  procedure currently.     I spoke with Dr. Allegra Lai who said to keep the patient n.p.o. and will need endoscopy unclear timing of this.  I discussed the case with Dr. Belia Heman and his team evaluated the patient at the bedside and feels that he is stable for stepdown.  FINAL CLINICAL IMPRESSION(S) / ED DIAGNOSES   Final diagnoses:  UGIB (upper gastrointestinal bleed)     Rx / DC Orders   ED Discharge Orders     None        Note:  This document was prepared using Dragon voice recognition software and may include unintentional dictation errors.   Georga Hacking, MD 04/18/22 1124

## 2022-04-19 ENCOUNTER — Inpatient Hospital Stay: Payer: BLUE CROSS/BLUE SHIELD | Admitting: Anesthesiology

## 2022-04-19 ENCOUNTER — Encounter
Admission: EM | Disposition: A | Discharge status: Home / Self Care | Payer: Self-pay | Source: Home / Self Care | Attending: Hospitalist

## 2022-04-19 ENCOUNTER — Encounter: Payer: Self-pay | Admitting: Internal Medicine

## 2022-04-19 DIAGNOSIS — K2990 Gastroduodenitis, unspecified, without bleeding: Secondary | ICD-10-CM

## 2022-04-19 DIAGNOSIS — K922 Gastrointestinal hemorrhage, unspecified: Secondary | ICD-10-CM | POA: Diagnosis not present

## 2022-04-19 DIAGNOSIS — K2970 Gastritis, unspecified, without bleeding: Secondary | ICD-10-CM | POA: Diagnosis not present

## 2022-04-19 DIAGNOSIS — K226 Gastro-esophageal laceration-hemorrhage syndrome: Secondary | ICD-10-CM | POA: Diagnosis not present

## 2022-04-19 HISTORY — PX: ESOPHAGOGASTRODUODENOSCOPY (EGD) WITH PROPOFOL: SHX5813

## 2022-04-19 LAB — BPAM RBC
Blood Product Expiration Date: 202310062359
ISSUE DATE / TIME: 202308291807
Unit Type and Rh: 5100

## 2022-04-19 LAB — COMPREHENSIVE METABOLIC PANEL
ALT: 63 U/L — ABNORMAL HIGH (ref 0–44)
AST: 127 U/L — ABNORMAL HIGH (ref 15–41)
Albumin: 3.4 g/dL — ABNORMAL LOW (ref 3.5–5.0)
Alkaline Phosphatase: 71 U/L (ref 38–126)
Anion gap: 8 (ref 5–15)
BUN: 19 mg/dL (ref 6–20)
CO2: 29 mmol/L (ref 22–32)
Calcium: 7.2 mg/dL — ABNORMAL LOW (ref 8.9–10.3)
Chloride: 100 mmol/L (ref 98–111)
Creatinine, Ser: 1 mg/dL (ref 0.61–1.24)
GFR, Estimated: >60 mL/min (ref >60)
Glucose, Bld: 98 mg/dL (ref 70–99)
Potassium: 3.8 mmol/L (ref 3.5–5.1)
Sodium: 137 mmol/L (ref 135–145)
Total Bilirubin: 1.3 mg/dL — ABNORMAL HIGH (ref 0.3–1.2)
Total Protein: 5.3 g/dL — ABNORMAL LOW (ref 6.5–8.1)

## 2022-04-19 LAB — HIV ANTIBODY (ROUTINE TESTING W REFLEX): HIV Screen 4th Generation wRfx: NONREACTIVE

## 2022-04-19 LAB — TYPE AND SCREEN
ABO/RH(D): O POS
Antibody Screen: NEGATIVE
Unit division: 0

## 2022-04-19 LAB — PLATELET COUNT: Platelets: 71 10*3/uL — ABNORMAL LOW (ref 150–400)

## 2022-04-19 LAB — CBC
HCT: 24.4 % — ABNORMAL LOW (ref 39.0–52.0)
Hemoglobin: 8.2 g/dL — ABNORMAL LOW (ref 13.0–17.0)
MCH: 32.3 pg (ref 26.0–34.0)
MCHC: 33.6 g/dL (ref 30.0–36.0)
MCV: 96.1 fL (ref 80.0–100.0)
Platelets: 28 10*3/uL — CL (ref 150–400)
RBC: 2.54 MIL/uL — ABNORMAL LOW (ref 4.22–5.81)
RDW: 17.4 % — ABNORMAL HIGH (ref 11.5–15.5)
WBC: 5.4 10*3/uL (ref 4.0–10.5)
nRBC: 0 % (ref 0.0–0.2)

## 2022-04-19 LAB — VITAMIN B12: Vitamin B-12: 352 pg/mL (ref 180–914)

## 2022-04-19 LAB — T4, FREE: Free T4: <0.25 ng/dL — ABNORMAL LOW (ref 0.61–1.12)

## 2022-04-19 LAB — FOLATE: Folate: 26 ng/mL (ref >5.9)

## 2022-04-19 SURGERY — ESOPHAGOGASTRODUODENOSCOPY (EGD) WITH PROPOFOL
Anesthesia: General

## 2022-04-19 MED ORDER — KETAMINE HCL 10 MG/ML IJ SOLN
INTRAMUSCULAR | Status: DC | PRN
  Administered 2022-04-19: 20 mg via INTRAVENOUS

## 2022-04-19 MED ORDER — SODIUM CHLORIDE 0.9 % IV SOLN: INTRAVENOUS | Status: DC

## 2022-04-19 MED ORDER — MIDAZOLAM HCL 2 MG/2ML IJ SOLN
INTRAMUSCULAR | Status: AC
  Filled 2022-04-19: qty 2

## 2022-04-19 MED ORDER — KETAMINE HCL 50 MG/5ML IJ SOSY
PREFILLED_SYRINGE | INTRAMUSCULAR | Status: AC
  Filled 2022-04-19: qty 5

## 2022-04-19 MED ORDER — PROPOFOL 1000 MG/100ML IV EMUL
INTRAVENOUS | Status: AC
  Filled 2022-04-19: qty 100

## 2022-04-19 MED ORDER — MIDAZOLAM HCL 2 MG/2ML IJ SOLN
INTRAMUSCULAR | Status: DC | PRN
  Administered 2022-04-19: 4 mg via INTRAVENOUS

## 2022-04-19 MED ORDER — BOOST / RESOURCE BREEZE PO LIQD CUSTOM
1.0000 | Freq: Three times a day (TID) | ORAL | Status: DC
  Administered 2022-04-19 – 2022-04-21 (×5): 1 via ORAL

## 2022-04-19 MED ORDER — PROPOFOL 10 MG/ML IV BOLUS
INTRAVENOUS | Status: DC | PRN
  Administered 2022-04-19: 100 mg via INTRAVENOUS

## 2022-04-19 MED ORDER — SODIUM CHLORIDE 0.9% IV SOLUTION: Freq: Once | INTRAVENOUS | Status: AC

## 2022-04-19 MED ORDER — PROPOFOL 500 MG/50ML IV EMUL
INTRAVENOUS | Status: DC | PRN
  Administered 2022-04-19: 100 ug/kg/min via INTRAVENOUS

## 2022-04-19 MED ORDER — LEVOTHYROXINE SODIUM 100 MCG PO TABS
100.0000 ug | ORAL_TABLET | Freq: Every day | ORAL | Status: DC
  Administered 2022-04-20 – 2022-04-21 (×2): 100 ug via ORAL
  Filled 2022-04-19 (×2): qty 1

## 2022-04-19 NOTE — Progress Notes (Signed)
  PROGRESS NOTE    Joshua Medina  FIE:[id number] DOB: 1986-02-20 DOA: 04/18/2022 PCP: Pcp, No  260A/260A-AA  LOS: 1 day   Brief hospital course: No notes on file  Assessment & Plan: Joshua Medina is a 36 y.o. male with medical history significant of alcohol abuse, chronic thrombocytopenia secondary to alcohol abuse, fatty liver, hypothyroidism, was found by bystander on the street when he was swallowing large amount of blood.   Patient was still under the influence of alcohol and somewhat confused, but he denies any abdominal pain chest pain or black tarry stool.  According to ED staff, patient was homeless and found sitting on the side of the street and with large amount of blood clot around him.  Patient does admit feeling lightheaded.  He drinks half a gallon to 1 gallon liquor every day last drink morning of presentation.   Hematemesis and acute blood loss anemia -Differential including varices esophageal bleeding, Mallory-Weiss, alcoholic gastritis/ulcer Plan: --EGD today -Continue PPI twice daily, Sandostatin drip and ceftriaxone for SBP prevention.   Acute on chronic thrombocytopenia --1u plt today for plt of 28   Acute toxic encephalopathy -Secondary to alcohol intoxication -CIWA protocol  --thiamine and folic acid   Acute on chronic alcoholic hepatitis -Discriminant factor<32, no indication for steroid.   Hypothyroidism -Noncompliant with Synthroid --TSH 46, free T4 <0.25 --resume Synthroid 100 mcg   DVT prophylaxis: SCD/Compression stockings Code Status: Full code  Family Communication:  Level of care: Progressive Dispo:   The patient is from: homeless Anticipated d/c is to: shelter Anticipated d/c date is: tomorrow   Subjective and Interval History:  Pt denied pain, denied dyspnea.  No more hematemesis.   Objective: Vitals:   04/19/22 1646 04/19/22 1656 04/19/22 1706 04/19/22 1831  BP: (!) 93/57 90/71 99/76  97/73  Pulse: 85   (!) 58  Resp: (!) 9 17 (!)  9 (!) 9  Temp: 98 F (36.7 C)   98.5 F (36.9 C)  TempSrc: Temporal   Oral  SpO2: 99%   100%  Weight:      Height:        Intake/Output Summary (Last 24 hours) at 04/19/2022 1924 Last data filed at 04/19/2022 1105 Gross per 24 hour  Intake 440.84 ml  Output 900 ml  Net -459.16 ml   Filed Weights   04/18/22 0913 04/19/22 1548  Weight: 70.3 kg 70.3 kg    Examination:   Constitutional: NAD, AAOx3 HEENT: conjunctivae and lids normal, EOMI CV: No cyanosis.   RESP: normal respiratory effort, on RA Neuro: II - XII grossly intact.   Psych: Normal mood and affect.     Data Reviewed: I have personally reviewed labs and imaging studies  Time spent: 35 minutes  04/21/22, MD [facility] If 7PM-7AM, please contact night-coverage 04/19/2022, 7:24 PM

## 2022-04-19 NOTE — Op Note (Signed)
[facility] Gastroenterology Patient Name: Joshua Medina Procedure Date: 04/19/2022 3:55 PM MRN: [id number] Account #: [id number] Date of Birth: 09-11-1985 Admit Type: Inpatient Age: 36 Room: [facility] ENDO ROOM 4 Gender: Male Note Status: Finalized Instrument Name: Upper Endoscope [id number] Procedure:             Upper GI endoscopy Indications:           Acute post hemorrhagic anemia, Hematemesis Providers:             Toney Reil MD, MD Referring MD:          No Local Md, MD (Referring MD) Medicines:             General Anesthesia Complications:         No immediate complications. Estimated blood loss: None. Procedure:             Pre-Anesthesia Assessment:                        - Prior to the procedure, a History and Physical was                         performed, and patient medications and allergies were                         reviewed. The patient is competent. The risks and                         benefits of the procedure and the sedation options and                         risks were discussed with the patient. All questions                         were answered and informed consent was obtained.                         Patient identification and proposed procedure were                         verified by the physician, the nurse, the                         anesthesiologist, the anesthetist and the technician                         in the pre-procedure area in the procedure room in the                         endoscopy suite. Mental Status Examination: alert and                         oriented. Airway Examination: normal oropharyngeal                         airway and neck mobility. Respiratory Examination:                         clear to auscultation. CV Examination: normal.  Prophylactic Antibiotics: The patient does not require                         prophylactic antibiotics. Prior Anticoagulants: The                          patient has taken no previous anticoagulant or                         antiplatelet agents. ASA Grade Assessment: III - A                         patient with severe systemic disease. After reviewing                         the risks and benefits, the patient was deemed in                         satisfactory condition to undergo the procedure. The                         anesthesia plan was to use general anesthesia.                         Immediately prior to administration of medications,                         the patient was re-assessed for adequacy to receive                         sedatives. The heart rate, respiratory rate, oxygen                         saturations, blood pressure, adequacy of pulmonary                         ventilation, and response to care were monitored                         throughout the procedure. The physical status of the                         patient was re-assessed after the procedure.                        After obtaining informed consent, the endoscope was                         passed under direct vision. Throughout the procedure,                         the patient's blood pressure, pulse, and oxygen                         saturations were monitored continuously. The Endoscope                         was introduced through the mouth, and advanced to the  second part of duodenum. The upper GI endoscopy was                         accomplished without difficulty. The patient tolerated                         the procedure well. Findings:      The duodenal bulb and second portion of the duodenum were normal.      Diffuse severe inflammation characterized by congestion (edema),       erosions, friability and linear erosions was found in the gastric fundus.      Diffuse moderately congested mucosa was found in the entire examined       stomach. Biopsies were taken with a cold forceps for histology. Biopsies       were  taken with a cold forceps for histology.      A small hiatal hernia was present.      A 10 mm non-bleeding Mallory-Weiss tear with stigmata of recent       bleeding, organized blood clot was found. For hemostasis, one hemostatic       clip was successfully placed. There was no bleeding during, or at the       end, of the procedure. Impression:            - Normal duodenal bulb and second portion of the                         duodenum.                        - Alcoholic gastritis.                        - Congestive gastropathy. Biopsied.                        - Small hiatal hernia.                        - Mallory-Weiss tear. Clip was placed. Recommendation:        - Return patient to hospital ward for ongoing care.                        - Mechanical soft diet today.                        - Continue present medications.                        - Follow an antireflux regimen indefinitely.                        - Use Prilosec (omeprazole) 40 mg PO BID indefinitely. Procedure Code(s):     --- Professional ---                        43255, 59, Esophagogastroduodenoscopy, flexible,                         transoral; with control of bleeding, any method  09326, Esophagogastroduodenoscopy, flexible,                         transoral; with biopsy, single or multiple Diagnosis Code(s):     --- Professional ---                        K29.20, Alcoholic gastritis without bleeding                        K31.89, Other diseases of stomach and duodenum                        K44.9, Diaphragmatic hernia without obstruction or                         gangrene                        K22.6, Gastro-esophageal laceration-hemorrhage syndrome                        D62, Acute posthemorrhagic anemia                        K92.0, Hematemesis CPT copyright 2019 American Medical Association. All rights reserved. The codes documented in this report are preliminary and upon coder review  may  be revised to meet current compliance requirements. Dr. Libby Maw Toney Reil MD, MD 04/19/2022 4:43:54 PM This report has been signed electronically. Number of Addenda: 0 Note Initiated On: 04/19/2022 3:55 PM Estimated Blood Loss:  Estimated blood loss: none.      [facility]

## 2022-04-19 NOTE — Progress Notes (Signed)
       CROSS COVER NOTE  NAME: Joshua Medina MRN: [id number] DOB : Jan 08, 1986    Date of Service   04/19/2022   HPI/Events of Note   Notified of critical PLT result this morning --> PLT 28  Interventions   Plan: Transfuse 1U Platelets for goal PLT >50     This document was prepared using Dragon voice recognition software and may include unintentional dictation errors.  Bishop Limbo DNP, MHA, FNP-BC Nurse Practitioner [facility] Pager [phone]

## 2022-04-19 NOTE — ED Notes (Signed)
Informed RN bed assigned 

## 2022-04-19 NOTE — TOC Initial Note (Signed)
Transition of Care [facility]) - Initial/Assessment Note    Patient Details  Name: Joshua Medina MRN: [id number] Date of Birth: 12-20-1985  Transition of Care [facility]) CM/SW Contact:    Shelbie Hutching, RN Phone Number: 04/19/2022, 4:12 PM  Clinical Narrative:                 Patient admitted to the hospital for GI bleeding, scheduled to have an EGD this afternoon.  Patient has a history of alcohol abuse and has been seen multiple times in the ED for alcohol intoxication.  RNCM met with patient at the bedside in the emergency department.  Asked patient if he had somewhere to stay and he reports he does and he reports that at discharge he can call someone to pick him up.  He reports that he does drink alcohol and that is what caused his problem now.  RNCM provided him with information on Substance abuse treatment, inpatient and outpatient treatment facilities that if he chooses he can follow up with at discharge. Patient denies any other needs at this time.  Encouraged him to get a PCP and follow up after discharge, he has insurance.  He is not currently working anywhere.    Expected Discharge Plan: Home/Self Care Barriers to Discharge: Continued Medical Work up   Patient Goals and CMS Choice Patient states their goals for this hospitalization and ongoing recovery are:: does not state goals at this time      Expected Discharge Plan and Services Expected Discharge Plan: Home/Self Care   Discharge Planning Services: CM Consult   Living arrangements for the past 2 months: Homeless                 DME Arranged: N/A DME Agency: NA       HH Arranged: NA [place] Agency: NA        Prior Living Arrangements/Services Living arrangements for the past 2 months: Homeless Lives with:: Self Patient language and need for interpreter reviewed:: Yes Do you feel safe going back to the place where you live?: Yes      Need for Family Participation in Patient Care: Yes (Comment) Care giver support system  in place?: No (comment)   Criminal Activity/Legal Involvement Pertinent to Current Situation/Hospitalization: No - Comment as needed  Activities of Daily Living Home Assistive Devices/Equipment: None ADL Screening (condition at time of admission) Patient's cognitive ability adequate to safely complete daily activities?: Yes Is the patient deaf or have difficulty hearing?: No Does the patient have difficulty seeing, even when wearing glasses/contacts?: No Does the patient have difficulty concentrating, remembering, or making decisions?: No Patient able to express need for assistance with ADLs?: Yes Does the patient have difficulty dressing or bathing?: No Independently performs ADLs?: Yes (appropriate for developmental age) Does the patient have difficulty walking or climbing stairs?: No Weakness of Legs: None Weakness of Arms/Hands: None  Permission Sought/Granted   Permission granted to share information with : No              Emotional Assessment Appearance:: Appears stated age Attitude/Demeanor/Rapport: Engaged Affect (typically observed): Accepting Orientation: : Oriented to Self, Oriented to Place, Oriented to  Time, Oriented to Situation Alcohol / Substance Use: Alcohol Use Psych Involvement: No (comment)  Admission diagnosis:  GI bleed [K92.2] UGIB (upper gastrointestinal bleed) [K92.2] Patient Active Problem List   Diagnosis Date Noted   GI bleed 04/18/2022   Hematemesis with nausea    UGIB (upper gastrointestinal bleed)    Altered mental  status 01/11/2021   Hyponatremia 01/11/2021   Hypotension 06/10/2020   Hypoglycemia without diagnosis of diabetes mellitus 06/10/2020   Alcohol withdrawal seizure ([place]) 06/04/2020   Malnutrition of moderate degree 02/17/2020   Hypomagnesemia 02/16/2020   Delirium tremens ([place]) 02/16/2020   Alcohol use disorder, moderate, dependence ([place]) 02/15/2020   Hypothyroidism (acquired) 42/68/3419   Alcoholic liver disease ([place])  02/15/2020   Alcohol withdrawal seizure with complication, with delirium ([place]) 02/15/2020   Alcohol withdrawal seizure, with delirium ([place]) 02/15/2020   Thrombocytopenia ([place]) 02/15/2020   Anemia 02/15/2020   Hypokalemia 02/15/2020   PCP:  Merryl Hacker No Pharmacy:   [facility] [id number]), [place]) [place] [address] Phone: [phone] Fax: [phone]     Social Determinants of Health (SDOH) Interventions    Readmission Risk Interventions    04/19/2022    3:44 PM  Readmission Risk Prevention Plan  Transportation Screening Complete  PCP or Specialist Appt within 3-5 Days Complete  HRI or [place] Not Complete  HRI or Home Care Consult comments NA  Social Work Consult for [place] Planning/Counseling Complete  Palliative Care Screening Not Applicable  Medication Review Press photographer) Complete

## 2022-04-19 NOTE — Anesthesia Preprocedure Evaluation (Addendum)
Anesthesia Evaluation  Patient identified by MRN, date of birth, ID band Patient awake    Reviewed: Allergy & Precautions, NPO status , Patient's Chart, lab work & pertinent test results  History of Anesthesia Complications Negative for: history of anesthetic complications  Airway Mallampati: III  TM Distance: >3 FB Neck ROM: full    Dental  (+) Chipped, Dental Advidsory Given, Poor Dentition   Pulmonary neg shortness of breath, neg COPD, neg recent URI, Current Smoker and Patient abstained from smoking.,    Pulmonary exam normal        Cardiovascular Exercise Tolerance: Good negative cardio ROS Normal cardiovascular exam     Neuro/Psych Seizures - (from alcohol), Well Controlled,  negative psych ROS   GI/Hepatic (+)     substance abuse (drinks half a gallon to 1 gallon liquor every day)  alcohol use, hematemesis   Endo/Other  diabetes (borderline)Hypothyroidism   Renal/GU Renal disease     Musculoskeletal   Abdominal   Peds  Hematology  (+) Blood dyscrasia, anemia , Acute decline with history of chronic thrombocytopenia   Anesthesia Other Findings History of alcohol abuse, alcohol withdrawal seizures, diabetes, hypothyroidism presented with hematemesis. Last drink yesterday. Labs were significant for hemoglobin 12, dropped from 15.93 days ago, platelets 56, elevated BUN/creatinine ratio 26/1.12, elevated LFTs AST 307, ALT 128, T. bili 1.9.  Normal serum lipase, PT/INR normal, serum alcohol levels 190. Patient is started on pantoprazole drip, octreotide drip and ceftriaxone. Differentials include erosive esophagitis or Mallory-Weiss tear or peptic ulcer disease or Dieulafoy's lesion.  S/p 1 prbc and 1 platelets  Past Medical History: No date: Diabetes mellitus without complication (HCC) No date: Graves disease No date: Thyroid disease  Past Surgical History: No date: thyroid ablation with radioactive  I-131  BMI    Body Mass Index: 22.24 kg/m      Reproductive/Obstetrics negative OB ROS                            Anesthesia Physical Anesthesia Plan  ASA: 3  Anesthesia Plan: General   Post-op Pain Management:    Induction: Intravenous  PONV Risk Score and Plan: Treatment may vary due to age or medical condition, Propofol infusion and TIVA  Airway Management Planned: Natural Airway and Nasal Cannula  Additional Equipment:   Intra-op Plan:   Post-operative Plan:   Informed Consent:   Plan Discussed with: Anesthesiologist, CRNA and Surgeon  Anesthesia Plan Comments: (Platelets pending)       Anesthesia Quick Evaluation

## 2022-04-19 NOTE — Transfer of Care (Signed)
Immediate Anesthesia Transfer of Care Note  Patient: Joshua Medina  Procedure(s) Performed: ESOPHAGOGASTRODUODENOSCOPY (EGD) WITH PROPOFOL  Patient Location: PACU and Endoscopy Unit  Anesthesia Type:MAC  Level of Consciousness: sedated  Airway & Oxygen Therapy: Patient Spontanous Breathing  Post-op Assessment: Report given to RN and Post -op Vital signs reviewed and stable  Post vital signs: Reviewed and stable  Last Vitals:  Vitals Value Taken Time  BP 93/57 04/19/22 1646  Temp 36.7 C 04/19/22 1646  Pulse 92 04/19/22 1648  Resp 22 04/19/22 1648  SpO2 98 % 04/19/22 1648  Vitals shown include unvalidated device data.  Last Pain:  Vitals:   04/19/22 1646  TempSrc: Temporal  PainSc: 0-No pain         Complications: No notable events documented.

## 2022-04-19 NOTE — ED Notes (Signed)
Pt platelets have dropped, notified by lab. NP notified and orders put in for transfusion. Blood bank contacted and they said they would call to let nurse know when ready. This information will be passed a long to day time staff.

## 2022-04-20 ENCOUNTER — Encounter: Payer: Self-pay | Admitting: Gastroenterology

## 2022-04-20 DIAGNOSIS — K922 Gastrointestinal hemorrhage, unspecified: Secondary | ICD-10-CM | POA: Diagnosis not present

## 2022-04-20 LAB — CBC
HCT: 25 % — ABNORMAL LOW (ref 39.0–52.0)
Hemoglobin: 8.5 g/dL — ABNORMAL LOW (ref 13.0–17.0)
MCH: 32.2 pg (ref 26.0–34.0)
MCHC: 34 g/dL (ref 30.0–36.0)
MCV: 94.7 fL (ref 80.0–100.0)
Platelets: 69 10*3/uL — ABNORMAL LOW (ref 150–400)
RBC: 2.64 MIL/uL — ABNORMAL LOW (ref 4.22–5.81)
RDW: 16.5 % — ABNORMAL HIGH (ref 11.5–15.5)
WBC: 5.2 10*3/uL (ref 4.0–10.5)
nRBC: 0 % (ref 0.0–0.2)

## 2022-04-20 LAB — BASIC METABOLIC PANEL
Anion gap: 8 (ref 5–15)
BUN: 16 mg/dL (ref 6–20)
CO2: 31 mmol/L (ref 22–32)
Calcium: 8 mg/dL — ABNORMAL LOW (ref 8.9–10.3)
Chloride: 96 mmol/L — ABNORMAL LOW (ref 98–111)
Creatinine, Ser: 1.04 mg/dL (ref 0.61–1.24)
GFR, Estimated: >60 mL/min (ref >60)
Glucose, Bld: 108 mg/dL — ABNORMAL HIGH (ref 70–99)
Potassium: 3.2 mmol/L — ABNORMAL LOW (ref 3.5–5.1)
Sodium: 135 mmol/L (ref 135–145)

## 2022-04-20 LAB — PHOSPHORUS: Phosphorus: 2.4 mg/dL — ABNORMAL LOW (ref 2.5–4.6)

## 2022-04-20 LAB — MAGNESIUM: Magnesium: 1.2 mg/dL — ABNORMAL LOW (ref 1.7–2.4)

## 2022-04-20 MED ORDER — PANTOPRAZOLE SODIUM 40 MG PO TBEC
40.0000 mg | DELAYED_RELEASE_TABLET | Freq: Two times a day (BID) | ORAL | Status: DC
  Administered 2022-04-20 – 2022-04-21 (×3): 40 mg via ORAL
  Filled 2022-04-20 (×3): qty 1

## 2022-04-20 MED ORDER — POTASSIUM CHLORIDE CRYS ER 20 MEQ PO TBCR
40.0000 meq | EXTENDED_RELEASE_TABLET | Freq: Once | ORAL | Status: AC
  Administered 2022-04-20: 40 meq via ORAL
  Filled 2022-04-20: qty 2

## 2022-04-20 MED ORDER — K PHOS MONO-SOD PHOS DI & MONO 155-852-130 MG PO TABS
500.0000 mg | ORAL_TABLET | Freq: Once | ORAL | Status: AC
  Administered 2022-04-20: 500 mg via ORAL
  Filled 2022-04-20 (×2): qty 2

## 2022-04-20 MED ORDER — SODIUM PHOSPHATES 45 MMOLE/15ML IV SOLN: 15.0000 mmol | Freq: Once | INTRAVENOUS | Status: DC

## 2022-04-20 MED ORDER — NICOTINE 21 MG/24HR TD PT24
21.0000 mg | MEDICATED_PATCH | Freq: Every day | TRANSDERMAL | Status: DC
  Administered 2022-04-20 – 2022-04-21 (×2): 21 mg via TRANSDERMAL
  Filled 2022-04-20 (×2): qty 1

## 2022-04-20 MED ORDER — MAGNESIUM SULFATE 4 GM/100ML IV SOLN
4.0000 g | Freq: Once | INTRAVENOUS | Status: AC
  Administered 2022-04-20: 4 g via INTRAVENOUS
  Filled 2022-04-20: qty 100

## 2022-04-20 NOTE — TOC Progression Note (Signed)
Transition of Care [facility]) - Progression Note    Patient Details  Name: Joshua Medina MRN: [id number] Date of Birth: 1986/03/19  Transition of Care [facility]) CM/SW Contact  Gildardo Griffes, [place] Phone Number: 04/20/2022, 10:53 AM  Clinical Narrative:     CSW followed up with patient on living situation, transportation and meds. CSW notes yesterday patient told TOC he has a place to go. Today patietn reports he meant he would go back to the streets, patient reports he's familiar with the shelters in the area and can identify one to go to at discharge if he wishes. He uses the bus system for transportation and reports using [facility] on [address], requests meds be sent here at discharge. He reports no needs at this time.    Expected Discharge Plan: Home/Self Care Barriers to Discharge: Continued Medical Work up  Expected Discharge Plan and Services Expected Discharge Plan: Home/Self Care   Discharge Planning Services: CM Consult   Living arrangements for the past 2 months: Homeless                 DME Arranged: N/A DME Agency: NA       HH Arranged: NA HH Agency: NA         Social Determinants of Health (SDOH) Interventions    Readmission Risk Interventions    04/19/2022    3:44 PM  Readmission Risk Prevention Plan  Transportation Screening Complete  PCP or Specialist Appt within 3-5 Days Complete  HRI or Home Care Consult Not Complete  HRI or Home Care Consult comments NA  Social Work Consult for Recovery Care Planning/Counseling Complete  Palliative Care Screening Not Applicable  Medication Review Oceanographer) Complete

## 2022-04-20 NOTE — Progress Notes (Signed)
  PROGRESS NOTE    Joshua Medina  GQQ:[id number] DOB: July 16, 1986 DOA: 04/18/2022 PCP: Pcp, No  260A/260A-AA  LOS: 2 days   Brief hospital course: No notes on file  Assessment & Plan: Joshua Medina is a 36 y.o. male with medical history significant of alcohol abuse, chronic thrombocytopenia secondary to alcohol abuse, fatty liver, hypothyroidism, was found by bystander on the street when he was swallowing large amount of blood.   Patient was still under the influence of alcohol and somewhat confused, but he denies any abdominal pain chest pain or black tarry stool.  According to ED staff, patient was homeless and found sitting on the side of the street and with large amount of blood clot around him.  Patient does admit feeling lightheaded.  He drinks half a gallon to 1 gallon liquor every day last drink morning of presentation.   Hematemesis and acute blood loss anemia -EGD found 10 mm Mallory-Weiss tear and gastritis  --s/p 1u pRBC Plan: --cont PPI BID as oral   Acute on chronic thrombocytopenia --1u plt transfusion for plt of 28   Acute toxic encephalopathy -Secondary to alcohol intoxication -CIWA protocol  --thiamine and folic acid   Acute on chronic alcoholic hepatitis -Discriminant factor<32, no indication for steroid.   Hypothyroidism -Noncompliant with Synthroid --TSH 46, free T4 <0.25 --cont Synthroid 100 mcg  Hypokalemia Hypomag Hypophos --replete PRN   DVT prophylaxis: SCD/Compression stockings Code Status: Full code  Family Communication:  Level of care: Progressive Dispo:   The patient is from: homeless Anticipated d/c is to: shelter Anticipated d/c date is: tomorrow   Subjective and Interval History:  No epigastric pain, no more vomiting blood.  Ate well.   Objective: Vitals:   04/20/22 0414 04/20/22 0728 04/20/22 1127 04/20/22 1649  BP: (!) 96/57 92/64 (!) 90/57 94/72  Pulse: 75 78 83 69  Resp: 20 18 16 18   Temp: 98.3 F (36.8 C) 98.4 F (36.9  C) 98 F (36.7 C) 98 F (36.7 C)  TempSrc:  Oral    SpO2: 100% 100% 97% 100%  Weight:      Height:        Intake/Output Summary (Last 24 hours) at 04/20/2022 1842 Last data filed at 04/20/2022 0534 Gross per 24 hour  Intake --  Output 700 ml  Net -700 ml   Filed Weights   04/18/22 0913 04/19/22 1548  Weight: 70.3 kg 70.3 kg    Examination:   Constitutional: NAD, AAOx3 HEENT: conjunctivae and lids normal, EOMI CV: No cyanosis.   RESP: normal respiratory effort, on RA SKIN: warm, dry Neuro: II - XII grossly intact.   Psych: Normal mood and affect.     Data Reviewed: I have personally reviewed labs and imaging studies  Time spent: 35 minutes  04/21/22, MD [facility] If 7PM-7AM, please contact night-coverage 04/20/2022, 6:42 PM

## 2022-04-21 DIAGNOSIS — K922 Gastrointestinal hemorrhage, unspecified: Secondary | ICD-10-CM | POA: Diagnosis not present

## 2022-04-21 LAB — SURGICAL PATHOLOGY

## 2022-04-21 LAB — BASIC METABOLIC PANEL
Anion gap: 8 (ref 5–15)
BUN: 14 mg/dL (ref 6–20)
CO2: 28 mmol/L (ref 22–32)
Calcium: 8.7 mg/dL — ABNORMAL LOW (ref 8.9–10.3)
Chloride: 99 mmol/L (ref 98–111)
Creatinine, Ser: 0.96 mg/dL (ref 0.61–1.24)
GFR, Estimated: >60 mL/min (ref >60)
Glucose, Bld: 108 mg/dL — ABNORMAL HIGH (ref 70–99)
Potassium: 3 mmol/L — ABNORMAL LOW (ref 3.5–5.1)
Sodium: 135 mmol/L (ref 135–145)

## 2022-04-21 LAB — CBC
HCT: 26.5 % — ABNORMAL LOW (ref 39.0–52.0)
Hemoglobin: 9 g/dL — ABNORMAL LOW (ref 13.0–17.0)
MCH: 32 pg (ref 26.0–34.0)
MCHC: 34 g/dL (ref 30.0–36.0)
MCV: 94.3 fL (ref 80.0–100.0)
Platelets: 71 10*3/uL — ABNORMAL LOW (ref 150–400)
RBC: 2.81 MIL/uL — ABNORMAL LOW (ref 4.22–5.81)
RDW: 15.9 % — ABNORMAL HIGH (ref 11.5–15.5)
WBC: 4.8 10*3/uL (ref 4.0–10.5)
nRBC: 0 % (ref 0.0–0.2)

## 2022-04-21 LAB — PHOSPHORUS: Phosphorus: 3.8 mg/dL (ref 2.5–4.6)

## 2022-04-21 LAB — MAGNESIUM
Magnesium: 1.4 mg/dL — ABNORMAL LOW (ref 1.7–2.4)
Magnesium: 2.8 mg/dL — ABNORMAL HIGH (ref 1.7–2.4)

## 2022-04-21 LAB — POTASSIUM: Potassium: 3.3 mmol/L — ABNORMAL LOW (ref 3.5–5.1)

## 2022-04-21 MED ORDER — LEVOTHYROXINE SODIUM 100 MCG PO TABS: 100.0000 ug | ORAL_TABLET | Freq: Every day | ORAL | 0 refills | Status: DC

## 2022-04-21 MED ORDER — NICOTINE 21 MG/24HR TD PT24
21.0000 mg | MEDICATED_PATCH | Freq: Every day | TRANSDERMAL | 0 refills | Status: DC
Start: 2022-04-22 — End: 2022-10-01

## 2022-04-21 MED ORDER — FOLIC ACID 1 MG PO TABS: 1.0000 mg | ORAL_TABLET | Freq: Every day | ORAL | Status: DC

## 2022-04-21 MED ORDER — THIAMINE HCL 100 MG PO TABS: 100.0000 mg | ORAL_TABLET | Freq: Every day | ORAL | Status: DC

## 2022-04-21 MED ORDER — POTASSIUM CHLORIDE CRYS ER 20 MEQ PO TBCR
40.0000 meq | EXTENDED_RELEASE_TABLET | ORAL | Status: AC
  Administered 2022-04-21 (×2): 40 meq via ORAL
  Filled 2022-04-21 (×2): qty 2

## 2022-04-21 MED ORDER — MAGNESIUM SULFATE 4 GM/100ML IV SOLN
4.0000 g | Freq: Once | INTRAVENOUS | Status: AC
  Administered 2022-04-21: 4 g via INTRAVENOUS
  Filled 2022-04-21: qty 100

## 2022-04-21 MED ORDER — PANTOPRAZOLE SODIUM 40 MG PO TBEC: 40.0000 mg | DELAYED_RELEASE_TABLET | Freq: Two times a day (BID) | ORAL | 0 refills | Status: DC

## 2022-04-21 NOTE — Discharge Summary (Signed)
Physician Discharge Summary   Joshua Medina  male DOB: 08/25/1985  WJX:[id number]  PCP: Pcp, No  Admit date: 04/18/2022 Discharge date: 04/21/2022  Admitted From: homeless Disposition:  shelter CODE STATUS: Full code   Hospital Course:  For full details, please see H&P, progress notes, consult notes and ancillary notes.  Briefly,  Joshua Medina is a 36 y.o. male with medical history significant of alcohol abuse, chronic thrombocytopenia secondary to alcohol abuse, fatty liver, hypothyroidism, was found by bystander on the street when he was vomiting large amount of blood.   Patient was still under the influence of alcohol and somewhat confused on presentation.  According to ED staff, patient was homeless and found sitting on the side of the street and with large amount of blood clot around him.  He drinks half a gallon to 1 gallon liquor every day, last drink morning of presentation.   Hematemesis and acute blood loss anemia 2/2 Mallory-Weiss tear Alcoholic gastritis -EGD found 10 mm Mallory-Weiss tear and gastritis  --s/p 1u pRBC --discharged on 3 months of PPI BID, but may need to take indefinitely per GI --outpatient f/u with GI Dr. Allegra    Acute on chronic thrombocytopenia --1u plt transfusion for plt of 28.  Plt improved to 71 prior to discharge.   Acute toxic encephalopathy -Secondary to alcohol intoxication -monitored on CIWA protocol, no significant withdrawal. --thiamine and folic acid   Acute on chronic alcoholic hepatitis -Discriminant factor <32, no indication for steroid.   Hypothyroidism --Noncompliant with Synthroid --TSH 46, free T4 <0.25 --cont Synthroid 100 mcg   Hypokalemia Hypomag Hypophos --repleted PRN   Discharge Diagnoses:  Principal Problem:   GI bleed Active Problems:   Gastritis and gastroduodenitis   Mallory-Weiss tear   30 Day Unplanned Readmission Risk Score    Flowsheet Row ED to Hosp-Admission (Current) from 04/18/2022 in  [facility]  30 Day Unplanned Readmission Risk Score (%) 32.49 Filed at 04/21/2022 1200       This score is the patient's risk of an unplanned readmission within 30 days of being discharged (0 -100%). The score is based on dignosis, age, lab data, medications, orders, and past utilization.   Low:  0-14.9   Medium: 15-21.9   High: 22-29.9   Extreme: 30 and above         Discharge Instructions:  Allergies as of 04/21/2022       Reactions   Precedex [dexmedetomidine Hcl In Nacl] Other (See Comments)        Medication List     TAKE these medications    folic acid 1 MG tablet Commonly known as: FOLVITE Take 1 tablet (1 mg total) by mouth daily. Start taking on: April 22, 2022   levothyroxine 100 MCG tablet Commonly known as: Synthroid Take 1 tablet (100 mcg total) by mouth daily.   nicotine 21 mg/24hr patch Commonly known as: NICODERM CQ - dosed in mg/24 hours Place 1 patch (21 mg total) onto the skin daily. Start taking on: April 22, 2022   pantoprazole 40 MG tablet Commonly known as: Protonix Take 1 tablet (40 mg total) by mouth 2 (two) times daily.   thiamine 100 MG tablet Commonly known as: VITAMIN B1 Take 1 tablet (100 mg total) by mouth daily. Start taking on: April 22, 2022         Follow-up Information     Vanga, Loel Dubonnet, MD Follow up in 3 month(s).   Specialty: Gastroenterology Contact information: [address] [place] [address] [phone]                 Allergies  Allergen Reactions   Precedex [Dexmedetomidine Hcl In Nacl] Other (See Comments)     The results of significant diagnostics from this hospitalization (including imaging, microbiology, ancillary and laboratory) are listed below for reference.   Consultations:   Procedures/Studies: US Abdomen Limited RUQ (LIVER/GB)  Result Date: 04/18/2022 CLINICAL DATA:  Elevated LFTs. EXAM: ULTRASOUND ABDOMEN LIMITED RIGHT UPPER  QUADRANT COMPARISON:  CT scan 01/12/2021 FINDINGS: Gallbladder: No gallstones or gallbladder wall thickening. No pericholecystic fluid. The sonographer reports no sonographic Murphy's sign. Common bile duct: Diameter: 3 mm Liver: Heterogeneous coarsened parenchyma. No focal abnormality evident. Portal vein is patent on color Doppler imaging with normal direction of blood flow towards the liver. Other: None. IMPRESSION: Heterogeneous, coarsened hepatic parenchyma suggests geographic fatty deposition. No gallstones or biliary dilatation. Electronically Signed   By: Kennith Center M.D.   On: 04/18/2022 12:16   CT Head Wo Contrast  Result Date: 04/08/2022 CLINICAL DATA:  Found unresponsive. EXAM: CT HEAD WITHOUT CONTRAST TECHNIQUE: Contiguous axial images were obtained from the base of the skull through the vertex without intravenous contrast. RADIATION DOSE REDUCTION: This exam was performed according to the departmental dose-optimization program which includes automated exposure control, adjustment of the mA and/or kV according to patient size and/or use of iterative reconstruction technique. COMPARISON:  Jan 11, 2021 FINDINGS: Brain: There is mild cerebral atrophy with widening of the extra-axial spaces and ventricular dilatation. This is greater than expected for the patient's age and is stable in appearance when compared to the prior study. There is no evidence of acute infarction, hemorrhage, hydrocephalus, extra-axial collection or mass lesion/mass effect. Vascular: No hyperdense vessel or unexpected calcification. Skull: Normal. Negative for fracture or focal lesion. Sinuses/Orbits: Very mild left maxillary sinus mucosal thickening is seen. Other: None. IMPRESSION: 1. Stable exam without evidence of an acute intracranial abnormality. Electronically Signed   By: Aram Candela M.D.   On: 04/08/2022 01:00      Labs: BNP (last 3 results) No results for input(s): "BNP" in the last 8760 hours. Basic  Metabolic Panel: Recent Labs  Lab 04/15/22 1210 04/18/22 0913 04/19/22 0544 04/20/22 0614 04/21/22 0510  NA 141 138 137 135 135  K 3.4* 3.9 3.8 3.2* 3.0*  CL 98 92* 100 96* 99  CO2 27 28 29 31 28   GLUCOSE 116* 117* 98 108* 108*  BUN 17 26* 19 16 14   CREATININE 1.03 1.12 1.00 1.04 0.96  CALCIUM 8.9 9.3 7.2* 8.0* 8.7*  MG  --   --   --  1.2* 1.4*  PHOS  --   --   --  2.4* 3.8   Liver Function Tests: Recent Labs  Lab 04/15/22 1210 04/18/22 0913 04/19/22 0544  AST 184* 307* 127*  ALT 78* 128* 63*  ALKPHOS 112 124 71  BILITOT 1.5* 1.9* 1.3*  PROT 8.4* 7.9 5.3*  ALBUMIN 5.0 5.0 3.4*   Recent Labs  Lab 04/18/22 0913  LIPASE 24   No results for input(s): "AMMONIA" in the last 168 hours. CBC: Recent Labs  Lab 04/15/22 1210 04/18/22 0913 04/18/22 1722 04/18/22 2235 04/19/22 0544 04/19/22 1217 04/20/22 0614 04/21/22 0510  WBC 6.2 10.6*  --   --  5.4  --  5.2 4.8  NEUTROABS  --  6.7  --   --   --   --   --   --  HGB 15.9 12.0* 8.5* 8.8* 8.2*  --  8.5* 9.0*  HCT 47.0 36.1* 25.9* 25.8* 24.4*  --  25.0* 26.5*  MCV 95.1 98.1  --   --  96.1  --  94.7 94.3  PLT 61* 56*  --   --  28* 71* 69* 71*   Cardiac Enzymes: No results for input(s): "CKTOTAL", "CKMB", "CKMBINDEX", "TROPONINI" in the last 168 hours. BNP: Invalid input(s): "POCBNP" CBG: No results for input(s): "GLUCAP" in the last 168 hours. D-Dimer No results for input(s): "DDIMER" in the last 72 hours. Hgb A1c No results for input(s): "HGBA1C" in the last 72 hours. Lipid Profile No results for input(s): "CHOL", "HDL", "LDLCALC", "TRIG", "CHOLHDL", "LDLDIRECT" in the last 72 hours. Thyroid function studies No results for input(s): "TSH", "T4TOTAL", "T3FREE", "THYROIDAB" in the last 72 hours.  Invalid input(s): "FREET3" Anemia work up Recent Labs    04/18/22 2235  VITAMINB12 352  FOLATE 26.0  FERRITIN 347*  TIBC 246*  IRON 221*   Urinalysis    Component Value Date/Time   COLORURINE YELLOW (A)  11/15/2021 2157   APPEARANCEUR HAZY (A) 11/15/2021 2157   LABSPEC 1.017 11/15/2021 2157   PHURINE 6.0 11/15/2021 2157   GLUCOSEU NEGATIVE 11/15/2021 2157   HGBUR NEGATIVE 11/15/2021 2157   BILIRUBINUR NEGATIVE 11/15/2021 2157   KETONESUR NEGATIVE 11/15/2021 2157   PROTEINUR 100 (A) 11/15/2021 2157   NITRITE NEGATIVE 11/15/2021 2157   LEUKOCYTESUR NEGATIVE 11/15/2021 2157   Sepsis Labs Recent Labs  Lab 04/18/22 0913 04/19/22 0544 04/20/22 0614 04/21/22 0510  WBC 10.6* 5.4 5.2 4.8   Microbiology Recent Results (from the past 240 hour(s))  SARS Coronavirus 2 by RT PCR (hospital order, performed in [facility] hospital lab) *cepheid single result test* Anterior Nasal Swab     Status: None   Collection Time: 04/18/22  5:22 PM   Specimen: Anterior Nasal Swab  Result Value Ref Range Status   SARS Coronavirus 2 by RT PCR NEGATIVE NEGATIVE Final    Comment: (NOTE) SARS-CoV-2 target nucleic acids are NOT DETECTED.  The SARS-CoV-2 RNA is generally detectable in upper and lower respiratory specimens during the acute phase of infection. The lowest concentration of SARS-CoV-2 viral copies this assay can detect is 250 copies / mL. A negative result does not preclude SARS-CoV-2 infection and should not be used as the sole basis for treatment or other patient management decisions.  A negative result may occur with improper specimen collection / handling, submission of specimen other than nasopharyngeal swab, presence of viral mutation(s) within the areas targeted by this assay, and inadequate number of viral copies (<250 copies / mL). A negative result must be combined with clinical observations, patient history, and epidemiological information.  Fact Sheet for Patients:   RoadLapTop.co.za  Fact Sheet for Healthcare Providers: http://kim-miller.com/  This test is not yet approved or  cleared by the [place] FDA and has been authorized  for detection and/or diagnosis of SARS-CoV-2 by FDA under an Emergency Use Authorization (EUA).  This EUA will remain in effect (meaning this test can be used) for the duration of the COVID-19 declaration under Section 564(b)(1) of the Act, 21 U.S.C. section 360bbb-3(b)(1), unless the authorization is terminated or revoked sooner.  Performed at [facility], [address] [place], [place] [address]      Total time spend on discharging this patient, including the last patient exam, discussing the hospital stay, instructions for ongoing care as it relates to all pertinent caregivers, as well as  preparing the medical discharge records, prescriptions, and/or referrals as applicable, is 35 minutes.    Darlin Priestly, MD  [facility] 04/21/2022, 1:05 PM

## 2022-04-21 NOTE — Plan of Care (Signed)
  Problem: Education: Goal: Knowledge of General Education information will improve Description: Including pain rating scale, medication(s)/side effects and non-pharmacologic comfort measures 04/21/2022 1400 by Irish Lack, RN Outcome: Progressing 04/21/2022 1028 by Irish Lack, RN Outcome: Progressing   Problem: Health Behavior/Discharge Planning: Goal: Ability to manage health-related needs will improve 04/21/2022 1400 by Irish Lack, RN Outcome: Progressing 04/21/2022 1028 by Irish Lack, RN Outcome: Progressing   Problem: Clinical Measurements: Goal: Ability to maintain clinical measurements within normal limits will improve 04/21/2022 1400 by Irish Lack, RN Outcome: Progressing 04/21/2022 1028 by Irish Lack, RN Outcome: Progressing Goal: Will remain free from infection 04/21/2022 1400 by Irish Lack, RN Outcome: Progressing 04/21/2022 1028 by Irish Lack, RN Outcome: Progressing Goal: Diagnostic test results will improve 04/21/2022 1400 by Irish Lack, RN Outcome: Progressing 04/21/2022 1028 by Irish Lack, RN Outcome: Progressing Goal: Respiratory complications will improve 04/21/2022 1400 by Irish Lack, RN Outcome: Progressing 04/21/2022 1028 by Irish Lack, RN Outcome: Progressing Goal: Cardiovascular complication will be avoided 04/21/2022 1400 by Irish Lack, RN Outcome: Progressing 04/21/2022 1028 by Irish Lack, RN Outcome: Progressing   Problem: Activity: Goal: Risk for activity intolerance will decrease 04/21/2022 1400 by Irish Lack, RN Outcome: Progressing 04/21/2022 1028 by Irish Lack, RN Outcome: Progressing   Problem: Nutrition: Goal: Adequate nutrition will be maintained 04/21/2022 1400 by Irish Lack, RN Outcome: Progressing 04/21/2022 1028 by Irish Lack, RN Outcome: Progressing   Problem: Coping: Goal: Level of anxiety will decrease 04/21/2022  1400 by Irish Lack, RN Outcome: Progressing 04/21/2022 1028 by Irish Lack, RN Outcome: Progressing   Problem: Elimination: Goal: Will not experience complications related to bowel motility 04/21/2022 1400 by Irish Lack, RN Outcome: Progressing 04/21/2022 1028 by Irish Lack, RN Outcome: Progressing Goal: Will not experience complications related to urinary retention 04/21/2022 1400 by Irish Lack, RN Outcome: Progressing 04/21/2022 1028 by Irish Lack, RN Outcome: Progressing   Problem: Pain Managment: Goal: General experience of comfort will improve 04/21/2022 1400 by Irish Lack, RN Outcome: Progressing 04/21/2022 1028 by Irish Lack, RN Outcome: Progressing   Problem: Safety: Goal: Ability to remain free from injury will improve 04/21/2022 1400 by Irish Lack, RN Outcome: Progressing 04/21/2022 1028 by Irish Lack, RN Outcome: Progressing   Problem: Skin Integrity: Goal: Risk for impaired skin integrity will decrease 04/21/2022 1400 by Irish Lack, RN Outcome: Progressing 04/21/2022 1028 by Irish Lack, RN Outcome: Progressing

## 2022-04-21 NOTE — Progress Notes (Signed)
AVS given and explained to patient. 

## 2022-04-21 NOTE — Plan of Care (Signed)

## 2022-04-21 NOTE — Anesthesia Postprocedure Evaluation (Signed)
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  Procedure(s) Performed: ESOPHAGOGASTRODUODENOSCOPY (EGD) WITH PROPOFOL  Patient location during evaluation: Endoscopy Anesthesia Type: General Level of consciousness: awake and alert Pain management: pain level controlled Vital Signs Assessment: post-procedure vital signs reviewed and stable Respiratory status: spontaneous breathing, nonlabored ventilation, respiratory function stable and patient connected to nasal cannula oxygen Cardiovascular status: blood pressure returned to baseline and stable Postop Assessment: no apparent nausea or vomiting Anesthetic complications: no   No notable events documented.   Last Vitals:  Vitals:   04/21/22 0835 04/21/22 1148  BP: 90/61 (!) 91/54  Pulse: 75 75  Resp: 16 18  Temp: 36.8 C 36.8 C  SpO2: 100% 100%    Last Pain:  Vitals:   04/21/22 0938  TempSrc:   PainSc: 2                  Lenard Simmer

## 2022-04-26 LAB — BPAM PLATELET PHERESIS
Blood Product Expiration Date: 202308302359
ISSUE DATE / TIME: 202308300746
Unit Type and Rh: 5100

## 2022-04-26 LAB — PREPARE PLATELET PHERESIS: Unit division: 0

## 2022-05-12 ENCOUNTER — Emergency Department
Admission: EM | Admit: 2022-05-12 | Discharge: 2022-05-13 | Disposition: A | Discharge status: Home / Self Care | Payer: BLUE CROSS/BLUE SHIELD | Attending: Emergency Medicine | Admitting: Emergency Medicine

## 2022-05-12 ENCOUNTER — Emergency Department: Payer: BLUE CROSS/BLUE SHIELD

## 2022-05-12 DIAGNOSIS — S60012A Contusion of left thumb without damage to nail, initial encounter: Secondary | ICD-10-CM | POA: Insufficient documentation

## 2022-05-12 DIAGNOSIS — W1839XA Other fall on same level, initial encounter: Secondary | ICD-10-CM | POA: Insufficient documentation

## 2022-05-12 DIAGNOSIS — M79645 Pain in left finger(s): Secondary | ICD-10-CM | POA: Diagnosis present

## 2022-05-12 MED ORDER — ACETAMINOPHEN 500 MG PO TABS
1000.0000 mg | ORAL_TABLET | Freq: Once | ORAL | Status: AC
  Administered 2022-05-13: 1000 mg via ORAL
  Filled 2022-05-12: qty 2

## 2022-05-12 NOTE — ED Provider Notes (Signed)
[facility] Provider Note    Event Date/Time   First MD Initiated Contact with Patient 05/12/22 2303     (approximate)   History   Finger Injury   HPI  Joshua Medina is a 36 y.o. male who presents for evaluation of pain in his left thumb and his left hand proximal to the thumb.  He is well-known to the emergency department as this is his 10th visit within 6 months.  Most of his visits are related to alcohol use.  Tonight he reports that he fell down.  He is not sure of the circumstances but assumes it is because he drank some alcohol earlier.  He said that the only thing that hurts is the left thumb and hand.  There is no swelling or discoloration, no visible laceration.  He denies headache and neck pain, chest pain or shortness of breath.  Just prior to me seeing him he ambulated to the charge nurse desk and ask for "some fucking pain medicine, and give me some fucking food."  ***     Physical Exam   Triage Vital Signs: ED Triage Vitals  Enc Vitals Group     BP 05/12/22 2214 105/79     Pulse Rate 05/12/22 2214 69     Resp 05/12/22 2214 18     Temp 05/12/22 2214 98.4 F (36.9 C)     Temp Source 05/12/22 2214 Oral     SpO2 05/12/22 2214 100 %     Weight 05/12/22 2215 70.3 kg (155 lb)     Height 05/12/22 2215 1.778 m (5\' 10" )     Head Circumference --      Peak Flow --      Pain Score 05/12/22 2214 8     Pain Loc --      Pain Edu? --      Excl. in [place]? --     Most recent vital signs: Vitals:   05/12/22 2214  BP: 105/79  Pulse: 69  Resp: 18  Temp: 98.4 F (36.9 C)  SpO2: 100%     General: Awake, no distress.  Generally well-appearing, relatively clean and not disheveled. CV:  Good peripheral perfusion. *** Resp:  Normal effort. *** Abd:  No distention. *** Other:  ***   ED Results / Procedures / Treatments   Labs (all labs ordered are listed, but only abnormal results are displayed) Labs Reviewed - No data to  display   EKG  ***   RADIOLOGY *** {USE THE WORD "INTERPRETED"!! You MUST document your own interpretation of imaging, as well as the fact that you reviewed the radiologist's report!:1}   PROCEDURES:  Critical Care performed: {CriticalCareYesNo:19197::"Yes, see critical care procedure note(s)","No"}  Procedures   MEDICATIONS ORDERED IN ED: Medications  acetaminophen (TYLENOL) tablet 1,000 mg (has no administration in time range)     IMPRESSION / MDM / ASSESSMENT AND PLAN / ED COURSE  I reviewed the triage vital signs and the nursing notes.                              Differential diagnosis includes, but is not limited to, ***  Patient's presentation is most consistent with {EM COPA:27473}  {If the patient is on the monitor, remove the brackets and asterisks on the sentence below and remember to document it as a Procedure as well. Otherwise delete the sentence below:1} {**The patient is on the cardiac monitor to  evaluate for evidence of arrhythmia and/or significant heart rate changes.**} {Remember to include, when applicable, any/all of the following data: independent review of imaging independent review of labs (comment specifically on pertinent positives and negatives) review of specific prior hospitalizations, PCP/specialist notes, etc. discuss meds given and prescribed document any discussion with consultants (including hospitalists) any clinical decision tools you used and why (PECARN, NEXUS, etc.) did you consider admitting the patient? document social determinants of health affecting patient's care (homelessness, inability to follow up in a timely fashion, etc) document any pre-existing conditions increasing risk on current visit (e.g. diabetes and HTN increasing danger of high-risk chest pain/ACS) describes what meds you gave (especially parenteral) and why any other interventions?:1}     FINAL CLINICAL IMPRESSION(S) / ED DIAGNOSES   Final diagnoses:   Contusion of left thumb without damage to nail, initial encounter     Rx / DC Orders   ED Discharge Orders     None        Note:  This document was prepared using Dragon voice recognition software and may include unintentional dictation errors.

## 2022-05-12 NOTE — ED Notes (Addendum)
Pt c/o of right thumb "throbbing". Pt denies any injury to his right thumb. Pt states he wants food. Pt is not answering questions so RN can complete assessment.

## 2022-05-12 NOTE — ED Triage Notes (Signed)
Pt to ED via [facility]. Per EMS, pt fell and hurt left thumb. ETOH on board. Pt denies hitting head, no LOC. Denies CP, SOB

## 2022-05-12 NOTE — ED Notes (Signed)
First Nurse Note: Pt tripped and fell outside the PD - ETOH on board - Endorses left thumb pain following the fall. No LOC per EMS.   CBG96  66HR 100% RA 122/80

## 2022-05-14 ENCOUNTER — Encounter: Payer: Self-pay | Admitting: Emergency Medicine

## 2022-05-14 ENCOUNTER — Emergency Department
Admission: EM | Admit: 2022-05-14 | Discharge: 2022-05-14 | Disposition: A | Discharge status: Home / Self Care | Payer: BLUE CROSS/BLUE SHIELD | Attending: Emergency Medicine | Admitting: Emergency Medicine

## 2022-05-14 ENCOUNTER — Other Ambulatory Visit: Payer: Self-pay

## 2022-05-14 ENCOUNTER — Emergency Department: Payer: BLUE CROSS/BLUE SHIELD

## 2022-05-14 DIAGNOSIS — Y908 Blood alcohol level of 240 mg/100 ml or more: Secondary | ICD-10-CM | POA: Insufficient documentation

## 2022-05-14 DIAGNOSIS — F1010 Alcohol abuse, uncomplicated: Secondary | ICD-10-CM | POA: Insufficient documentation

## 2022-05-14 DIAGNOSIS — M79642 Pain in left hand: Secondary | ICD-10-CM | POA: Insufficient documentation

## 2022-05-14 DIAGNOSIS — E119 Type 2 diabetes mellitus without complications: Secondary | ICD-10-CM | POA: Diagnosis not present

## 2022-05-14 DIAGNOSIS — F10129 Alcohol abuse with intoxication, unspecified: Secondary | ICD-10-CM | POA: Diagnosis not present

## 2022-05-14 LAB — CBC
HCT: 42.7 % (ref 39.0–52.0)
Hemoglobin: 13.9 g/dL (ref 13.0–17.0)
MCH: 33.2 pg (ref 26.0–34.0)
MCHC: 32.6 g/dL (ref 30.0–36.0)
MCV: 101.9 fL — ABNORMAL HIGH (ref 80.0–100.0)
Platelets: 144 10*3/uL — ABNORMAL LOW (ref 150–400)
RBC: 4.19 MIL/uL — ABNORMAL LOW (ref 4.22–5.81)
RDW: 18.2 % — ABNORMAL HIGH (ref 11.5–15.5)
WBC: 10 10*3/uL (ref 4.0–10.5)
nRBC: 0 % (ref 0.0–0.2)

## 2022-05-14 LAB — COMPREHENSIVE METABOLIC PANEL
ALT: 67 U/L — ABNORMAL HIGH (ref 0–44)
AST: 72 U/L — ABNORMAL HIGH (ref 15–41)
Albumin: 5.2 g/dL — ABNORMAL HIGH (ref 3.5–5.0)
Alkaline Phosphatase: 145 U/L — ABNORMAL HIGH (ref 38–126)
Anion gap: 14 (ref 5–15)
BUN: 10 mg/dL (ref 6–20)
CO2: 27 mmol/L (ref 22–32)
Calcium: 10.2 mg/dL (ref 8.9–10.3)
Chloride: 103 mmol/L (ref 98–111)
Creatinine, Ser: 0.79 mg/dL (ref 0.61–1.24)
GFR, Estimated: >60 mL/min (ref >60)
Glucose, Bld: 109 mg/dL — ABNORMAL HIGH (ref 70–99)
Potassium: 4.1 mmol/L (ref 3.5–5.1)
Sodium: 144 mmol/L (ref 135–145)
Total Bilirubin: 0.6 mg/dL (ref 0.3–1.2)
Total Protein: 8.5 g/dL — ABNORMAL HIGH (ref 6.5–8.1)

## 2022-05-14 LAB — ETHANOL: Alcohol, Ethyl (B): 362 mg/dL (ref <10)

## 2022-05-14 NOTE — ED Notes (Signed)
Pt reports that he feels sobered up, given breakfast tray.

## 2022-05-14 NOTE — ED Triage Notes (Signed)
Pt reports he comes to ER for alcoholism. Pt reports he is here for DETOX. Pt drinks every day vodka about a gallon a day. Last time he drank was today about 3 hrs ago. Pt reports he has gone through withdrawals in the past when he does not drink in a period of 3 to 4 hrs. Pt talks in complete sentences no respiratory distress noted

## 2022-05-14 NOTE — ED Notes (Signed)
Pt ambulated to restroom this am. A&O x4. Reports is homeless and does not have a ride to leave ED. Dr. Ellender Hose informed.

## 2022-05-14 NOTE — ED Provider Notes (Signed)
[facility] Provider Note    None    (approximate)  History   Chief Complaint: Alcohol Problem  HPI  Joshua Medina is a 36 y.o. male with a past medical history of diabetes, alcoholism, presents to the emergency department for alcohol intoxication and states he needs to go to detox.  Patient was seen in the emergency department yesterday for alcohol intoxication as well.  Patient is back for alcohol intoxication.  Admits to drinking into the last several hours.  Patient is asking to go into detox.  I explained to the patient that we do not do in-house detox here any longer but I would be happy to provide outpatient resources for the patient.  As a secondary complaint he is having left hand pain from a fall yesterday per patient.  Physical Exam   Triage Vital Signs: ED Triage Vitals  Enc Vitals Group     BP 05/14/22 0030 99/79     Pulse Rate 05/14/22 0030 69     Resp 05/14/22 0030 16     Temp 05/14/22 0030 98.9 F (37.2 C)     Temp src --      SpO2 05/14/22 0030 100 %     Weight 05/14/22 0032 150 lb (68 kg)     Height 05/14/22 0032 5\' 10"  (1.778 m)     Head Circumference --      Peak Flow --      Pain Score 05/14/22 0031 0     Pain Loc --      Pain Edu? --      Excl. in [place]? --     Most recent vital signs: Vitals:   05/14/22 0030  BP: 99/79  Pulse: 69  Resp: 16  Temp: 98.9 F (37.2 C)  SpO2: 100%    General: Awake, no distress.  Appears intoxicated, keeps a blanket over his head however he does wake up and answer questions appropriately. CV:  Good peripheral perfusion.  Regular rate and rhythm  Resp:  Normal effort.  Equal breath sounds bilaterally.  Abd:  No distention.  Soft, nontender.  No rebound or guarding. Other:  Mild tenderness and swelling of the thenar eminence of the left hand with mild ecchymosis.   ED Results / Procedures / Treatments   MEDICATIONS ORDERED IN ED: Medications - No data to display   IMPRESSION / MDM /  [place] / ED COURSE  I reviewed the triage vital signs and the nursing notes.  Patient's presentation is most consistent with acute presentation with potential threat to life or bodily function.  Presents to the emergency department alcohol intoxication looking to go to detox.  I discussed with the patient we do not offer in-house alcohol detox but I would be happy to refer the patient to outpatient detox.  Patient agreeable to this plan.  Patient's lab work has resulted showing mild LFT elevation likely due to the patient's chronic alcoholism.  Alcohol level is elevated 362.  We will ensure the patient metabolizes somewhat in the emergency department prior to discharge with outpatient resources.  CBC shows no concerning findings.  Given the patient's left hand pain and a reported fall we will obtain an x-ray to rule out fracture.  Patient agreeable to plan of care.  X-rays negative for fracture.  Patient will be discharged once awake with outpatient resources.  FINAL CLINICAL IMPRESSION(S) / ED DIAGNOSES   Alcohol intoxication Alcohol abuse Left hand pain    Note:  This document was prepared using Dragon voice recognition software and may include unintentional dictation errors.   Minna Antis, MD 05/14/22 [phone]

## 2022-05-14 NOTE — ED Provider Notes (Signed)
Patient medically stable and clinically sober. Ambulating w/o difficulty. No SI, HI, AVH. D/c home with outpt resources.   Duffy Bruce, MD 05/14/22 [phone]

## 2022-05-14 NOTE — ED Triage Notes (Signed)
FIRST NURSE NOTE:  pt arrived via [facility] pt reports not feeling well hasn't had anything etoh in 6 hours, thinks he is going into withdrawal.  117/78 CBG 130 98% RA P-64

## 2022-05-14 NOTE — Discharge Instructions (Addendum)
To arrange detox at an outpatient facility.  Please abstain from drinking alcohol.  Return to the emergency department for any emergent condition concerning to yourself.

## 2022-05-20 ENCOUNTER — Other Ambulatory Visit: Payer: Self-pay

## 2022-05-20 ENCOUNTER — Emergency Department
Admission: EM | Admit: 2022-05-20 | Discharge: 2022-05-20 | Disposition: A | Discharge status: Home / Self Care | Payer: BLUE CROSS/BLUE SHIELD | Attending: Emergency Medicine | Admitting: Emergency Medicine

## 2022-05-20 DIAGNOSIS — E119 Type 2 diabetes mellitus without complications: Secondary | ICD-10-CM | POA: Diagnosis not present

## 2022-05-20 DIAGNOSIS — K2920 Alcoholic gastritis without bleeding: Secondary | ICD-10-CM | POA: Diagnosis not present

## 2022-05-20 DIAGNOSIS — Z20822 Contact with and (suspected) exposure to covid-19: Secondary | ICD-10-CM | POA: Insufficient documentation

## 2022-05-20 DIAGNOSIS — R112 Nausea with vomiting, unspecified: Secondary | ICD-10-CM | POA: Diagnosis present

## 2022-05-20 DIAGNOSIS — Y908 Blood alcohol level of 240 mg/100 ml or more: Secondary | ICD-10-CM | POA: Diagnosis not present

## 2022-05-20 LAB — RESP PANEL BY RT-PCR (FLU A&B, COVID) ARPGX2
Influenza A by PCR: NEGATIVE
Influenza B by PCR: NEGATIVE
SARS Coronavirus 2 by RT PCR: NEGATIVE

## 2022-05-20 LAB — COMPREHENSIVE METABOLIC PANEL
ALT: 39 U/L (ref 0–44)
AST: 68 U/L — ABNORMAL HIGH (ref 15–41)
Albumin: 4.7 g/dL (ref 3.5–5.0)
Alkaline Phosphatase: 142 U/L — ABNORMAL HIGH (ref 38–126)
Anion gap: 13 (ref 5–15)
BUN: 9 mg/dL (ref 6–20)
CO2: 29 mmol/L (ref 22–32)
Calcium: 8.9 mg/dL (ref 8.9–10.3)
Chloride: 102 mmol/L (ref 98–111)
Creatinine, Ser: 1.25 mg/dL — ABNORMAL HIGH (ref 0.61–1.24)
GFR, Estimated: >60 mL/min (ref >60)
Glucose, Bld: 168 mg/dL — ABNORMAL HIGH (ref 70–99)
Potassium: 3.1 mmol/L — ABNORMAL LOW (ref 3.5–5.1)
Sodium: 144 mmol/L (ref 135–145)
Total Bilirubin: 0.6 mg/dL (ref 0.3–1.2)
Total Protein: 7.5 g/dL (ref 6.5–8.1)

## 2022-05-20 LAB — SARS CORONAVIRUS 2 BY RT PCR: SARS Coronavirus 2 by RT PCR: NEGATIVE

## 2022-05-20 LAB — CBC
HCT: 39.8 % (ref 39.0–52.0)
Hemoglobin: 12.8 g/dL — ABNORMAL LOW (ref 13.0–17.0)
MCH: 32.7 pg (ref 26.0–34.0)
MCHC: 32.2 g/dL (ref 30.0–36.0)
MCV: 101.8 fL — ABNORMAL HIGH (ref 80.0–100.0)
Platelets: 108 10*3/uL — ABNORMAL LOW (ref 150–400)
RBC: 3.91 MIL/uL — ABNORMAL LOW (ref 4.22–5.81)
RDW: 17.8 % — ABNORMAL HIGH (ref 11.5–15.5)
WBC: 6.9 10*3/uL (ref 4.0–10.5)
nRBC: 0 % (ref 0.0–0.2)

## 2022-05-20 LAB — ETHANOL: Alcohol, Ethyl (B): 394 mg/dL (ref <10)

## 2022-05-20 LAB — URINE DRUG SCREEN, QUALITATIVE (ARMC ONLY)
Amphetamines, Ur Screen: NOT DETECTED
Barbiturates, Ur Screen: NOT DETECTED
Benzodiazepine, Ur Scrn: NOT DETECTED
Cannabinoid 50 Ng, Ur ~~LOC~~: NOT DETECTED
Cocaine Metabolite,Ur ~~LOC~~: NOT DETECTED
MDMA (Ecstasy)Ur Screen: NOT DETECTED
Methadone Scn, Ur: NOT DETECTED
Opiate, Ur Screen: NOT DETECTED
Phencyclidine (PCP) Ur S: NOT DETECTED
Tricyclic, Ur Screen: NOT DETECTED

## 2022-05-20 LAB — URINALYSIS, ROUTINE W REFLEX MICROSCOPIC
Bacteria, UA: NONE SEEN
Bilirubin Urine: NEGATIVE
Glucose, UA: NEGATIVE mg/dL
Hgb urine dipstick: NEGATIVE
Ketones, ur: NEGATIVE mg/dL
Leukocytes,Ua: NEGATIVE
Nitrite: NEGATIVE
Protein, ur: 30 mg/dL — AB
Specific Gravity, Urine: 1.011 (ref 1.005–1.030)
pH: 6 (ref 5.0–8.0)

## 2022-05-20 LAB — LIPASE, BLOOD: Lipase: 37 U/L (ref 11–51)

## 2022-05-20 MED ORDER — POTASSIUM CHLORIDE 20 MEQ PO PACK
40.0000 meq | PACK | Freq: Every day | ORAL | Status: DC
  Filled 2022-05-20: qty 2

## 2022-05-20 MED ORDER — ALUM & MAG HYDROXIDE-SIMETH 200-200-20 MG/5ML PO SUSP
15.0000 mL | Freq: Once | ORAL | Status: AC
  Administered 2022-05-20: 15 mL via ORAL
  Filled 2022-05-20: qty 30

## 2022-05-20 MED ORDER — FAMOTIDINE 20 MG PO TABS
20.0000 mg | ORAL_TABLET | Freq: Once | ORAL | Status: AC
  Administered 2022-05-20: 20 mg via ORAL
  Filled 2022-05-20: qty 1

## 2022-05-20 MED ORDER — POTASSIUM CHLORIDE CRYS ER 20 MEQ PO TBCR
40.0000 meq | EXTENDED_RELEASE_TABLET | Freq: Once | ORAL | Status: AC
  Administered 2022-05-20: 40 meq via ORAL
  Filled 2022-05-20: qty 2

## 2022-05-20 MED ORDER — ONDANSETRON 4 MG PO TBDP: 4.0000 mg | ORAL_TABLET | Freq: Four times a day (QID) | ORAL | 0 refills | Status: DC | PRN

## 2022-05-20 MED ORDER — ONDANSETRON 4 MG PO TBDP
4.0000 mg | ORAL_TABLET | Freq: Once | ORAL | Status: AC
  Administered 2022-05-20: 4 mg via ORAL
  Filled 2022-05-20: qty 1

## 2022-05-20 MED ORDER — ONDANSETRON HCL 4 MG/2ML IJ SOLN
4.0000 mg | INTRAMUSCULAR | Status: AC
  Administered 2022-05-20: 4 mg via INTRAVENOUS
  Filled 2022-05-20: qty 2

## 2022-05-20 MED ORDER — SODIUM CHLORIDE 0.9 % IV BOLUS
1000.0000 mL | Freq: Once | INTRAVENOUS | Status: AC
  Administered 2022-05-20: 1000 mL via INTRAVENOUS

## 2022-05-20 NOTE — ED Provider Notes (Signed)
[facility] Provider Note    Event Date/Time   First MD Initiated Contact with Patient 05/20/22 [phone]     (approximate)   History   Emesis   HPI  Joshua Medina is a 36 y.o. male with a history of diabetes and alcoholism with a couple of very recent visits to the ER.   Patient reports that he and his friend who are both homeless ran out of alcohol and his stomach feels nauseated because of it.  He denies being in any pain.  He reports this is happened many times it happens when he runs low on alcohol.  No fevers or chills.  No chest pain or diarrhea.  Denies use of any known store-bought alcohol  Reports at this point "you are asking to many questions" and reports he does not want to answer many more questions, but tells me he just wants something to help with his stomach and feeling nauseated.  Also reports that sometimes he gets dehydrated from use of alcohol  Physical Exam   Triage Vital Signs: ED Triage Vitals [05/20/22 0303]  Enc Vitals Group     BP 95/70     Pulse Rate 85     Resp 15     Temp 97.7 F (36.5 C)     Temp Source Oral     SpO2 99 %     Weight      Height      Head Circumference      Peak Flow      Pain Score 0     Pain Loc      Pain Edu?      Excl. in GC?     Most recent vital signs: Vitals:   05/20/22 1200 05/20/22 1300  BP: 114/85 115/85  Pulse: (!) 58 (!) 59  Resp: 12 11  Temp:    SpO2: 95% 94%     General: Sleeping comfortably with feet above his head.  He alerts to voice, reports that he does not want me to ask him to many questions but just wants something for nausea and feels dehydrated.  Normocephalic atraumatic. CV:  Good peripheral perfusion.  Normal tones Resp:  Normal effort.  Clear bilateral.  Speaks in full clear sentences Abd:  No distention.  Soft nontender nondistended through all quadrants.  No active emesis.  No emesis bag at this time bedside. Other:  Slightly somnolent, but alerts easily to  voice.  His speech is slightly    ED Results / Procedures / Treatments   Labs (all labs ordered are listed, but only abnormal results are displayed) Labs Reviewed  COMPREHENSIVE METABOLIC PANEL - Abnormal; Notable for the following components:      Result Value   Potassium 3.1 (*)    Glucose, Bld 168 (*)    Creatinine, Ser 1.25 (*)    AST 68 (*)    Alkaline Phosphatase 142 (*)    All other components within normal limits  CBC - Abnormal; Notable for the following components:   RBC 3.91 (*)    Hemoglobin 12.8 (*)    MCV 101.8 (*)    RDW 17.8 (*)    Platelets 108 (*)    All other components within normal limits  URINALYSIS, ROUTINE W REFLEX MICROSCOPIC - Abnormal; Notable for the following components:   Color, Urine YELLOW (*)    APPearance CLEAR (*)    Protein, ur 30 (*)    All other components within normal limits  ETHANOL - Abnormal; Notable for the following components:   Alcohol, Ethyl (B) 394 (*)    All other components within normal limits  SARS CORONAVIRUS 2 BY RT PCR  RESP PANEL BY RT-PCR (FLU A&B, COVID) ARPGX2  LIPASE, BLOOD  URINE DRUG SCREEN, QUALITATIVE ([facility] ONLY)    PROCEDURES:  Critical Care performed: No  Procedures   MEDICATIONS ORDERED IN ED: Medications  famotidine (PEPCID) tablet 20 mg (has no administration in time range)  ondansetron (ZOFRAN-ODT) disintegrating tablet 4 mg (has no administration in time range)  sodium chloride 0.9 % bolus 1,000 mL (0 mLs Intravenous Stopped 05/20/22 0913)  ondansetron (ZOFRAN) injection 4 mg (4 mg Intravenous Given 05/20/22 0810)  famotidine (PEPCID) tablet 20 mg (20 mg Oral Given 05/20/22 0808)  alum & mag hydroxide-simeth (MAALOX/MYLANTA) 200-200-20 MG/5ML suspension 15 mL (15 mLs Oral Given 05/20/22 0809)  potassium chloride SA (KLOR-CON M) CR tablet 40 mEq (40 mEq Oral Given 05/20/22 6948)     IMPRESSION / MDM / ASSESSMENT AND PLAN / ED COURSE  I reviewed the triage vital signs and the nursing notes.                               Differential diagnosis includes, but is not limited to, possible enteritis, gastritis, alcoholic gastritis, etc.  No evidence to support acute alcoholic ketoacidosis.  Labs reviewed, normal white count.  Hemoglobin mild anemia, but this is increased from his recent baselines over the last couple months.  No acute drop.  AST is elevated but again in keeping with chronic.  Mild elevation in his creatinine, GFR remains greater than 60  Patient's presentation is most consistent with acute complicated illness / injury requiring diagnostic workup.    Clinical Course as of 05/20/22 1411  Sat May 20, 2022  1020 Work-up to this point reassuring save for the patient's his severely elevated ethanol level.  At this juncture suspect the patient's nausea and fatigue are related to ethanol abuse and possibly some element of alcoholic gastritis though he has no active vomiting. [MQ]  1020 Patient reassessed, he is alert, reports feeling fatigued but overall improved.  Has received IV fluids.  At this juncture, will allow patient to further metabolize, as he is [MQ]  1021  intoxicated with slurred speech on arrival.  Once patient is clinically sober, plan to reassess but suspect likely able to discharge [MQ]  1334 Patient alert, fully oriented.  Ambulatory back and forth in the ER without difficulty.  Able to walk the course of the hallway and back without instability or fatigue.  Slurred speech has resolved.  He appears clinically sober at this time, and appropriate for discharge.  Patient does not have any money, and will give Zofran tablet prior to departure and send prescription for Zofran to medication assistance clinic. [MQ]  1334 Recommended patient consider going through detox due to his alcoholism, and recommended he contact [MQ]  1334  residential treatment services and [facility] Monday. [MQ]    Clinical Course User Index [MQ] Delman Kitten, MD   Medication assistance clinic  apparently permanently closed.  Will provide printed prescription for Zofran to patient    FINAL CLINICAL IMPRESSION(S) / ED DIAGNOSES   Final diagnoses:  Acute alcoholic gastritis without hemorrhage     Rx / DC Orders   ED Discharge Orders          Ordered    ondansetron (ZOFRAN-ODT) 4 MG disintegrating tablet  Every 6 hours PRN        05/20/22 1337             Note:  This document was prepared using Dragon voice recognition software and may include unintentional dictation errors.   Sharyn Creamer, MD 05/20/22 1436

## 2022-05-20 NOTE — ED Notes (Signed)
See triage note. Pt to ED from side of road by EMS with +ETOH on board. Pt complaint was N/V for 2 days.  Warm blanket and urinal provided to pt. Pt endorses vague abdominal pain when asked. See new orders.

## 2022-05-20 NOTE — ED Notes (Signed)
Walked down the hall without assistance or incident.

## 2022-05-20 NOTE — ED Notes (Addendum)
Patient is sleeping, easily awakened when this writer came into the room, but went back to sleep. Patient was reminded that a urine sample was needed. Urinal and call bell are at the bedside. Patient is sleeping with a blanket over his head.

## 2022-05-20 NOTE — ED Notes (Signed)
ED provider at bedside.

## 2022-05-20 NOTE — Discharge Instructions (Addendum)
No driving today or while using alcohol.   

## 2022-05-20 NOTE — ED Triage Notes (Signed)
N/V x2 days. Pt brought by EMS from side of road. (+) ETOH. Pt reports fatigue and decreased appetite as well. Denies SOB, fever, chills, pain.

## 2022-05-21 ENCOUNTER — Other Ambulatory Visit: Payer: Self-pay

## 2022-05-21 ENCOUNTER — Emergency Department
Admission: EM | Admit: 2022-05-21 | Discharge: 2022-05-22 | Disposition: A | Discharge status: Home / Self Care | Payer: BLUE CROSS/BLUE SHIELD | Attending: Emergency Medicine | Admitting: Emergency Medicine

## 2022-05-21 DIAGNOSIS — R45851 Suicidal ideations: Secondary | ICD-10-CM | POA: Diagnosis not present

## 2022-05-21 DIAGNOSIS — Y908 Blood alcohol level of 240 mg/100 ml or more: Secondary | ICD-10-CM | POA: Diagnosis not present

## 2022-05-21 DIAGNOSIS — F1010 Alcohol abuse, uncomplicated: Secondary | ICD-10-CM

## 2022-05-21 DIAGNOSIS — F1020 Alcohol dependence, uncomplicated: Secondary | ICD-10-CM | POA: Diagnosis present

## 2022-05-21 DIAGNOSIS — F17210 Nicotine dependence, cigarettes, uncomplicated: Secondary | ICD-10-CM | POA: Insufficient documentation

## 2022-05-21 DIAGNOSIS — E119 Type 2 diabetes mellitus without complications: Secondary | ICD-10-CM | POA: Insufficient documentation

## 2022-05-21 LAB — COMPREHENSIVE METABOLIC PANEL
ALT: 34 U/L (ref 0–44)
AST: 66 U/L — ABNORMAL HIGH (ref 15–41)
Albumin: 4.7 g/dL (ref 3.5–5.0)
Alkaline Phosphatase: 118 U/L (ref 38–126)
Anion gap: 14 (ref 5–15)
BUN: 9 mg/dL (ref 6–20)
CO2: 28 mmol/L (ref 22–32)
Calcium: 9.3 mg/dL (ref 8.9–10.3)
Chloride: 98 mmol/L (ref 98–111)
Creatinine, Ser: 1.12 mg/dL (ref 0.61–1.24)
GFR, Estimated: >60 mL/min (ref >60)
Glucose, Bld: 96 mg/dL (ref 70–99)
Potassium: 3.4 mmol/L — ABNORMAL LOW (ref 3.5–5.1)
Sodium: 140 mmol/L (ref 135–145)
Total Bilirubin: 0.8 mg/dL (ref 0.3–1.2)
Total Protein: 8.1 g/dL (ref 6.5–8.1)

## 2022-05-21 LAB — URINE DRUG SCREEN, QUALITATIVE (ARMC ONLY)
Amphetamines, Ur Screen: NOT DETECTED
Barbiturates, Ur Screen: NOT DETECTED
Benzodiazepine, Ur Scrn: NOT DETECTED
Cannabinoid 50 Ng, Ur ~~LOC~~: NOT DETECTED
Cocaine Metabolite,Ur ~~LOC~~: NOT DETECTED
MDMA (Ecstasy)Ur Screen: NOT DETECTED
Methadone Scn, Ur: NOT DETECTED
Opiate, Ur Screen: NOT DETECTED
Phencyclidine (PCP) Ur S: NOT DETECTED
Tricyclic, Ur Screen: NOT DETECTED

## 2022-05-21 LAB — CBC
HCT: 39.8 % (ref 39.0–52.0)
Hemoglobin: 12.9 g/dL — ABNORMAL LOW (ref 13.0–17.0)
MCH: 32.6 pg (ref 26.0–34.0)
MCHC: 32.4 g/dL (ref 30.0–36.0)
MCV: 100.5 fL — ABNORMAL HIGH (ref 80.0–100.0)
Platelets: 100 10*3/uL — ABNORMAL LOW (ref 150–400)
RBC: 3.96 MIL/uL — ABNORMAL LOW (ref 4.22–5.81)
RDW: 17.3 % — ABNORMAL HIGH (ref 11.5–15.5)
WBC: 6.3 10*3/uL (ref 4.0–10.5)
nRBC: 0 % (ref 0.0–0.2)

## 2022-05-21 LAB — ACETAMINOPHEN LEVEL: Acetaminophen (Tylenol), Serum: <10 ug/mL — ABNORMAL LOW (ref 10–30)

## 2022-05-21 LAB — SALICYLATE LEVEL: Salicylate Lvl: <7 mg/dL — ABNORMAL LOW (ref 7.0–30.0)

## 2022-05-21 LAB — ETHANOL: Alcohol, Ethyl (B): 297 mg/dL — ABNORMAL HIGH (ref <10)

## 2022-05-21 NOTE — ED Notes (Signed)
TTS speaking with patient. 

## 2022-05-21 NOTE — ED Notes (Signed)
Patient dressed out in burgundy scrubs. Items inventoried: One gray shirt, one pair of gray tennis shoes, one pair of black socks, one pair of jeans, loose coins, one lighter, one brown wallet.

## 2022-05-21 NOTE — ED Triage Notes (Signed)
Patient reports he drinks approximately one fifth of vodka daily for the past 20 years. States he called police for help. Reports heavy depression and states he no longer wants to live. Requesting help with ETOH detox. Denies suicidal plan. Calm, cooperative. Resp even, unlabored on RA.

## 2022-05-21 NOTE — ED Provider Notes (Signed)
[facility] Provider Note    Event Date/Time   First MD Initiated Contact with Patient 05/21/22 2256     (approximate)   History   Suicidal   HPI  Joshua Medina is a 36 y.o. male who presents to the ED for evaluation of Suicidal   I reviewed medical DC summary from 9/1 he was admitted for upper GI bleed associated with his longstanding alcoholism.  This is also his fifth ED visit in the past 10 days or so since then for alcohol-related complaints.  Patient presents to the ED for evaluation of "my drinking."  He reports considering killing himself by drinking too much as he is "tired of it."  Reports that he is "not thinking of taking a knife to my neck, thoughts which are thinking."  Physical Exam   Triage Vital Signs: ED Triage Vitals  Enc Vitals Group     BP 05/21/22 2152 115/85     Pulse Rate 05/21/22 2152 (!) 117     Resp 05/21/22 2152 20     Temp 05/21/22 2152 98 F (36.7 C)     Temp src --      SpO2 05/21/22 2152 100 %     Weight 05/21/22 2153 160 lb (72.6 kg)     Height 05/21/22 2153 5\' 10"  (1.778 m)     Head Circumference --      Peak Flow --      Pain Score 05/21/22 2152 0     Pain Loc --      Pain Edu? --      Excl. in GC? --     Most recent vital signs: Vitals:   05/21/22 2152  BP: 115/85  Pulse: (!) 117  Resp: 20  Temp: 98 F (36.7 C)  SpO2: 100%    General: Awake, no distress.  Clinically intoxicated. CV:  Good peripheral perfusion.  Resp:  Normal effort.  Abd:  No distention.  MSK:  No deformity noted.  Neuro:  No focal deficits appreciated. Other:     ED Results / Procedures / Treatments   Labs (all labs ordered are listed, but only abnormal results are displayed) Labs Reviewed  COMPREHENSIVE METABOLIC PANEL - Abnormal; Notable for the following components:      Result Value   Potassium 3.4 (*)    AST 66 (*)    All other components within normal limits  ETHANOL - Abnormal; Notable for the following  components:   Alcohol, Ethyl (B) 297 (*)    All other components within normal limits  SALICYLATE LEVEL - Abnormal; Notable for the following components:   Salicylate Lvl <7.0 (*)    All other components within normal limits  ACETAMINOPHEN LEVEL - Abnormal; Notable for the following components:   Acetaminophen (Tylenol), Serum <10 (*)    All other components within normal limits  CBC - Abnormal; Notable for the following components:   RBC 3.96 (*)    Hemoglobin 12.9 (*)    MCV 100.5 (*)    RDW 17.3 (*)    Platelets 100 (*)    All other components within normal limits  RESP PANEL BY RT-PCR (FLU A&B, COVID) ARPGX2  URINE DRUG SCREEN, QUALITATIVE ([facility] ONLY)    EKG   RADIOLOGY   Official radiology report(s): No results found.  PROCEDURES and INTERVENTIONS:  Procedures  Medications - No data to display   IMPRESSION / MDM / ASSESSMENT AND PLAN / ED COURSE  I reviewed the triage vital  signs and the nursing notes.  Differential diagnosis includes, but is not limited to, malingering, polysubstance abuse, suicidal, overdose, GI bleed  {Patient presents with symptoms of an acute illness or injury that is potentially life-threatening.  36 year old alcoholic presents to the ED with vague SI in the setting of intoxication requiring psychiatric evaluation and sobering up.  He has no discrete plans formulated right now.  No signs of coingestions or particular toxidromes beyond ethanol intoxication.  Stable hemoglobin without signs or symptoms of GI bleeding.  No significant metabolic derangements and negative UDS.  We will consult psychiatry and hold him here voluntarily for now.  Clinical Course as of 05/22/22 0026  Mon May 22, 2022  0024 The patient has been placed in psychiatric observation due to the need to provide a safe environment for the patient while obtaining psychiatric consultation and evaluation, as well as ongoing medical and medication management to treat the patient's  condition.  The patient has not been placed under full IVC at this time.   [DS]    Clinical Course User Index [DS] Vladimir Crofts, MD     FINAL CLINICAL IMPRESSION(S) / ED DIAGNOSES   Final diagnoses:  Suicidal thoughts  Alcohol abuse     Rx / DC Orders   ED Discharge Orders     None        Note:  This document was prepared using Dragon voice recognition software and may include unintentional dictation errors.   Vladimir Crofts, MD 05/22/22 [phone]

## 2022-05-21 NOTE — ED Notes (Signed)
Patient states he is here for alcohol abuse with si and would like detox.  Reports last alcohol intake was approximately 2 hours prior to arrival.  States usually drinks between a pint and 1/2 gallon daily.

## 2022-05-22 DIAGNOSIS — F1020 Alcohol dependence, uncomplicated: Secondary | ICD-10-CM | POA: Diagnosis not present
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
## 2022-05-22 NOTE — ED Notes (Signed)
Patient given breakfast tray.

## 2022-05-22 NOTE — ED Notes (Signed)
VOL  CONSULT  DONE  PENDING  PLACEMENT 

## 2022-05-22 NOTE — ED Notes (Signed)
E-signature not working at this time. Pt verbalized understanding of D/C instructions, prescriptions and follow up care with no further questions at this time. Pt in NAD and ambulatory at time of D/C.  

## 2022-05-22 NOTE — ED Provider Notes (Signed)
Patient was evaluated by psychiatry.  Did not feel that the patient met inpatient criteria for hospitalization.  Recommended outpatient follow-up for detox.  Given resources in the area.  Given return precautions.   Nathaniel Man, MD 05/22/22 1240

## 2022-05-22 NOTE — Discharge Instructions (Addendum)
Treatment centers you can contact: [facility] [phone] [facility] [phone] [facility] [phone] [phone]

## 2022-05-22 NOTE — Consult Note (Signed)
[facility] Face-to-Face Psychiatry Consult   Reason for Consult: Alcohol use disorder Referring Physician:  EDP Patient Identification: Joshua Medina MRN:  [id number] Principal Diagnosis: Alcohol use disorder, moderate, dependence (HCC) Diagnosis:  Principal Problem:   Alcohol use disorder, moderate, dependence (HCC)   Total Time spent with patient: 30 minutes  Subjective:   Joshua Medina is a 36 y.o. male patient admitted with alcohol intoxication.  HPI:  BAL is 297 on 10/1 at 2200.  Patient comes to ED frequently for alcohol intoxication.  Patient presents to ED last evening stating that he has "heavy depression" and is requesting help with EtOH detox.  Denies suicidal plan.  On evaluation, patient is alert and oriented x 3.  He clearly states that he is not suicidal and he wants help with his physical symptoms of detox.  He states that he is willing/ wishes to go to a rehab program.  Patient denies homicidal ideation, paranoia, auditory or visual hallucinations.  He is speaking in clear, coherent sentences and his appeared to metabolize alcohol.  He is requesting resources for outpatient/inpatient services.  For alcohol use disorder.  Patient states that he is homeless  Past Psychiatric History: Alcohol use disorder.  No prior psychiatric hospitalizations.  No treatment for depression.  Risk to Self:   Risk to Others:   Prior Inpatient Therapy:   Prior Outpatient Therapy:    Past Medical History:  Past Medical History:  Diagnosis Date   Diabetes mellitus without complication (HCC)    Graves disease    Thyroid disease     Past Surgical History:  Procedure Laterality Date   ESOPHAGOGASTRODUODENOSCOPY (EGD) WITH PROPOFOL N/A 04/19/2022   Procedure: ESOPHAGOGASTRODUODENOSCOPY (EGD) WITH PROPOFOL;  Surgeon: Toney Reil, MD;  Location: [facility] ENDOSCOPY;  Service: Gastroenterology;  Laterality: N/A;   thyroid ablation with radioactive I-131     Family History:  Family History   Problem Relation Age of Onset   Diabetes Paternal Grandmother    Family Psychiatric  History:  Social History:  Social History   Substance and Sexual Activity  Alcohol Use Yes   Comment: fifth of liquor daily     Social History   Substance and Sexual Activity  Drug Use Never    Social History   Socioeconomic History   Marital status: Single    Spouse name: Not on file   Number of children: Not on file   Years of education: Not on file   Highest education level: Not on file  Occupational History   Not on file  Tobacco Use   Smoking status: Every Day    Types: Cigarettes   Smokeless tobacco: Never  Vaping Use   Vaping Use: Some days  Substance and Sexual Activity   Alcohol use: Yes    Comment: fifth of liquor daily   Drug use: Never   Sexual activity: Never  Other Topics Concern   Not on file  Social History Narrative   Not on file   Social Determinants of Health   Financial Resource Strain: Not on file  Food Insecurity: Not on file  Transportation Needs: Not on file  Physical Activity: Not on file  Stress: Not on file  Social Connections: Not on file   Additional Social History:    Allergies:   Allergies  Allergen Reactions   Precedex [Dexmedetomidine Hcl In Nacl] Other (See Comments)    Labs:  Results for orders placed or performed during the hospital encounter of 05/21/22 (from the past 48 hour(s))  Comprehensive  metabolic panel     Status: Abnormal   Collection Time: 05/21/22 10:02 PM  Result Value Ref Range   Sodium 140 135 - 145 mmol/L   Potassium 3.4 (L) 3.5 - 5.1 mmol/L   Chloride 98 98 - 111 mmol/L   CO2 28 22 - 32 mmol/L   Glucose, Bld 96 70 - 99 mg/dL    Comment: Glucose reference range applies only to samples taken after fasting for at least 8 hours.   BUN 9 6 - 20 mg/dL   Creatinine, Ser 1.12 0.61 - 1.24 mg/dL   Calcium 9.3 8.9 - 10.3 mg/dL   Total Protein 8.1 6.5 - 8.1 g/dL   Albumin 4.7 3.5 - 5.0 g/dL   AST 66 (H) 15 - 41 U/L    ALT 34 0 - 44 U/L   Alkaline Phosphatase 118 38 - 126 U/L   Total Bilirubin 0.8 0.3 - 1.2 mg/dL   GFR, Estimated >60 >60 mL/min    Comment: (NOTE) Calculated using the CKD-EPI Creatinine Equation (2021)    Anion gap 14 5 - 15    Comment: Performed at [facility], [address] [place], [place] [address]  Ethanol     Status: Abnormal   Collection Time: 05/21/22 10:02 PM  Result Value Ref Range   Alcohol, Ethyl (B) 297 (H) <10 mg/dL    Comment: (NOTE) Lowest detectable limit for serum alcohol is 10 mg/dL.  For medical purposes only. Performed at [facility], [address] [place], [place] [address]   Salicylate level     Status: Abnormal   Collection Time: 05/21/22 10:02 PM  Result Value Ref Range   Salicylate Lvl <5.8 (L) 7.0 - 30.0 mg/dL    Comment: Performed at [facility], [address] [place], [place] [address]  Acetaminophen level     Status: Abnormal   Collection Time: 05/21/22 10:02 PM  Result Value Ref Range   Acetaminophen (Tylenol), Serum <10 (L) 10 - 30 ug/mL    Comment: (NOTE) Therapeutic concentrations vary significantly. A range of 10-30 ug/mL  may be an effective concentration for many patients. However, some  are best treated at concentrations outside of this range. Acetaminophen concentrations >150 ug/mL at 4 hours after ingestion  and >50 ug/mL at 12 hours after ingestion are often associated with  toxic reactions.  Performed at [facility], [address] [place], [place] [address]   cbc     Status: Abnormal   Collection Time: 05/21/22 10:02 PM  Result Value Ref Range   WBC 6.3 4.0 - 10.5 K/uL   RBC 3.96 (L) 4.22 - 5.81 MIL/uL   Hemoglobin 12.9 (L) 13.0 - 17.0 g/dL   HCT 39.8 39.0 - 52.0 %   MCV 100.5 (H) 80.0 - 100.0 fL   MCH 32.6 26.0 - 34.0 pg   MCHC 32.4 30.0 - 36.0 g/dL   RDW 17.3 (H) 11.5 - 15.5 %   Platelets 100 (L) 150 - 400 K/uL   nRBC 0.0 0.0 - 0.2 %    Comment: Performed at  [facility], [address] [place], [place] [address]  Urine Drug Screen, Qualitative     Status: None   Collection Time: 05/21/22 10:02 PM  Result Value Ref Range   Tricyclic, Ur Screen NONE DETECTED NONE DETECTED   Amphetamines, Ur Screen NONE DETECTED NONE DETECTED   MDMA (Ecstasy)Ur Screen NONE DETECTED NONE DETECTED   Cocaine Metabolite,Ur [place] NONE DETECTED NONE DETECTED  Opiate, Ur Screen NONE DETECTED NONE DETECTED   Phencyclidine (PCP) Ur S NONE DETECTED NONE DETECTED   Cannabinoid 50 Ng, Ur [place] NONE DETECTED NONE DETECTED   Barbiturates, Ur Screen NONE DETECTED NONE DETECTED   Benzodiazepine, Ur Scrn NONE DETECTED NONE DETECTED   Methadone Scn, Ur NONE DETECTED NONE DETECTED    Comment: (NOTE) Tricyclics + metabolites, urine    Cutoff 1000 ng/mL Amphetamines + metabolites, urine  Cutoff 1000 ng/mL MDMA (Ecstasy), urine              Cutoff 500 ng/mL Cocaine Metabolite, urine          Cutoff 300 ng/mL Opiate + metabolites, urine        Cutoff 300 ng/mL Phencyclidine (PCP), urine         Cutoff 25 ng/mL Cannabinoid, urine                 Cutoff 50 ng/mL Barbiturates + metabolites, urine  Cutoff 200 ng/mL Benzodiazepine, urine              Cutoff 200 ng/mL Methadone, urine                   Cutoff 300 ng/mL  The urine drug screen provides only a preliminary, unconfirmed analytical test result and should not be used for non-medical purposes. Clinical consideration and professional judgment should be applied to any positive drug screen result due to possible interfering substances. A more specific alternate chemical method must be used in order to obtain a confirmed analytical result. Gas chromatography / mass spectrometry (GC/MS) is the preferred confirm atory method. Performed at [facility], [address] [place], [place] [address]     No current facility-administered medications for this encounter.   Current Outpatient Medications  Medication  Sig Dispense Refill   folic acid (FOLVITE) 1 MG tablet Take 1 tablet (1 mg total) by mouth daily. (Patient not taking: Reported on 05/14/2022)     levothyroxine (SYNTHROID) 100 MCG tablet Take 1 tablet (100 mcg total) by mouth daily. (Patient not taking: Reported on 05/14/2022) 90 tablet 0   nicotine (NICODERM CQ - DOSED IN MG/24 HOURS) 21 mg/24hr patch Place 1 patch (21 mg total) onto the skin daily. (Patient not taking: Reported on 05/14/2022) 28 patch 0   ondansetron (ZOFRAN-ODT) 4 MG disintegrating tablet Take 1 tablet (4 mg total) by mouth every 6 (six) hours as needed for nausea or vomiting. (Patient not taking: Reported on 05/22/2022) 20 tablet 0   pantoprazole (PROTONIX) 40 MG tablet Take 1 tablet (40 mg total) by mouth 2 (two) times daily. (Patient not taking: Reported on 05/14/2022) 180 tablet 0   thiamine (VITAMIN B1) 100 MG tablet Take 1 tablet (100 mg total) by mouth daily. (Patient not taking: Reported on 05/14/2022)      Musculoskeletal: Strength & Muscle Tone: within normal limits Gait & Station:  Did not observe Patient leans: N/A   Psychiatric Specialty Exam:  Presentation  General Appearance: Casual  Eye Contact:Good  Speech:Clear and Coherent  Speech Volume:Normal  Handedness:No data recorded  Mood and Affect  Mood:Depressed  Affect:Congruent   Thought Process  Thought Processes:Goal Directed  Descriptions of Associations:Intact  Orientation:Full (Time, Place and Person)  Thought Content:Logical; WDL  History of Schizophrenia/Schizoaffective disorder:No  Duration of Psychotic Symptoms:No data recorded Hallucinations:Hallucinations: None  Ideas of Reference:None  Suicidal Thoughts:Suicidal Thoughts: No  Homicidal Thoughts:Homicidal Thoughts: No   Sensorium  Memory:No data recorded Judgment:Fair  Insight:Poor   Executive  Functions  Concentration:Fair  Attention Span:Fair  Recall:Fair  Fund of  Knowledge:Fair  Language:Fair   Psychomotor Activity  Psychomotor Activity:Psychomotor Activity: Normal   Assets  Assets:Resilience; Physical Health; Desire for Improvement; Financial Resources/Insurance   Sleep  Sleep:Sleep: Fair   Physical Exam: Physical Exam Vitals and nursing note reviewed.  HENT:     Head: Normocephalic.     Nose: No congestion or rhinorrhea.  Cardiovascular:     Rate and Rhythm: Normal rate.  Pulmonary:     Effort: Pulmonary effort is normal.  Musculoskeletal:        General: Normal range of motion.  Skin:    General: Skin is dry.  Neurological:     Mental Status: He is alert and oriented to person, place, and time.    Review of Systems  Constitutional:  Positive for malaise/fatigue.  Eyes: Negative.   Respiratory: Negative.    Musculoskeletal:  Positive for myalgias.  Psychiatric/Behavioral:  Positive for depression (stable) and substance abuse. Negative for hallucinations, memory loss and suicidal ideas. The patient is not nervous/anxious and does not have insomnia.    Blood pressure 111/69, pulse 85, temperature 98.2 F (36.8 C), temperature source Oral, resp. rate 16, height 5\' 10"  (1.778 m), weight 72.6 kg, SpO2 98 %. Body mass index is 22.96 kg/m.  Treatment Plan Summary: Medication management : On CIWA. States that he is having cramping, shaking. Reviewed with EDP  Disposition: Patient does not meet criteria for psychiatric inpatient admission. Supportive therapy provided about ongoing stressors. Discussed crisis plan, support from social network, calling 911, coming to the Emergency Department, and calling Suicide Hotline.  , NP 05/22/2022 12:15 PM

## 2022-05-24 ENCOUNTER — Emergency Department
Admission: EM | Admit: 2022-05-24 | Discharge: 2022-05-25 | Disposition: A | Discharge status: Home / Self Care | Payer: BLUE CROSS/BLUE SHIELD | Attending: Emergency Medicine | Admitting: Emergency Medicine

## 2022-05-24 DIAGNOSIS — F10129 Alcohol abuse with intoxication, unspecified: Secondary | ICD-10-CM | POA: Insufficient documentation

## 2022-05-24 DIAGNOSIS — Y908 Blood alcohol level of 240 mg/100 ml or more: Secondary | ICD-10-CM | POA: Insufficient documentation

## 2022-05-24 DIAGNOSIS — E876 Hypokalemia: Secondary | ICD-10-CM | POA: Diagnosis not present

## 2022-05-24 DIAGNOSIS — E119 Type 2 diabetes mellitus without complications: Secondary | ICD-10-CM | POA: Diagnosis not present

## 2022-05-24 DIAGNOSIS — Z5900 Homelessness unspecified: Secondary | ICD-10-CM | POA: Insufficient documentation

## 2022-05-24 DIAGNOSIS — F10920 Alcohol use, unspecified with intoxication, uncomplicated: Secondary | ICD-10-CM

## 2022-05-24 MED ORDER — THIAMINE HCL 100 MG PO TABS
100.0000 mg | ORAL_TABLET | Freq: Every day | ORAL | Status: DC
  Filled 2022-05-24: qty 1

## 2022-05-24 MED ORDER — THIAMINE HCL 100 MG/ML IJ SOLN
Freq: Once | INTRAVENOUS | Status: AC
  Filled 2022-05-24: qty 1000

## 2022-05-24 MED ORDER — LORAZEPAM 2 MG/ML IJ SOLN
0.0000 mg | Freq: Four times a day (QID) | INTRAMUSCULAR | Status: DC
  Administered 2022-05-24: 2 mg via INTRAVENOUS
  Filled 2022-05-24: qty 1

## 2022-05-24 MED ORDER — SODIUM CHLORIDE 0.9 % IV BOLUS
1000.0000 mL | Freq: Once | INTRAVENOUS | Status: AC
  Administered 2022-05-24: 1000 mL via INTRAVENOUS

## 2022-05-24 NOTE — ED Notes (Signed)
Pt had gatorade bottle with person, removed and at nurses station during care as it is unclear what may be inside

## 2022-05-24 NOTE — ED Notes (Signed)
Pt given sandwich tray and drink. 

## 2022-05-24 NOTE — ED Triage Notes (Signed)
Pt arrives to ED via [facility] from side of street where 911 was called due to person stumbling while they walked. EMS reports on arrival that pt reported being homeless and wanted to go to homeless shelter but he would not be allowed in. Pt then requested to go to ED due to intoxication. On arrival, pt is unsteady on feet, reports intoxication of unknown amount, chronic drinking and drink of choice is vodka.

## 2022-05-24 NOTE — ED Provider Notes (Signed)
[facility] Provider Note    Event Date/Time   First MD Initiated Contact with Patient 05/24/22 2319     (approximate)   History   Alcohol Intoxication and Homeless   HPI  Level V caveat: Limited by intoxication  Joshua Medina is a 36 y.o. male brought to the ED via EMS from street side with a chief complaint of intoxication and homelessness.  Patient is well-known to the emergency department for history of same.  Denies fall, striking head or LOC.  Denies chest pain, shortness of breath, abdominal pain, nausea, vomiting or dizziness.  Denies SI/HI/AH/VH.     Past Medical History   Past Medical History:  Diagnosis Date  . Diabetes mellitus without complication ([place])   . Graves disease   . Thyroid disease      Active Problem List   Patient Active Problem List   Diagnosis Date Noted  . Gastritis and gastroduodenitis   . Mallory-Weiss tear   . GI bleed 04/18/2022  . Hematemesis with nausea   . UGIB (upper gastrointestinal bleed)   . Altered mental status 01/11/2021  . Hyponatremia 01/11/2021  . Hypotension 06/10/2020  . Hypoglycemia without diagnosis of diabetes mellitus 06/10/2020  . Alcohol withdrawal seizure ([place]) 06/04/2020  . Malnutrition of moderate degree 02/17/2020  . Hypomagnesemia 02/16/2020  . Delirium tremens ([place]) 02/16/2020  . Alcohol use disorder, moderate, dependence ([place]) 02/15/2020  . Hypothyroidism (acquired) 02/15/2020  . Alcoholic liver disease ([place]) 02/15/2020  . Alcohol withdrawal seizure with complication, with delirium ([place]) 02/15/2020  . Alcohol withdrawal seizure, with delirium ([place]) 02/15/2020  . Thrombocytopenia ([place]) 02/15/2020  . Anemia 02/15/2020  . Hypokalemia 02/15/2020     Past Surgical History   Past Surgical History:  Procedure Laterality Date  . ESOPHAGOGASTRODUODENOSCOPY (EGD) WITH PROPOFOL N/A 04/19/2022   Procedure: ESOPHAGOGASTRODUODENOSCOPY (EGD) WITH PROPOFOL;  Surgeon: Lin Landsman, MD;  Location: [facility];  Service: Gastroenterology;  Laterality: N/A;  . thyroid ablation with radioactive I-131       Home Medications   Prior to Admission medications   Medication Sig Start Date End Date Taking? Authorizing Provider  folic acid (FOLVITE) 1 MG tablet Take 1 tablet (1 mg total) by mouth daily. Patient not taking: Reported on 05/14/2022 04/22/22   Enzo Bi, MD  levothyroxine (SYNTHROID) 100 MCG tablet Take 1 tablet (100 mcg total) by mouth daily. Patient not taking: Reported on 05/14/2022 04/21/22 07/20/22  Enzo Bi, MD  nicotine (NICODERM CQ - DOSED IN MG/24 HOURS) 21 mg/24hr patch Place 1 patch (21 mg total) onto the skin daily. Patient not taking: Reported on 05/14/2022 04/22/22   Enzo Bi, MD  ondansetron (ZOFRAN-ODT) 4 MG disintegrating tablet Take 1 tablet (4 mg total) by mouth every 6 (six) hours as needed for nausea or vomiting. Patient not taking: Reported on 05/22/2022 05/20/22   Delman Kitten, MD  pantoprazole (PROTONIX) 40 MG tablet Take 1 tablet (40 mg total) by mouth 2 (two) times daily. Patient not taking: Reported on 05/14/2022 04/21/22 07/20/22  Enzo Bi, MD  thiamine (VITAMIN B1) 100 MG tablet Take 1 tablet (100 mg total) by mouth daily. Patient not taking: Reported on 05/14/2022 04/22/22   Enzo Bi, MD     Allergies  Precedex [dexmedetomidine hcl in nacl]   Family History   Family History  Problem Relation Age of Onset  . Diabetes Paternal Grandmother      Physical Exam  Triage Vital Signs: ED Triage Vitals [05/24/22 2319]  Enc Vitals  Group     BP      Pulse      Resp      Temp      Temp src      SpO2      Weight 170 lb (77.1 kg)     Height 5\' 10"  (1.778 m)     Head Circumference      Peak Flow      Pain Score 0     Pain Loc      Pain Edu?      Excl. in GC?     Updated Vital Signs: BP 106/72 (BP Location: Right Arm)   Pulse 83   Temp 97.6 F (36.4 C) (Oral)   Resp 16   Ht 5\' 10"  (1.778 m)   Wt 77.1 kg   SpO2 100%    BMI 24.39 kg/m    General: Awake, no distress.  CV:  Good peripheral perfusion.  Resp:  Normal effort.  Abd:  No distention.  Other:  Intoxicated.  Not agitated.   ED Results / Procedures / Treatments  Labs (all labs ordered are listed, but only abnormal results are displayed) Labs Reviewed  CBC  COMPREHENSIVE METABOLIC PANEL  ETHANOL     EKG  None   RADIOLOGY None   Official radiology report(s): No results found.   PROCEDURES:  Critical Care performed: No  Procedures   MEDICATIONS ORDERED IN ED: Medications  sodium chloride 0.9 % bolus 1,000 mL (has no administration in time range)  sodium chloride 0.9 % 1,000 mL with thiamine 100 mg, folic acid 1 mg, M.V.I. Adult 10 mL infusion (has no administration in time range)  thiamine (VITAMIN B1) tablet 100 mg (has no administration in time range)  LORazepam (ATIVAN) injection 0-4 mg (has no administration in time range)     IMPRESSION / MDM / ASSESSMENT AND PLAN / ED COURSE  I reviewed the triage vital signs and the nursing notes.                             36 year old male brought to the emergency department for intoxication and homelessness.  Denies SI/HI/AH/VH.  Will initiate IV fluids, placed on CIWA and monitor until sobriety.  Patient's presentation is most consistent with acute, uncomplicated illness.   FINAL CLINICAL IMPRESSION(S) / ED DIAGNOSES   Final diagnoses:  Alcoholic intoxication without complication [facility])  Homeless     Rx / DC Orders   ED Discharge Orders     None        Note:  This document was prepared using Dragon voice recognition software and may include unintentional dictation errors.

## 2022-05-24 NOTE — Congregational Nurse Program (Signed)
  Dept: [phone]   Congregational Nurse Program Note  Date of Encounter: 05/24/2022 Client to [facility] clinic today in need of support. He was recently discharged from the [facility] Emergency room, treated for alcohol intoxication. Client chose not to pursue either of the options for detox that were given in the ER. He continues to struggle with his heavy drinking. RN provided active listening and support. No other needs at this time. Past Medical History: Past Medical History:  Diagnosis Date   Diabetes mellitus without complication ([place])    Graves disease    Thyroid disease     Encounter Details:  CNP Questionnaire - 05/24/22 0900       Questionnaire   Ask client: Do you give verbal consent for me to treat you today? Yes    Student Assistance N/A    Location Patient [place]    Visit Setting with Client Organization    Patient Status Unhoused    Insurance/Financial Assistance Referral Affordable Care [facility])    Medication N/A    Medical Provider No   [facility], but [facility]. He now has private insurance through the affordable care act. Does not have a PCP   Screening Referrals Made N/A    Medical Referrals Made N/A    Medical Appointment Made N/A    Recently w/o PCP, now 1st time PCP visit completed due to CNs referral or appointment made N/A    Food Have Food Insecurities   client is unhoused, eats at [facility] and other places, is aware of Electrical engineer N/A   client uses the [employer] bus at this time   Housing/Utilities No permanent housing    Chiropractor N/A   client is homeless and sleeping in a car or outside   Interventions Advocate/Support    Abnormal to Normal Screening Since Last CN Visit N/A    Screenings CN Performed N/A    Sent Client to Lab for: N/A    Did client attend any of the following based off CNs referral or appointments made? N/A    ED Visit Averted N/A     Life-Saving Intervention Made N/A

## 2022-05-25 LAB — COMPREHENSIVE METABOLIC PANEL
ALT: 25 U/L (ref 0–44)
AST: 48 U/L — ABNORMAL HIGH (ref 15–41)
Albumin: 4.3 g/dL (ref 3.5–5.0)
Alkaline Phosphatase: 88 U/L (ref 38–126)
Anion gap: 7 (ref 5–15)
BUN: 9 mg/dL (ref 6–20)
CO2: 28 mmol/L (ref 22–32)
Calcium: 8.5 mg/dL — ABNORMAL LOW (ref 8.9–10.3)
Chloride: 107 mmol/L (ref 98–111)
Creatinine, Ser: 1.25 mg/dL — ABNORMAL HIGH (ref 0.61–1.24)
GFR, Estimated: >60 mL/min (ref >60)
Glucose, Bld: 97 mg/dL (ref 70–99)
Potassium: 3.2 mmol/L — ABNORMAL LOW (ref 3.5–5.1)
Sodium: 142 mmol/L (ref 135–145)
Total Bilirubin: 0.5 mg/dL (ref 0.3–1.2)
Total Protein: 7.1 g/dL (ref 6.5–8.1)

## 2022-05-25 LAB — CBC
HCT: 41.5 % (ref 39.0–52.0)
Hemoglobin: 13.5 g/dL (ref 13.0–17.0)
MCH: 32.6 pg (ref 26.0–34.0)
MCHC: 32.5 g/dL (ref 30.0–36.0)
MCV: 100.2 fL — ABNORMAL HIGH (ref 80.0–100.0)
Platelets: 102 10*3/uL — ABNORMAL LOW (ref 150–400)
RBC: 4.14 MIL/uL — ABNORMAL LOW (ref 4.22–5.81)
RDW: 17.1 % — ABNORMAL HIGH (ref 11.5–15.5)
WBC: 8.5 10*3/uL (ref 4.0–10.5)
nRBC: 0 % (ref 0.0–0.2)

## 2022-05-25 LAB — ETHANOL: Alcohol, Ethyl (B): 398 mg/dL (ref <10)

## 2022-05-25 MED ORDER — POTASSIUM CHLORIDE CRYS ER 20 MEQ PO TBCR
40.0000 meq | EXTENDED_RELEASE_TABLET | Freq: Once | ORAL | Status: AC
  Administered 2022-05-25: 40 meq via ORAL
  Filled 2022-05-25: qty 2

## 2022-05-25 NOTE — ED Notes (Signed)
Pt able to eat and drink without difficulties. Ambulates safely. Left with all of belongings.Verified correct patient and correct discharge papers given. Pt alert and oriented X 4, stable for discharge. RR even and unlabored, color WNL. Discussed discharge instructions and follow-up as directed. Discharge medications discussed, when prescribed. Pt had opportunity to ask questions, and RN available to provide patient and/or family education.

## 2022-05-25 NOTE — ED Notes (Signed)
VOL/Detox/Pending D/C

## 2022-05-25 NOTE — ED Notes (Signed)
Pt not awake or alert enough to take medication safely. Verified with Dr. Beather Arbour, will hold and administer to pt once he wakes up

## 2022-05-25 NOTE — ED Provider Notes (Signed)
-----------------------------------------   7:10 AM on 05/25/2022 -----------------------------------------  Blood pressure 106/72, pulse 83, temperature 97.6 F (36.4 C), temperature source Oral, resp. rate 16, height 5\' 10"  (1.778 m), weight 77.1 kg, SpO2 100 %.  Assuming care from Dr. Beather Arbour.  In short, Joshua Medina is a 36 y.o. male with a chief complaint of Alcohol Intoxication and Homeless .  Refer to the original H&P for additional details.  The current plan of care is to observe until clinically sober.  ----------------------------------------- 9:17 AM on 05/25/2022 ----------------------------------------- Patient appears clinically sober on reassessment and is appropriate for discharge home.    Blake Divine, MD 05/25/22 [phone]

## 2022-05-25 NOTE — ED Notes (Signed)
Awake, alert, oriented X 4. EDP at bedside to reassess

## 2022-06-01 NOTE — Congregational Nurse Program (Signed)
  Dept: [phone]   Congregational Nurse Program Note  Date of Encounter: 06/01/2022 Client to [facility] clinic today in need of shoe repair.  Shoe repaired, cough drops given and peppermint essential oil used for some nasal congestion. RN will continue to attempt to help client set up a PCP. He does not come regularly to the clinic and now has private insurance through the exchange, but no card. Support given. Past Medical History: Past Medical History:  Diagnosis Date   Diabetes mellitus without complication ([place])    Graves disease    Thyroid disease     Encounter Details:  CNP Questionnaire - 06/01/22 0853       Questionnaire   Ask client: Do you give verbal consent for me to treat you today? Yes    Student Assistance N/A    Location Patient [place]    Visit Setting with Client Organization    Patient Status Unhoused    Sport and exercise psychologist or [place]    Insurance/Financial Assistance Referral Affordable Care (ACA)    Medication Have Medication Insecurities    Medical Provider No   [facility], but [facility]. He now has private insurance through the affordable care act. Does not have a PCP   Screening Referrals Made N/A    Medical Referrals Made N/A    Medical Appointment Made N/A    Recently w/o PCP, now 1st time PCP visit completed due to CNs referral or appointment made N/A    Food Have Food Insecurities   client is unhoused, eats at [facility] and other places, is aware of Electrical engineer N/A   client uses the [employer] bus at this time   Housing/Utilities No permanent housing    Chiropractor N/A   client is homeless and sleeping in a car or outside   Interventions Advocate/Support    Abnormal to Normal Screening Since Last CN Visit N/A    Screenings CN Performed N/A    Sent Client to Lab for: N/A    Did client attend any of the following based off CNs referral or appointments  made? N/A    ED Visit Averted N/A    Life-Saving Intervention Made N/A

## 2022-06-09 ENCOUNTER — Other Ambulatory Visit: Payer: Self-pay

## 2022-06-09 ENCOUNTER — Emergency Department
Admission: EM | Admit: 2022-06-09 | Discharge: 2022-06-09 | Disposition: A | Discharge status: Home / Self Care | Payer: BLUE CROSS/BLUE SHIELD | Attending: Emergency Medicine | Admitting: Emergency Medicine

## 2022-06-09 ENCOUNTER — Emergency Department
Admission: EM | Admit: 2022-06-09 | Discharge: 2022-06-10 | Disposition: A | Discharge status: Home / Self Care | Payer: BLUE CROSS/BLUE SHIELD | Source: Home / Self Care

## 2022-06-09 ENCOUNTER — Encounter: Payer: Self-pay | Admitting: Emergency Medicine

## 2022-06-09 DIAGNOSIS — F10120 Alcohol abuse with intoxication, uncomplicated: Secondary | ICD-10-CM | POA: Insufficient documentation

## 2022-06-09 DIAGNOSIS — F10920 Alcohol use, unspecified with intoxication, uncomplicated: Secondary | ICD-10-CM

## 2022-06-09 NOTE — ED Provider Notes (Signed)
   [facility] Provider Note    Event Date/Time   First MD Initiated Contact with Patient 06/09/22 [phone]     (approximate)   History   Alcohol Intoxication   HPI  Joshua Medina is a 36 y.o. male who presents to the ED for evaluation of Alcohol Intoxication   Patient is well-known to me for his recurrent visits of alcoholism and associated complications presents because he is drunk.  He reports "drinking too much tonight."  He reports he is hungry and requesting a [place] sandwich.  Denies SI, abdominal pain or bleeding.   Physical Exam   Triage Vital Signs: ED Triage Vitals  Enc Vitals Group     BP 06/09/22 0101 (!) 113/91     Pulse Rate 06/09/22 0101 76     Resp 06/09/22 0101 15     Temp 06/09/22 0101 97.7 F (36.5 C)     Temp Source 06/09/22 0101 Oral     SpO2 06/09/22 0101 100 %     Weight --      Height --      Head Circumference --      Peak Flow --      Pain Score 06/09/22 0054 0     Pain Loc --      Pain Edu? --      Excl. in [place]? --     Most recent vital signs: Vitals:   06/09/22 0101  BP: (!) 113/91  Pulse: 76  Resp: 15  Temp: 97.7 F (36.5 C)  SpO2: 100%    General: Awake, no distress.  CV:  Good peripheral perfusion.  Resp:  Normal effort.  Abd:  No distention.  MSK:  No deformity noted.  Neuro:  No focal deficits appreciated. Other:     ED Results / Procedures / Treatments   Labs (all labs ordered are listed, but only abnormal results are displayed) Labs Reviewed - No data to display  EKG   RADIOLOGY   Official radiology report(s): No results found.  PROCEDURES and INTERVENTIONS:  Procedures  Medications - No data to display   IMPRESSION / MDM / [place] / ED COURSE  I reviewed the triage vital signs and the nursing notes.  Differential diagnosis includes, but is not limited to, alcohol desiccation, upper GI bleed, malingering, suicidal  {Patient presents with symptoms of an acute  illness or injury that is potentially life-threatening.  36 year old male presents drunk again.  He is suitable for outpatient management.  No signs of psychiatric emergency.  No symptoms of more severe pathology.  Provided food, warm blanket, discharge      FINAL CLINICAL IMPRESSION(S) / ED DIAGNOSES   Final diagnoses:  Alcoholic intoxication without complication ([place])     Rx / DC Orders   ED Discharge Orders     None        Note:  This document was prepared using Dragon voice recognition software and may include unintentional dictation errors.   Vladimir Crofts, MD 06/09/22 [phone]

## 2022-06-09 NOTE — ED Triage Notes (Signed)
Pt to ED via [facility] c/o alcohol intoxication. Pt endorses drinking but does not know how much he drank. Pt denies CP, SOB

## 2022-06-09 NOTE — ED Notes (Addendum)
First Nurse Note: Pt presents via EMS for alcohol withdrawal - states he drank 30 mins ago. States he "drank a lot". Pt asleep in wheelchair. Respirations equal and unlabored.  85 HR 100% RA 106/70 CBG -108

## 2022-06-09 NOTE — Discharge Instructions (Signed)
Stop drinking so much 

## 2022-06-09 NOTE — ED Notes (Signed)
Pt provided a water and sandwich tray.

## 2022-06-10 NOTE — ED Notes (Signed)
Pt caught smoking in the women's and men's restroom in the lobby on 06/10/22 after being D/C. Security called to have the patient escorted off the campus - pt left and returned to the lobby shortly after and was attempting to hide in the corner. Charge Butters made aware that the patients refusal to leave. [employer] called and patient escorted off the property.

## 2022-06-10 NOTE — ED Provider Notes (Signed)
   [facility] Provider Note    Event Date/Time   First MD Initiated Contact with Patient 06/10/22 [phone]     (approximate)   History   Alcohol Problem   HPI  Joshua Medina is a 36 y.o. male who presents to the ED for evaluation of Alcohol Problem   Patient is well-known to me for his recurrent visits of alcoholism and associated complications presents because he is drunk.  He reports "drinking too much tonight."  He reports he is hungry and requesting a [place] sandwich.  Denies SI, abdominal pain or bleeding.   Physical Exam   Triage Vital Signs: ED Triage Vitals  Enc Vitals Group     BP 06/09/22 0101 (!) 113/91     Pulse Rate 06/09/22 0101 76     Resp 06/09/22 0101 15     Temp 06/09/22 0101 97.7 F (36.5 C)     Temp Source 06/09/22 0101 Oral     SpO2 06/09/22 0101 100 %     Weight --      Height --      Head Circumference --      Peak Flow --      Pain Score 06/09/22 0054 0     Pain Loc --      Pain Edu? --      Excl. in [place]? --     Most recent vital signs: Vitals:   06/09/22 2322  BP: 99/77  Pulse: 76  Resp: 18  Temp: 97.6 F (36.4 C)  SpO2: 100%    General: Awake, no distress.  CV:  Good peripheral perfusion.  Resp:  Normal effort.  Abd:  No distention.  MSK:  No deformity noted.  Neuro:  No focal deficits appreciated. Other:     ED Results / Procedures / Treatments   Labs (all labs ordered are listed, but only abnormal results are displayed) Labs Reviewed - No data to display  EKG   RADIOLOGY   Official radiology report(s): No results found.  PROCEDURES and INTERVENTIONS:  Procedures  Medications - No data to display   IMPRESSION / MDM / [place] / ED COURSE  I reviewed the triage vital signs and the nursing notes.  Differential diagnosis includes, but is not limited to, alcohol desiccation, upper GI bleed, malingering, suicidal  {Patient presents with symptoms of an acute illness or injury  that is potentially life-threatening.  36 year old male presents drunk again.  He is suitable for outpatient management.  No signs of psychiatric emergency.  No symptoms of more severe pathology.  Provided food, warm blanket, discharge      FINAL CLINICAL IMPRESSION(S) / ED DIAGNOSES   Final diagnoses:  Alcoholic intoxication without complication ([place])     Rx / DC Orders   ED Discharge Orders     None        Note:  This document was prepared using Dragon voice recognition software and may include unintentional dictation errors.   Vladimir Crofts, MD 06/10/22 4235    Vladimir Crofts, MD 06/10/22 [phone]

## 2022-06-10 NOTE — ED Notes (Addendum)
Pt refused to sign for D/C information & declined repeat VS. Pt provided a sandwich tray and water.

## 2022-06-13 NOTE — Congregational Nurse Program (Signed)
  Dept: [phone]   Congregational Nurse Program Note  Date of Encounter: 06/13/2022 [facility] with request for assistance with labs. He reports he was told that he needed his iron checked when he went to donate plasma. Client now has [employer]. RN discussed finding a PCP who could then order labs. Physicians located in [place] and numbers given to client as he declined assistance from RN with making an appointment at this time. RN checked Epic for results for client, no labs taken at last visit to the [facility] ED on 10/21. Phone number for [facility] given to client as he wished to contact medical records. No other needs at this time. RN reinforced to client the importance of having a PCP as he has not been taking his synthroid in several months. He declined assistance at this visit. RN to continue to offer support.   Past Medical History: Past Medical History:  Diagnosis Date   Diabetes mellitus without complication ([place])    Graves disease    Thyroid disease     Encounter Details:  CNP Questionnaire - 06/13/22 0930       Questionnaire   Ask client: Do you give verbal consent for me to treat you today? Yes    Student Assistance N/A    Location Patient [place]    Visit Setting with Client Organization    Patient Status Unhoused    Sport and exercise psychologist or [place]    Insurance/Financial Assistance Referral Affordable Care (ACA)    Medication Have Medication Insecurities    Medical Provider No   Client [facility], but was not seen. He now has private insurance through the affordable care act. Does not have a PCP   Screening Referrals Made N/A    Medical Referrals Made N/A    Medical Appointment Made N/A    Recently w/o PCP, now 1st time PCP visit completed due to CNs referral or appointment made N/A    Food Have Food Insecurities   client is unhoused, eats at [facility] and other places, is aware of  Electrical engineer N/A   client uses the [employer] bus at this time   Housing/Utilities No permanent housing    Chiropractor N/A   client is homeless and sleeping in a car or outside   Interventions Advocate/Support    Abnormal to Normal Screening Since Last CN Visit N/A    Screenings CN Performed N/A    Sent Client to Lab for: N/A    Did client attend any of the following based off CNs referral or appointments made? N/A    ED Visit Averted N/A    Life-Saving Intervention Made N/A

## 2022-07-18 ENCOUNTER — Other Ambulatory Visit: Payer: Self-pay

## 2022-07-18 DIAGNOSIS — F10120 Alcohol abuse with intoxication, uncomplicated: Secondary | ICD-10-CM | POA: Diagnosis not present

## 2022-07-18 DIAGNOSIS — Y908 Blood alcohol level of 240 mg/100 ml or more: Secondary | ICD-10-CM | POA: Insufficient documentation

## 2022-07-18 DIAGNOSIS — E119 Type 2 diabetes mellitus without complications: Secondary | ICD-10-CM | POA: Insufficient documentation

## 2022-07-18 DIAGNOSIS — F10129 Alcohol abuse with intoxication, unspecified: Secondary | ICD-10-CM | POA: Diagnosis present

## 2022-07-18 NOTE — ED Triage Notes (Addendum)
Pt comes [place] [facility] via [facility] c/o alcohol intoxication. Pt states he's here for "acute/long alcoholism". Pt states he has been drinking but does not know how much he's had to drink. Pt is homeless. Denies CP, SOB. NAD at this time.   89HR 99% RA 130/90

## 2022-07-19 ENCOUNTER — Emergency Department
Admission: EM | Admit: 2022-07-19 | Discharge: 2022-07-19 | Disposition: A | Discharge status: Home / Self Care | Payer: BLUE CROSS/BLUE SHIELD | Attending: Emergency Medicine | Admitting: Emergency Medicine

## 2022-07-19 DIAGNOSIS — F10920 Alcohol use, unspecified with intoxication, uncomplicated: Secondary | ICD-10-CM

## 2022-07-19 LAB — CBC WITH DIFFERENTIAL/PLATELET
Abs Immature Granulocytes: 0.08 10*3/uL — ABNORMAL HIGH (ref 0.00–0.07)
Basophils Absolute: 0.2 10*3/uL — ABNORMAL HIGH (ref 0.0–0.1)
Basophils Relative: 2 %
Eosinophils Absolute: 0.1 10*3/uL (ref 0.0–0.5)
Eosinophils Relative: 1 %
HCT: 48.1 % (ref 39.0–52.0)
Hemoglobin: 16 g/dL (ref 13.0–17.0)
Immature Granulocytes: 1 %
Lymphocytes Relative: 31 %
Lymphs Abs: 3.1 10*3/uL (ref 0.7–4.0)
MCH: 33.2 pg (ref 26.0–34.0)
MCHC: 33.3 g/dL (ref 30.0–36.0)
MCV: 99.8 fL (ref 80.0–100.0)
Monocytes Absolute: 0.5 10*3/uL (ref 0.1–1.0)
Monocytes Relative: 5 %
Neutro Abs: 6 10*3/uL (ref 1.7–7.7)
Neutrophils Relative %: 60 %
Platelets: 149 10*3/uL — ABNORMAL LOW (ref 150–400)
RBC: 4.82 MIL/uL (ref 4.22–5.81)
RDW: 15 % (ref 11.5–15.5)
WBC: 10 10*3/uL (ref 4.0–10.5)
nRBC: 0 % (ref 0.0–0.2)

## 2022-07-19 LAB — BASIC METABOLIC PANEL
Anion gap: 13 (ref 5–15)
BUN: 14 mg/dL (ref 6–20)
CO2: 25 mmol/L (ref 22–32)
Calcium: 9.5 mg/dL (ref 8.9–10.3)
Chloride: 104 mmol/L (ref 98–111)
Creatinine, Ser: 1.11 mg/dL (ref 0.61–1.24)
GFR, Estimated: >60 mL/min (ref >60)
Glucose, Bld: 74 mg/dL (ref 70–99)
Potassium: 3.8 mmol/L (ref 3.5–5.1)
Sodium: 142 mmol/L (ref 135–145)

## 2022-07-19 LAB — ETHANOL: Alcohol, Ethyl (B): 253 mg/dL — ABNORMAL HIGH (ref <10)

## 2022-07-19 MED ORDER — THIAMINE HCL 100 MG/ML IJ SOLN: 100.0000 mg | Freq: Every day | INTRAMUSCULAR | Status: DC

## 2022-07-19 MED ORDER — LORAZEPAM 2 MG PO TABS: 0.0000 mg | ORAL_TABLET | Freq: Two times a day (BID) | ORAL | Status: DC

## 2022-07-19 MED ORDER — NICOTINE 21 MG/24HR TD PT24: 21.0000 mg | MEDICATED_PATCH | Freq: Every day | TRANSDERMAL | Status: DC

## 2022-07-19 MED ORDER — IBUPROFEN 600 MG PO TABS: 600.0000 mg | ORAL_TABLET | Freq: Three times a day (TID) | ORAL | Status: DC | PRN

## 2022-07-19 MED ORDER — ONDANSETRON HCL 4 MG PO TABS: 4.0000 mg | ORAL_TABLET | Freq: Three times a day (TID) | ORAL | Status: DC | PRN

## 2022-07-19 MED ORDER — LORAZEPAM 2 MG/ML IJ SOLN: 0.0000 mg | Freq: Two times a day (BID) | INTRAMUSCULAR | Status: DC

## 2022-07-19 MED ORDER — LORAZEPAM 2 MG/ML IJ SOLN: 0.0000 mg | Freq: Four times a day (QID) | INTRAMUSCULAR | Status: DC

## 2022-07-19 MED ORDER — THIAMINE MONONITRATE 100 MG PO TABS: 100.0000 mg | ORAL_TABLET | Freq: Every day | ORAL | Status: DC

## 2022-07-19 MED ORDER — LORAZEPAM 2 MG PO TABS: 0.0000 mg | ORAL_TABLET | Freq: Four times a day (QID) | ORAL | Status: DC

## 2022-07-19 MED ORDER — ALUM & MAG HYDROXIDE-SIMETH 200-200-20 MG/5ML PO SUSP: 30.0000 mL | Freq: Four times a day (QID) | ORAL | Status: DC | PRN

## 2022-07-19 NOTE — ED Notes (Signed)
Discharge instructions provided by EDP were reinforced by this RN. Pt verbalized understanding with no questions at this time. Pt ambulatory to lobby at discharge. Provided with coffee per pt request.

## 2022-07-19 NOTE — ED Notes (Signed)
Pt was brought back from lobby via wheelchair by NT. Pt was able to transfer self from wheelchair to bed. Pt now sleeping with blankets over his head.

## 2022-07-19 NOTE — ED Notes (Signed)
Cleared for PO fluids and meds by EDP. Provided pt with ED sandwich meal and PO fluids.

## 2022-07-19 NOTE — ED Provider Notes (Signed)
[facility] Provider Note    Event Date/Time   First MD Initiated Contact with Patient 07/19/22 0104     (approximate)   History   Alcohol Intoxication   HPI  Joshua Medina is a 36 y.o. male with a past history of alcohol dependence, diabetes, Graves' disease, GI bleed who comes ED reporting alcohol intoxication.  Denies any pain or nausea, no vomiting.  Reports he has been eating okay.  Denies falls or head trauma.     Physical Exam   Triage Vital Signs: ED Triage Vitals  Enc Vitals Group     BP 07/18/22 2243 110/86     Pulse Rate 07/18/22 2243 75     Resp 07/18/22 2243 19     Temp 07/18/22 2243 98.1 F (36.7 C)     Temp Source 07/18/22 2243 Oral     SpO2 07/18/22 2243 99 %     Weight --      Height 07/18/22 2243 5\' 10"  (1.778 m)     Head Circumference --      Peak Flow --      Pain Score 07/18/22 2243 0     Pain Loc --      Pain Edu? --      Excl. in [place]? --     Most recent vital signs: Vitals:   07/18/22 2243 07/19/22 0147  BP: 110/86 111/82  Pulse: 75 83  Resp: 19 15  Temp: 98.1 F (36.7 C)   SpO2: 99% 99%    General: Awake, no distress.  CV:  Good peripheral perfusion.  Regular rate and rhythm Resp:  Normal effort.  Abd:  No distention.  Soft nontender Other:  No evidence of trauma.  Clear speech   ED Results / Procedures / Treatments   Labs (all labs ordered are listed, but only abnormal results are displayed) Labs Reviewed  CBC WITH DIFFERENTIAL/PLATELET - Abnormal; Notable for the following components:      Result Value   Platelets 149 (*)    Basophils Absolute 0.2 (*)    Abs Immature Granulocytes 0.08 (*)    All other components within normal limits  ETHANOL - Abnormal; Notable for the following components:   Alcohol, Ethyl (B) 253 (*)    All other components within normal limits  BASIC METABOLIC PANEL     RADIOLOGY    PROCEDURES:  Procedures   MEDICATIONS ORDERED IN ED: Medications  ibuprofen  (ADVIL) tablet 600 mg (has no administration in time range)  ondansetron (ZOFRAN) tablet 4 mg (has no administration in time range)  alum & mag hydroxide-simeth (MAALOX/MYLANTA) 200-200-20 MG/5ML suspension 30 mL (has no administration in time range)  nicotine (NICODERM CQ - dosed in mg/24 hours) patch 21 mg (has no administration in time range)  LORazepam (ATIVAN) injection 0-4 mg ( Intravenous See Alternative 07/19/22 0149)    Or  LORazepam (ATIVAN) tablet 0-4 mg (0 mg Oral Hold 07/19/22 0149)  LORazepam (ATIVAN) injection 0-4 mg (has no administration in time range)    Or  LORazepam (ATIVAN) tablet 0-4 mg (has no administration in time range)  thiamine (VITAMIN B1) tablet 100 mg (has no administration in time range)    Or  thiamine (VITAMIN B1) injection 100 mg (has no administration in time range)     IMPRESSION / MDM / ASSESSMENT AND PLAN / ED COURSE  I reviewed the triage vital signs and the nursing notes.  Differential diagnosis includes, but is not limited to, alcohol intoxication, dehydration, electrolyte abnormality, AKI, anemia  Patient comes ED complaining of alcohol intoxication.  He reports homelessness, and temperature tonight is in the 20s.  We will need to check labs due to his history of diabetes to evaluate for metabolic derangement, check his hemoglobin level/CBC due to history of liver disease and GI bleeding.  Denies any symptoms of acute blood loss.  We will observe in ED until clinically sober, check CIWA scale. Clinical Course as of 07/19/22 0634  Wed Jul 19, 2022  0238 Labs unremarkable.  Vitals remain normal.  Medically stable [PS]    Clinical Course User Index [PS] Sharman Cheek, MD   ----------------------------------------- 6:34 AM on 07/19/2022 ----------------------------------------- Clinically sober, stable for discharge.   FINAL CLINICAL IMPRESSION(S) / ED DIAGNOSES   Final diagnoses:  Alcoholic intoxication  without complication (HCC)     Rx / DC Orders   ED Discharge Orders     None        Note:  This document was prepared using Dragon voice recognition software and may include unintentional dictation errors.   Sharman Cheek, MD 07/19/22 [phone]

## 2022-07-28 ENCOUNTER — Emergency Department: Payer: BLUE CROSS/BLUE SHIELD

## 2022-07-28 ENCOUNTER — Other Ambulatory Visit: Payer: Self-pay

## 2022-07-28 ENCOUNTER — Encounter: Payer: Self-pay | Admitting: Emergency Medicine

## 2022-07-28 DIAGNOSIS — Y908 Blood alcohol level of 240 mg/100 ml or more: Secondary | ICD-10-CM | POA: Insufficient documentation

## 2022-07-28 DIAGNOSIS — F1010 Alcohol abuse, uncomplicated: Secondary | ICD-10-CM | POA: Insufficient documentation

## 2022-07-28 DIAGNOSIS — S0081XA Abrasion of other part of head, initial encounter: Secondary | ICD-10-CM | POA: Diagnosis not present

## 2022-07-28 DIAGNOSIS — Z5900 Homelessness unspecified: Secondary | ICD-10-CM | POA: Diagnosis not present

## 2022-07-28 DIAGNOSIS — W19XXXA Unspecified fall, initial encounter: Secondary | ICD-10-CM | POA: Insufficient documentation

## 2022-07-28 DIAGNOSIS — W01198A Fall on same level from slipping, tripping and stumbling with subsequent striking against other object, initial encounter: Secondary | ICD-10-CM | POA: Diagnosis not present

## 2022-07-28 DIAGNOSIS — F10129 Alcohol abuse with intoxication, unspecified: Secondary | ICD-10-CM | POA: Diagnosis present

## 2022-07-28 LAB — URINE DRUG SCREEN, QUALITATIVE (ARMC ONLY)
Amphetamines, Ur Screen: NOT DETECTED
Barbiturates, Ur Screen: NOT DETECTED
Benzodiazepine, Ur Scrn: NOT DETECTED
Cannabinoid 50 Ng, Ur ~~LOC~~: NOT DETECTED
Cocaine Metabolite,Ur ~~LOC~~: NOT DETECTED
MDMA (Ecstasy)Ur Screen: NOT DETECTED
Methadone Scn, Ur: NOT DETECTED
Opiate, Ur Screen: NOT DETECTED
Phencyclidine (PCP) Ur S: NOT DETECTED
Tricyclic, Ur Screen: NOT DETECTED

## 2022-07-28 LAB — CBC
HCT: 44.6 % (ref 39.0–52.0)
Hemoglobin: 14.6 g/dL (ref 13.0–17.0)
MCH: 33.4 pg (ref 26.0–34.0)
MCHC: 32.7 g/dL (ref 30.0–36.0)
MCV: 102.1 fL — ABNORMAL HIGH (ref 80.0–100.0)
Platelets: 127 10*3/uL — ABNORMAL LOW (ref 150–400)
RBC: 4.37 MIL/uL (ref 4.22–5.81)
RDW: 14.9 % (ref 11.5–15.5)
WBC: 8.2 10*3/uL (ref 4.0–10.5)
nRBC: 0 % (ref 0.0–0.2)

## 2022-07-28 LAB — COMPREHENSIVE METABOLIC PANEL
ALT: 21 U/L (ref 0–44)
AST: 57 U/L — ABNORMAL HIGH (ref 15–41)
Albumin: 4.8 g/dL (ref 3.5–5.0)
Alkaline Phosphatase: 87 U/L (ref 38–126)
Anion gap: 10 (ref 5–15)
BUN: 9 mg/dL (ref 6–20)
CO2: 29 mmol/L (ref 22–32)
Calcium: 9.6 mg/dL (ref 8.9–10.3)
Chloride: 106 mmol/L (ref 98–111)
Creatinine, Ser: 1.14 mg/dL (ref 0.61–1.24)
GFR, Estimated: >60 mL/min (ref >60)
Glucose, Bld: 98 mg/dL (ref 70–99)
Potassium: 4.8 mmol/L (ref 3.5–5.1)
Sodium: 145 mmol/L (ref 135–145)
Total Bilirubin: 0.6 mg/dL (ref 0.3–1.2)
Total Protein: 8.2 g/dL — ABNORMAL HIGH (ref 6.5–8.1)

## 2022-07-28 NOTE — ED Provider Triage Note (Signed)
Emergency Medicine Provider Triage Evaluation Note  Joshua Medina, a 36 y.o. male with a history of alcohol use disorder, alcoholic liver disease, diabetes, and thyroid disease, was evaluated in triage.  Pt complains of alcohol use disorder with a mechanical fall 2 days prior.  He does endorse a desire for alcohol detox treatment at this time.  Review of Systems  Positive: EtOH use disorder, head injury Negative: NV  Physical Exam  BP 109/73 (BP Location: Left Arm)   Pulse 82   Temp 98 F (36.7 C) (Oral)   Resp 16   Ht 5\' 10"  (1.778 m)   Wt 71 kg   SpO2 100%   BMI 22.46 kg/m  Gen:   Awake, no distress  NAD Resp:  Normal effort CTA MSK:   Moves extremities without difficulty  Other:    Medical Decision Making  Medically screening exam initiated at 8:40 PM.  Appropriate orders placed.  Joshua Medina was informed that the remainder of the evaluation will be completed by another provider, this initial triage assessment does not replace that evaluation, and the importance of remaining in the ED until their evaluation is complete.  Patient to the ED for evaluation of mechanical fall secondary to alcohol intoxication, he is also requesting alcohol detox from ETOH use disorder   Townsend Roger, Karmen Stabs, PA-C 07/28/22 2042

## 2022-07-28 NOTE — ED Triage Notes (Signed)
Pt to ED via EMS c/o ETOH use tonight and wanting help, mechanical fall yesterday with bruise under left eye.  Pt homeless.  Calm and cooperative.

## 2022-07-29 ENCOUNTER — Emergency Department
Admission: EM | Admit: 2022-07-29 | Discharge: 2022-07-30 | Disposition: A | Discharge status: Home / Self Care | Payer: BLUE CROSS/BLUE SHIELD | Source: Home / Self Care | Attending: Emergency Medicine | Admitting: Emergency Medicine

## 2022-07-29 ENCOUNTER — Emergency Department
Admission: EM | Admit: 2022-07-29 | Discharge: 2022-07-29 | Disposition: A | Discharge status: Home / Self Care | Payer: BLUE CROSS/BLUE SHIELD | Attending: Emergency Medicine | Admitting: Emergency Medicine

## 2022-07-29 ENCOUNTER — Emergency Department: Payer: BLUE CROSS/BLUE SHIELD

## 2022-07-29 ENCOUNTER — Other Ambulatory Visit: Payer: Self-pay

## 2022-07-29 DIAGNOSIS — W19XXXA Unspecified fall, initial encounter: Secondary | ICD-10-CM

## 2022-07-29 DIAGNOSIS — F10129 Alcohol abuse with intoxication, unspecified: Secondary | ICD-10-CM | POA: Insufficient documentation

## 2022-07-29 DIAGNOSIS — S0081XA Abrasion of other part of head, initial encounter: Secondary | ICD-10-CM | POA: Insufficient documentation

## 2022-07-29 DIAGNOSIS — Y908 Blood alcohol level of 240 mg/100 ml or more: Secondary | ICD-10-CM | POA: Insufficient documentation

## 2022-07-29 DIAGNOSIS — W01198A Fall on same level from slipping, tripping and stumbling with subsequent striking against other object, initial encounter: Secondary | ICD-10-CM | POA: Insufficient documentation

## 2022-07-29 DIAGNOSIS — F1010 Alcohol abuse, uncomplicated: Secondary | ICD-10-CM

## 2022-07-29 DIAGNOSIS — F10920 Alcohol use, unspecified with intoxication, uncomplicated: Secondary | ICD-10-CM

## 2022-07-29 DIAGNOSIS — Z5900 Homelessness unspecified: Secondary | ICD-10-CM

## 2022-07-29 LAB — CBC WITH DIFFERENTIAL/PLATELET
Abs Immature Granulocytes: 0.02 10*3/uL (ref 0.00–0.07)
Basophils Absolute: 0.2 10*3/uL — ABNORMAL HIGH (ref 0.0–0.1)
Basophils Relative: 2 %
Eosinophils Absolute: 0.1 10*3/uL (ref 0.0–0.5)
Eosinophils Relative: 2 %
HCT: 44.9 % (ref 39.0–52.0)
Hemoglobin: 14.5 g/dL (ref 13.0–17.0)
Immature Granulocytes: 0 %
Lymphocytes Relative: 50 %
Lymphs Abs: 3.8 10*3/uL (ref 0.7–4.0)
MCH: 33 pg (ref 26.0–34.0)
MCHC: 32.3 g/dL (ref 30.0–36.0)
MCV: 102 fL — ABNORMAL HIGH (ref 80.0–100.0)
Monocytes Absolute: 0.6 10*3/uL (ref 0.1–1.0)
Monocytes Relative: 8 %
Neutro Abs: 2.9 10*3/uL (ref 1.7–7.7)
Neutrophils Relative %: 38 %
Platelets: 143 10*3/uL — ABNORMAL LOW (ref 150–400)
RBC: 4.4 MIL/uL (ref 4.22–5.81)
RDW: 14.8 % (ref 11.5–15.5)
WBC: 7.6 10*3/uL (ref 4.0–10.5)
nRBC: 0 % (ref 0.0–0.2)

## 2022-07-29 LAB — URINE DRUG SCREEN, QUALITATIVE (ARMC ONLY)
Amphetamines, Ur Screen: NOT DETECTED
Barbiturates, Ur Screen: NOT DETECTED
Benzodiazepine, Ur Scrn: NOT DETECTED
Cannabinoid 50 Ng, Ur ~~LOC~~: NOT DETECTED
Cocaine Metabolite,Ur ~~LOC~~: NOT DETECTED
MDMA (Ecstasy)Ur Screen: NOT DETECTED
Methadone Scn, Ur: NOT DETECTED
Opiate, Ur Screen: NOT DETECTED
Phencyclidine (PCP) Ur S: NOT DETECTED
Tricyclic, Ur Screen: NOT DETECTED

## 2022-07-29 LAB — BASIC METABOLIC PANEL
Anion gap: 12 (ref 5–15)
BUN: 8 mg/dL (ref 6–20)
CO2: 28 mmol/L (ref 22–32)
Calcium: 9 mg/dL (ref 8.9–10.3)
Chloride: 105 mmol/L (ref 98–111)
Creatinine, Ser: 1.18 mg/dL (ref 0.61–1.24)
GFR, Estimated: >60 mL/min (ref >60)
Glucose, Bld: 86 mg/dL (ref 70–99)
Potassium: 3.3 mmol/L — ABNORMAL LOW (ref 3.5–5.1)
Sodium: 145 mmol/L (ref 135–145)

## 2022-07-29 LAB — CBG MONITORING, ED
Glucose-Capillary: 82 mg/dL (ref 70–99)
Glucose-Capillary: 93 mg/dL (ref 70–99)

## 2022-07-29 LAB — ETHANOL
Alcohol, Ethyl (B): 347 mg/dL (ref <10)
Alcohol, Ethyl (B): 492 mg/dL (ref <10)

## 2022-07-29 LAB — BRAIN NATRIURETIC PEPTIDE: B Natriuretic Peptide: 4.6 pg/mL (ref 0.0–100.0)

## 2022-07-29 MED ORDER — LORAZEPAM 2 MG/ML IJ SOLN: 0.0000 mg | Freq: Four times a day (QID) | INTRAMUSCULAR | Status: DC

## 2022-07-29 MED ORDER — LORAZEPAM 2 MG/ML IJ SOLN: 0.0000 mg | Freq: Two times a day (BID) | INTRAMUSCULAR | Status: DC

## 2022-07-29 MED ORDER — LORAZEPAM 2 MG PO TABS: 0.0000 mg | ORAL_TABLET | Freq: Two times a day (BID) | ORAL | Status: DC

## 2022-07-29 MED ORDER — THIAMINE HCL 100 MG/ML IJ SOLN
100.0000 mg | Freq: Every day | INTRAMUSCULAR | Status: DC
  Administered 2022-07-29: 100 mg via INTRAVENOUS
  Filled 2022-07-29: qty 2

## 2022-07-29 MED ORDER — LORAZEPAM 2 MG PO TABS: 0.0000 mg | ORAL_TABLET | Freq: Four times a day (QID) | ORAL | Status: DC

## 2022-07-29 MED ORDER — THIAMINE MONONITRATE 100 MG PO TABS: 100.0000 mg | ORAL_TABLET | Freq: Every day | ORAL | Status: DC

## 2022-07-29 NOTE — ED Notes (Signed)
Pt sleeping at this time.
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
## 2022-07-29 NOTE — ED Notes (Signed)
C collar applied to pt, he had fallen prior to arrival and hit his head. He is c/o a headache. Pt is alert and oriented x3, GCS of 15. Vitals stable. He has a moderate sized abrasion to the left side of his face. 02 was stable on room air at 98%, he is on supplemental 02 at this time. Resp rate is 12, they are even and non labored. Pupils reactive to light. IV access 20G in right AC. MD aware of pts condition.

## 2022-07-29 NOTE — ED Notes (Signed)
Urine obtained and sent to lab. CIWA 4, pt scored because he has a headache.

## 2022-07-29 NOTE — ED Notes (Signed)
Pt not in room and is at CT

## 2022-07-29 NOTE — ED Notes (Signed)
Pt given a [place] sandwich tray

## 2022-07-29 NOTE — ED Notes (Signed)
Dr. Marisa Severin informed of pts alc level of 492

## 2022-07-29 NOTE — ED Provider Notes (Signed)
[facility] Provider Note    Event Date/Time   First MD Initiated Contact with Patient 07/29/22 [id number]     (approximate)   History   Alcohol Intoxication   HPI  Joshua Medina is a 36 y.o. male who presents to the ED for evaluation of Alcohol Intoxication   I reviewed recurrent ED visits for his alcoholism and associated complications.  I am familiar with this patient.  He presents to the ED "because I am drunk... Actually, it is because I am homeless."  He reports falling 2 or 3 days ago and striking the left side of his face "because I was drunk."  He is requesting some food.  No other recent illnesses, events or injuries.   Physical Exam   Triage Vital Signs: ED Triage Vitals  Enc Vitals Group     BP 07/28/22 2032 109/73     Pulse Rate 07/28/22 2032 82     Resp 07/28/22 2032 16     Temp 07/28/22 2032 98 F (36.7 C)     Temp Source 07/28/22 2032 Oral     SpO2 07/28/22 2032 100 %     Weight 07/28/22 2032 158 lb (71.7 kg)     Height 07/28/22 2032 5\' 10"  (1.778 m)     Head Circumference --      Peak Flow --      Pain Score 07/28/22 2038 5     Pain Loc --      Pain Edu? --      Excl. in [place]? --     Most recent vital signs: Vitals:   07/28/22 2032  BP: 109/73  Pulse: 82  Resp: 16  Temp: 98 F (36.7 C)  SpO2: 100%    General: Awake, no distress.  CV:  Good peripheral perfusion.  Resp:  Normal effort.  Abd:  No distention.  MSK:  No deformity noted.  Neuro:  No focal deficits appreciated. Cranial nerves II through XII intact 5/5 strength and sensation in all 4 extremities Other:  Small amount of left-sided periorbital swelling and bruising over the left maxilla just inferior to the orbit.  No signs of EOM entrapment, laceration or open injury.  No bony step-offs.   ED Results / Procedures / Treatments   Labs (all labs ordered are listed, but only abnormal results are displayed) Labs Reviewed  COMPREHENSIVE METABOLIC PANEL -  Abnormal; Notable for the following components:      Result Value   Total Protein 8.2 (*)    AST 57 (*)    All other components within normal limits  ETHANOL - Abnormal; Notable for the following components:   Alcohol, Ethyl (B) 347 (*)    All other components within normal limits  CBC - Abnormal; Notable for the following components:   MCV 102.1 (*)    Platelets 127 (*)    All other components within normal limits  URINE DRUG SCREEN, QUALITATIVE ([facility] ONLY)    EKG   RADIOLOGY CT head interpreted by me without evidence of acute intracranial pathology CT cervical spine interpreted by me without evidence of fracture dislocation. CT maxillofacial interpreted by me without evidence of fracture or dislocation.  Official radiology report(s): CT Maxillofacial Wo Contrast  Result Date: 07/28/2022 CLINICAL DATA:  [place], left orbital bruising, alcohol abuse EXAM: CT MAXILLOFACIAL WITHOUT CONTRAST TECHNIQUE: Multidetector CT imaging of the maxillofacial structures was performed. Multiplanar CT image reconstructions were also generated. RADIATION DOSE REDUCTION: This exam was performed according to the  departmental dose-optimization program which includes automated exposure control, adjustment of the mA and/or kV according to patient size and/or use of iterative reconstruction technique. COMPARISON:  None Available. FINDINGS: Osseous: No fracture or mandibular dislocation. No destructive process. Prominent degenerative changes of the bilateral temporomandibular joints, right greater than left. Orbits: Negative. No traumatic or inflammatory finding. Sinuses: Mild mucoperiosteal thickening within the left maxillary sinus. Remaining paranasal sinuses are clear. Soft tissues: Mild left infraorbital soft tissue swelling. Remaining soft tissues are unremarkable. Limited intracranial: No significant or unexpected finding. IMPRESSION: 1. Left infraorbital soft tissue swelling. 2. No acute displaced fracture.  Electronically Signed   By: Sharlet Salina M.D.   On: 07/28/2022 21:18   CT Cervical Spine Wo Contrast  Result Date: 07/28/2022 CLINICAL DATA:  [place] yesterday, intoxicated, left orbital bruising EXAM: CT CERVICAL SPINE WITHOUT CONTRAST TECHNIQUE: Multidetector CT imaging of the cervical spine was performed without intravenous contrast. Multiplanar CT image reconstructions were also generated. RADIATION DOSE REDUCTION: This exam was performed according to the departmental dose-optimization program which includes automated exposure control, adjustment of the mA and/or kV according to patient size and/or use of iterative reconstruction technique. COMPARISON:  None Available. FINDINGS: Alignment: Straightening of the cervical spine is likely positional. Otherwise alignment is anatomic. Skull base and vertebrae: No acute fracture. No primary bone lesion or focal pathologic process. Soft tissues and spinal canal: No prevertebral fluid or swelling. No visible canal hematoma. Disc levels:  No significant spondylosis or facet hypertrophy. Upper chest: Airway is patent. Emphysematous changes at the lung apices. Other: Reconstructed images demonstrate no additional findings. IMPRESSION: 1. No acute cervical spine fracture. Electronically Signed   By: Sharlet Salina M.D.   On: 07/28/2022 21:16   CT HEAD WO CONTRAST ( )  Result Date: 07/28/2022 CLINICAL DATA:  Intoxicated, fell yesterday, left eye bruising EXAM: CT HEAD WITHOUT CONTRAST TECHNIQUE: Contiguous axial images were obtained from the base of the skull through the vertex without intravenous contrast. RADIATION DOSE REDUCTION: This exam was performed according to the departmental dose-optimization program which includes automated exposure control, adjustment of the mA and/or kV according to patient size and/or use of iterative reconstruction technique. COMPARISON:  04/08/2022 FINDINGS: Brain: Stable diffuse cerebral atrophy. No acute infarct or hemorrhage.  Lateral ventricles and midline structures are stable. No acute extra-axial fluid collections. No mass effect. Vascular: No hyperdense vessel or unexpected calcification. Skull: Normal. Negative for fracture or focal lesion. Sinuses/Orbits: No acute finding. Other: None. IMPRESSION: 1. Stable head CT, no acute intracranial process. Electronically Signed   By: Sharlet Salina M.D.   On: 07/28/2022 21:14    PROCEDURES and INTERVENTIONS:  Procedures  Medications - No data to display   IMPRESSION / MDM / ASSESSMENT AND PLAN / ED COURSE  I reviewed the triage vital signs and the nursing notes.  Differential diagnosis includes, but is not limited to, malingering, intracranial hemorrhage, orbital fracture, Warnicke's encephalopathy  {Patient presents with symptoms of an acute illness or injury that is potentially life-threatening.  36 year old alcoholic presents after falling a couple days ago.  Some bruising around his face, but no other signs of traumatic pathology.  No signs of impaired range of motion or trauma to the extremities.  No signs of neurologic or vascular deficits.  CT of the head, face and neck are reassuring.  Serum workup about 8 hours ago with elevated ethanol level, but no significant derangements to his metabolic panel or hematologic panel.  Will provide him some food, he has sobered up and  he is suitable for outpatient management.      FINAL CLINICAL IMPRESSION(S) / ED DIAGNOSES   Final diagnoses:  Alcohol abuse  Homeless  Fall, initial encounter     Rx / DC Orders   ED Discharge Orders     None        Note:  This document was prepared using Dragon voice recognition software and may include unintentional dictation errors.   Vladimir Crofts, MD 07/29/22 [phone]

## 2022-07-30 ENCOUNTER — Emergency Department
Admission: EM | Admit: 2022-07-30 | Discharge: 2022-07-30 | Disposition: A | Discharge status: Home / Self Care | Payer: BLUE CROSS/BLUE SHIELD | Attending: Emergency Medicine | Admitting: Emergency Medicine

## 2022-07-30 ENCOUNTER — Other Ambulatory Visit: Payer: Self-pay

## 2022-07-30 DIAGNOSIS — F10129 Alcohol abuse with intoxication, unspecified: Secondary | ICD-10-CM | POA: Diagnosis present

## 2022-07-30 DIAGNOSIS — F1010 Alcohol abuse, uncomplicated: Secondary | ICD-10-CM

## 2022-07-30 MED ORDER — CHLORDIAZEPOXIDE HCL 25 MG PO CAPS
50.0000 mg | ORAL_CAPSULE | Freq: Once | ORAL | Status: AC
  Administered 2022-07-30: 50 mg via ORAL
  Filled 2022-07-30: qty 2

## 2022-07-30 MED ORDER — ACETAMINOPHEN 325 MG PO TABS
650.0000 mg | ORAL_TABLET | Freq: Once | ORAL | Status: AC
  Administered 2022-07-30: 650 mg via ORAL
  Filled 2022-07-30: qty 2

## 2022-07-30 NOTE — ED Notes (Signed)
Pt placed in hallway bed and rolled over to go to sleep.

## 2022-07-30 NOTE — ED Notes (Signed)
Pt ambulatory to bathroom with steady gait.

## 2022-07-30 NOTE — ED Provider Notes (Signed)
[facility] Provider Note    Event Date/Time   First MD Initiated Contact with Patient 07/30/22 1808     (approximate)   History   Alcohol Intoxication   HPI  Joshua Medina is a 36 y.o. male with a history of alcohol abuse who presents with alcohol intoxication.  The patient was found outside of a [employer] after drinking.  He endorses drinking alcohol today.  He denies any drug use.  He has no complaints.  The patient was just seen in the ED late yesterday evening for alcohol intoxication.  And discharged early this morning.  He was evaluate by psychiatry on 10/2 but did not meet inpatient psychiatric criteria at that time.  He has had numerous subsequent ED visits for intoxication.   Physical Exam   Triage Vital Signs: ED Triage Vitals  Enc Vitals Group     BP 07/30/22 1809 95/72     Pulse Rate 07/30/22 1809 88     Resp 07/30/22 1809 18     Temp 07/30/22 1809 98 F (36.7 C)     Temp Source 07/30/22 1809 Oral     SpO2 07/30/22 1809 98 %     Weight 07/30/22 1810 164 lb 14.5 oz (74.8 kg)     Height 07/30/22 1810 5\' 10"  (1.778 m)     Head Circumference --      Peak Flow --      Pain Score 07/30/22 1809 0     Pain Loc --      Pain Edu? --      Excl. in [place]? --     Most recent vital signs: Vitals:   07/30/22 1809  BP: 95/72  Pulse: 88  Resp: 18  Temp: 98 F (36.7 C)  SpO2: 98%     General: Sleepy but arousable, intoxicated. CV:  Good peripheral perfusion.  Resp:  Normal effort.  Abd:  No distention.  Other:  EOMI.  PERRLA.  Motor intact in all extremities.  No visible trauma.   ED Results / Procedures / Treatments   Labs (all labs ordered are listed, but only abnormal results are displayed) Labs Reviewed - No data to display   EKG     RADIOLOGY     PROCEDURES:  Critical Care performed: No  Procedures   MEDICATIONS ORDERED IN ED: Medications  chlordiazePOXIDE (LIBRIUM) capsule 50 mg (50 mg Oral Given 07/30/22  2042)  acetaminophen (TYLENOL) tablet 650 mg (650 mg Oral Given 07/30/22 2042)     IMPRESSION / MDM / ASSESSMENT AND PLAN / ED COURSE  I reviewed the triage vital signs and the nursing notes.  36 year old male with a history of alcohol abuse presents with alcohol intoxication.  He was just seen in the ED last night for the same and discharged this morning.  He denies any acute complaints.  He is intoxicated appearing but able to answer questions and follow commands appropriately.  There is no visible trauma.  Neurologic exam is nonfocal.  His vital signs are normal except for borderline low BP.  Differential diagnosis includes, but is not limited to, alcohol intoxication, abuse.  There is no evidence of withdrawal.  At this time there is no indication for lab workup.  We will observe to sobriety.  Patient's presentation is most consistent with exacerbation of chronic illness.  ----------------------------------------- 9:09 PM on 07/30/2022 -----------------------------------------  Patient is alert and oriented, walking with steady gait, and clinically sober.  He is not demonstrating signs of withdrawal  but I ordered a dose of Librium to prevent it.  He is stable for discharge at this time.  I provided outpatient substance abuse resources.  I gave him return precautions and he expressed understanding.    FINAL CLINICAL IMPRESSION(S) / ED DIAGNOSES   Final diagnoses:  Alcohol abuse     Rx / DC Orders   ED Discharge Orders     None        Note:  This document was prepared using Dragon voice recognition software and may include unintentional dictation errors.    Dionne Bucy, MD 07/30/22 [phone]

## 2022-07-30 NOTE — ED Triage Notes (Signed)
Pt to [facility] via [facility] from [facility] in [place] [address]. Staff of [employer] called EMS for pt. Pt ambulated to unit with no issues. Pt is delayed with reactions and responses. Pt has been drinking ETOH. Pt was just discharged this morning around 0745am.  Vitals for EMS : 105/63 86 HR  99% on RA 222 BGL

## 2022-07-30 NOTE — ED Notes (Signed)
Assumed care of patient.  Patient resting with eyes closed at this time.  No acute distress noted.

## 2022-07-30 NOTE — ED Provider Notes (Signed)
Emergency Medicine Observation Re-evaluation Note  Joshua Medina is a 36 y.o. male, seen on rounds today.  Physical Exam  BP 100/65   Pulse 67   Temp (!) 97.5 F (36.4 C) (Oral)   Resp 11   Ht 5\' 10"  (1.778 m)   Wt 74.8 kg   SpO2 100%   BMI 23.68 kg/m  Physical Exam General: Patient resting comfortably in bed Lungs: Patient not in respiratory distress Psych: Patient not combative  ED Course / MDM  EKG:     Plan  Current plan is for discharge later this morning when he wakes up and is sober.    , MD 07/30/22 [phone]

## 2022-07-30 NOTE — ED Notes (Signed)
Signature pad not working, patient verbalized understanding.

## 2022-07-30 NOTE — ED Notes (Signed)
Pt awake - pt given graham crackers and orange juice. IV taken out at this time

## 2022-07-30 NOTE — Discharge Instructions (Signed)
You were seen in the emergency department for alcohol intoxication.  Please seek help from the recommended resources for assistance with your alcohol dependence.  If you have any thoughts of hurting herself or others, please call 911 or return to the emergency department.  Please avoid drug and alcohol use.  Never drive a vehicle or operate machinery while intoxicated.  Please go to the following website to schedule new (and existing) patient appointments:   [email or web address]   The following is a list of primary care offices in the area who are accepting new patients at this time.  Please reach out to one of them directly and let them know you would like to schedule an appointment to follow up on an Emergency Department visit, and/or to establish a new primary care provider (PCP).  There are likely other primary care clinics in the are who are accepting new patients, but this is an excellent place to start:  [facility] Lead physician: Dr Shirlee Latch [address] [place], [place] [address] [phone]  [facility] Lead Physician: Dr Alba  [address], [place], [place] [address] [phone]  [facility]  Lead Physician: Dr Olevia Perches [address] [place], [place] [address] [phone]  [facility] Lead Physician: Dr Sofie Hartigan [address], [place], [place] [address] [phone]  [facility] Primary Care & Sports Medicine at [facility] Lead Physician: Dr Bari Edward [address] [place], [place], [place] [address] [phone]

## 2022-08-10 NOTE — Congregational Nurse Program (Signed)
  Dept: [phone]   Congregational Nurse Program Note  Date of Encounter: 08/10/2022 Client to [facility] with questions about insurance mailings he had gotten. Client and 2 different cards and was confused on their meaning. RN checked with the [facility] financial Navigator to ascertain which insurance was in effect. He currently has [place] [employer] health Medicaid, which was verified. Client does not have a PCP, Rn has offered several times to assist him with this, however he continues to decline. Rn encouraged client to return to clinic for further assistance when he was ready, he voiced understanding. RN will continue to support and encourage his participation in this. Past Medical History: Past Medical History:  Diagnosis Date   Diabetes mellitus without complication (HCC)    Graves disease    Thyroid disease     Encounter Details:  CNP Questionnaire - 08/10/22 1100       Questionnaire   Ask client: Do you give verbal consent for me to treat you today? Yes    Student Assistance N/A    Location Patient Served  [facility]    Visit Setting with Client Organization    Patient Status Unhoused    Engineer, building services or [place] Insurance   client has cards for [employer] health and [place]   Insurance/Financial Assistance Referral Affordable Care (ACA)    Medication N/A   client is  not taking any medications at this time.   Medical Provider No   [facility] but [facility]. He now has private insurance through the affordable care act. Does not have a PCP   Screening Referrals Made N/A    Medical Referrals Made N/A    Medical Appointment Made N/A    Recently w/o PCP, now 1st time PCP visit completed due to CNs referral or appointment made N/A    Food Have Food Insecurities   client is unhoused, eats at [facility] and other places, is aware of Art therapist N/A   client uses the [employer] bus at this time    Housing/Utilities No permanent housing    Economist N/A   client is homeless and sleeping in a car or outside   Interventions Advocate/Support;Case Management    Abnormal to Normal Screening Since Last CN Visit N/A    Screenings CN Performed N/A    Sent Client to Lab for: N/A    Did client attend any of the following based off CNs referral or appointments made? N/A    ED Visit Averted N/A    Life-Saving Intervention Made N/A

## 2022-08-22 NOTE — Congregational Nurse Program (Signed)
  Dept: [phone]   Congregational Nurse Program Note  Date of Encounter: 08/22/2022 Client to [facility] Day center with request for nail care. No other needs at this time. RN to continue the conversation regarding a health care provider and client does have insurance coverage. Past Medical History: Past Medical History:  Diagnosis Date   Diabetes mellitus without complication ([place])    Graves disease    Thyroid disease     Encounter Details:  CNP Questionnaire - 08/22/22 1135       Questionnaire   Ask client: Do you give verbal consent for me to treat you today? Yes    Student Assistance N/A    Location Patient [place]    Visit Setting with Client Organization    Patient Status Research officer, political party or [place] Insurance;Medicaid   client has cards for [employer] health and [place] complete   Insurance/Financial Assistance Referral Affordable Care (ACA);Medicaid   RN clarifying correct coverage with [place] finacial navigator   Medication N/A   client is  not taking any medications at this time.   Medical Provider No   [facility], but [facility] not [facility]. He now has private insurance through the affordable care act. Does not have a PCP   Screening Referrals Made N/A    Medical Referrals Made N/A    Medical Appointment Made N/A    Recently w/o PCP, now 1st time PCP visit completed due to CNs referral or appointment made N/A    Food Have Food Insecurities   client is unhoused, eats at [facility] and other places, is aware of Electrical engineer N/A   client uses the [employer] bus at this time   Housing/Utilities No permanent housing    Chiropractor N/A   client is homeless and sleeping in a car or outside   Interventions Advocate/Support;Case Management    Abnormal to Normal Screening Since Last CN Visit N/A    Screenings CN Performed N/A    Sent Client to Lab for: N/A    Did client attend any of the  following based off CNs referral or appointments made? N/A    ED Visit Averted N/A    Life-Saving Intervention Made N/A

## 2022-09-28 ENCOUNTER — Other Ambulatory Visit: Payer: Self-pay

## 2022-09-28 ENCOUNTER — Emergency Department
Admission: EM | Admit: 2022-09-28 | Discharge: 2022-09-29 | Disposition: A | Discharge status: Home / Self Care | Payer: No Typology Code available for payment source | Attending: Emergency Medicine | Admitting: Emergency Medicine

## 2022-09-28 DIAGNOSIS — Y908 Blood alcohol level of 240 mg/100 ml or more: Secondary | ICD-10-CM | POA: Insufficient documentation

## 2022-09-28 DIAGNOSIS — F10120 Alcohol abuse with intoxication, uncomplicated: Secondary | ICD-10-CM | POA: Diagnosis not present

## 2022-09-28 DIAGNOSIS — E119 Type 2 diabetes mellitus without complications: Secondary | ICD-10-CM | POA: Insufficient documentation

## 2022-09-28 DIAGNOSIS — F10920 Alcohol use, unspecified with intoxication, uncomplicated: Secondary | ICD-10-CM

## 2022-09-28 LAB — CBC
HCT: 43.3 % (ref 39.0–52.0)
Hemoglobin: 14.2 g/dL (ref 13.0–17.0)
MCH: 32.3 pg (ref 26.0–34.0)
MCHC: 32.8 g/dL (ref 30.0–36.0)
MCV: 98.4 fL (ref 80.0–100.0)
Platelets: 149 10*3/uL — ABNORMAL LOW (ref 150–400)
RBC: 4.4 MIL/uL (ref 4.22–5.81)
RDW: 14.5 % (ref 11.5–15.5)
WBC: 9.5 10*3/uL (ref 4.0–10.5)
nRBC: 0 % (ref 0.0–0.2)

## 2022-09-28 LAB — COMPREHENSIVE METABOLIC PANEL
ALT: 49 U/L — ABNORMAL HIGH (ref 0–44)
AST: 109 U/L — ABNORMAL HIGH (ref 15–41)
Albumin: 4.8 g/dL (ref 3.5–5.0)
Alkaline Phosphatase: 101 U/L (ref 38–126)
Anion gap: 10 (ref 5–15)
BUN: 15 mg/dL (ref 6–20)
CO2: 28 mmol/L (ref 22–32)
Calcium: 8.9 mg/dL (ref 8.9–10.3)
Chloride: 104 mmol/L (ref 98–111)
Creatinine, Ser: 1.26 mg/dL — ABNORMAL HIGH (ref 0.61–1.24)
GFR, Estimated: >60 mL/min (ref >60)
Glucose, Bld: 86 mg/dL (ref 70–99)
Potassium: 3.5 mmol/L (ref 3.5–5.1)
Sodium: 142 mmol/L (ref 135–145)
Total Bilirubin: 0.7 mg/dL (ref 0.3–1.2)
Total Protein: 7.9 g/dL (ref 6.5–8.1)

## 2022-09-28 LAB — ETHANOL: Alcohol, Ethyl (B): 415 mg/dL (ref <10)

## 2022-09-28 NOTE — ED Notes (Signed)
Pt from [facility], no complaints to this nurse

## 2022-09-28 NOTE — ED Triage Notes (Signed)
EMS brings pt in from [facility] (homeless); +ETOH; c/o left upper quad pain after falling 3 days ago

## 2022-09-28 NOTE — ED Triage Notes (Signed)
Arrives EMS from [facility]. Alcohol intoxication.

## 2022-09-28 NOTE — ED Provider Notes (Signed)
   [facility] Provider Note    None    (approximate)  History   Chief Complaint: Alcohol Intoxication  HPI  Joshua Medina is a 37 y.o. male with a past medical history of diabetes who presents to the emergency department for alcohol intoxication.  Patient admits to alcohol intoxication but does not know why he is here or who brought him.  Patient has no medical complaints.  EMS reports that they were called to the [facility] for alcohol intoxication at that time patient was stating abdominal pain although patient denies any pain to myself.  Patient is homeless.  Physical Exam   Triage Vital Signs: ED Triage Vitals  Enc Vitals Group     BP 09/28/22 2043 104/79     Pulse Rate 09/28/22 2043 75     Resp 09/28/22 2043 18     Temp 09/28/22 2043 98.3 F (36.8 C)     Temp Source 09/28/22 2043 Oral     SpO2 09/28/22 2037 99 %     Weight 09/28/22 2044 180 lb (81.6 kg)     Height 09/28/22 2044 5\' 10"  (1.778 m)     Head Circumference --      Peak Flow --      Pain Score 09/28/22 2049 4     Pain Loc --      Pain Edu? --      Excl. in [place]? --     Most recent vital signs: Vitals:   09/28/22 2037 09/28/22 2043  BP:  104/79  Pulse:  75  Resp:  18  Temp:  98.3 F (36.8 C)  SpO2: 99% 100%    General: Patient sleeping, does awaken to mild physical stimuli.  Admits to drinking alcohol but denies any other complaints.  Had a largely negative review of systems.  Falls back asleep if not actively engaged.  Patient states he does not have anyone to pick him up tonight and is homeless. CV:  Good peripheral perfusion.  Regular rate and rhythm  Resp:  Normal effort.  Equal breath sounds bilaterally.  Abd:  No distention.  Soft, nontender.  No rebound or guarding.  ED Results / Procedures / Treatments    MEDICATIONS ORDERED IN ED: Medications - No data to display   IMPRESSION / MDM / [place] / ED COURSE  I reviewed the triage vital signs and the  nursing notes.  Patient's presentation is most consistent with acute illness / injury with system symptoms.  Patient presents emergency department for alcohol intoxication brought by EMS from [facility] [facility].  Patient admits to alcohol intoxication also states he is homeless does not want to come pick him up.  Patient denies any medical complaints.  Has a negative review of systems.  Will check labs and continue to closely monitor.  Given no complaints we will likely discharge once clinically sober or the patient has a ride.  FINAL CLINICAL IMPRESSION(S) / ED DIAGNOSES   Alcohol intoxication   Note:  This document was prepared using Dragon voice recognition software and may include unintentional dictation errors.   Harvest Dark, MD 09/28/22 2215

## 2022-09-28 NOTE — Congregational Nurse Program (Signed)
  Dept: [facility] Nurse Program Note  Date of Encounter: 09/28/2022 Client into nurse only clinic requesting assistance cleaning facial abrasions from falling on sidewalk after drinking 2 days ago. Presented with open, healing wound to forehead covered with bandaid, clean and dry. Dried blood to bridge of nose below an abrasion that has scab covering. 3rd abrasion on left cheek with scab. Assisted client cleaning face and covering open area. Counseled re self care and provided bandaids as client doesn't have a home. Discussed status with drinking; client declined further interventions at this time. Client frequents [facility] and will continue follow up with staff.  Rhermann, RN  Past Medical History: Past Medical History:  Diagnosis Date   Diabetes mellitus without complication ([place])    Graves disease    Thyroid disease     Encounter Details:  CNP Questionnaire - 09/28/22 1045       Questionnaire   Ask client: Do you give verbal consent for me to treat you today? Yes    Student Assistance N/A    Location Patient [place]    Visit Setting with Client Organization    Patient Status Research officer, political party or [place] Insurance;Medicaid   client has cards for [employer] health and [place] complete   Insurance/Financial Assistance Referral Affordable Care (ACA);Medicaid   RN clarifying correct coverage with [place] finacial navigator   Medication N/A   client is  not taking any medications at this time.   Medical Provider No   [facility]. He now has private insurance through the affordable care act. Does not have a PCP   Screening Referrals Made N/A    Medical Referrals Made N/A    Medical Appointment Made N/A    Recently w/o PCP, now 1st time PCP visit completed due to CNs referral or appointment made N/A    Food Have Food Insecurities   client is unhoused, eats at [facility] and  other places, is aware of Electrical engineer N/A   client uses the [employer] bus at this time   Housing/Utilities No permanent housing    Chiropractor N/A   client is homeless and sleeping in a car or outside   Interventions Advocate/Support;Educate    Abnormal to Normal Screening Since Last CN Visit N/A    Screenings CN Performed N/A    Sent Client to Lab for: N/A    Did client attend any of the following based off CNs referral or appointments made? N/A    ED Visit Averted N/A    Life-Saving Intervention Made N/A

## 2022-09-29 NOTE — ED Provider Notes (Signed)
-----------------------------------------   6:36 AM on 09/29/2022 -----------------------------------------   No events overnight.  Patient remains asleep in no acute distress.  Anticipate discharge home once patient is sober and ambulatory with steady gait.  Care will be transferred to the oncoming provider.   Paulette Blanch, MD 09/29/22 [phone]

## 2022-09-29 NOTE — ED Provider Notes (Signed)
Procedures     ----------------------------------------- 9:22 AM on 09/29/2022 -----------------------------------------   Blood pressure 104/79, pulse 75, temperature 98.3 F (36.8 C), temperature source Oral, resp. rate 18, height 5' 10"$  (1.778 m), weight 81.6 kg, SpO2 100 %. Physical Exam General: NAD.  Awake, clear speech, tolerating p.o., steady gait. Lungs: CTAB Psych: not agitated    I have reviewed the labs performed to date as well as medications administered while in observation.  Recent changes in the last 24 hours include no acute events overnight.  Now clinically sober.  Declines detox/rehab resources.  Final diagnoses:  Alcoholic intoxication without complication [facility])      Carrie Mew, MD 09/29/22 [phone]

## 2022-09-29 NOTE — ED Notes (Signed)
Pt taking bus home, verified appropriateness with EDP, Joni Fears. Pt left with all of belongings. Pt given breakfast tray. Pt informed of bus route that will [place] to [place], where he has requested to go. Verified correct patient and correct discharge papers given. Pt alert and oriented X 4, stable for discharge. RR even and unlabored, color WNL. Discussed discharge instructions and follow-up as directed. Discharge medications discussed, when prescribed. Pt had opportunity to ask questions, and RN available to provide patient and/or family education.

## 2022-09-29 NOTE — ED Notes (Signed)
Pt awake, alert and able to ambulate safely to and from bathroom. Pt able to eat and drink without emesis. EDP at bedside to re-assess for appropriateness of discharge.

## 2022-10-01 ENCOUNTER — Other Ambulatory Visit: Payer: Self-pay

## 2022-10-01 ENCOUNTER — Emergency Department
Admission: EM | Admit: 2022-10-01 | Discharge: 2022-10-01 | Disposition: A | Discharge status: Home / Self Care | Payer: Medicaid Other | Attending: Emergency Medicine | Admitting: Emergency Medicine

## 2022-10-01 ENCOUNTER — Emergency Department: Payer: Medicaid Other

## 2022-10-01 DIAGNOSIS — S0081XA Abrasion of other part of head, initial encounter: Secondary | ICD-10-CM | POA: Insufficient documentation

## 2022-10-01 DIAGNOSIS — R519 Headache, unspecified: Secondary | ICD-10-CM | POA: Insufficient documentation

## 2022-10-01 DIAGNOSIS — S0990XA Unspecified injury of head, initial encounter: Secondary | ICD-10-CM | POA: Diagnosis not present

## 2022-10-01 DIAGNOSIS — W19XXXA Unspecified fall, initial encounter: Secondary | ICD-10-CM | POA: Insufficient documentation

## 2022-10-01 DIAGNOSIS — R0781 Pleurodynia: Secondary | ICD-10-CM | POA: Diagnosis not present

## 2022-10-01 DIAGNOSIS — S2232XA Fracture of one rib, left side, initial encounter for closed fracture: Secondary | ICD-10-CM | POA: Diagnosis not present

## 2022-10-01 DIAGNOSIS — R0789 Other chest pain: Secondary | ICD-10-CM | POA: Diagnosis present

## 2022-10-01 MED ORDER — HYDROCODONE-ACETAMINOPHEN 5-325 MG PO TABS
1.0000 | ORAL_TABLET | ORAL | Status: AC
  Administered 2022-10-01: 1 via ORAL
  Filled 2022-10-01: qty 1

## 2022-10-01 MED ORDER — HYDROCODONE-ACETAMINOPHEN 5-325 MG PO TABS: 1.0000 | ORAL_TABLET | Freq: Four times a day (QID) | ORAL | 0 refills | Status: DC | PRN

## 2022-10-01 MED ORDER — LIDOCAINE 5 % EX PTCH
1.0000 | MEDICATED_PATCH | CUTANEOUS | Status: DC
  Administered 2022-10-01: 1 via TRANSDERMAL
  Filled 2022-10-01: qty 1

## 2022-10-01 NOTE — Discharge Instructions (Signed)
Please apply ice 20 minutes every hour to the left ribs.  Use over-the-counter medications as needed for mild to moderate pain.  Use Norco as needed for severe pain only.  Return to the ER for any chest pain shortness of breath worsening symptoms or any urgent changes in your health.

## 2022-10-01 NOTE — ED Provider Notes (Signed)
[facility] Provider Note   CSN: [id number] Arrival date & time: 10/01/22  1959     History  No chief complaint on file.   Joshua Medina is a 37 y.o. male.  With history of alcohol intoxication presents to the emergency department for evaluation of left-sided rib pain.  Patient was intoxicated on 09/28/2021, fell and injured his left ribs.  He also states he thinks he hit his head, has an abrasion to the forehead.  He is complaining of mild headache.  Denies any vision changes, nausea, vomiting.  No neck pain numbness tingling radicular symptoms.  No shortness of breath abdominal pain or back pain.  No numbness or tingling the lower extremities.  HPI     Home Medications Prior to Admission medications   Medication Sig Start Date End Date Taking? Authorizing Provider  HYDROcodone-acetaminophen (NORCO) 5-325 MG tablet Take 1 tablet by mouth every 6 (six) hours as needed for moderate pain. 10/01/22  Yes Duanne Guess, PA-C      Allergies    Precedex [dexmedetomidine hcl in nacl]    Review of Systems   Review of Systems  Physical Exam Updated Vital Signs BP 114/88 (BP Location: Left Arm)   Pulse 89   Temp 97.8 F (36.6 C) (Oral)   Resp 16   Ht 5' 10"$  (1.778 m)   Wt 81.6 kg   SpO2 98%   BMI 25.81 kg/m  Physical Exam Constitutional:      General: He is not in acute distress.    Appearance: He is well-developed.  HENT:     Head: Normocephalic.     Jaw: No trismus.     Comments: Abrasion left forehead, appears to be subacute.  No swelling or tenderness    Right Ear: Hearing, tympanic membrane, ear canal and external ear normal.     Left Ear: Hearing, tympanic membrane, ear canal and external ear normal.     Nose: No congestion or rhinorrhea.     Right Sinus: No maxillary sinus tenderness or frontal sinus tenderness.     Left Sinus: No maxillary sinus tenderness or frontal sinus tenderness.     Mouth/Throat:     Pharynx: No  oropharyngeal exudate, posterior oropharyngeal erythema or uvula swelling.     Tonsils: No tonsillar abscesses.  Eyes:     Conjunctiva/sclera: Conjunctivae normal.  Cardiovascular:     Rate and Rhythm: Normal rate and regular rhythm.  Pulmonary:     Effort: Pulmonary effort is normal. No respiratory distress.     Breath sounds: No stridor. No wheezing.     Comments: Left rib tenderness, no bruising Chest:     Chest wall: No tenderness.  Abdominal:     General: Abdomen is flat. There is no distension.     Palpations: Abdomen is soft.     Tenderness: There is no abdominal tenderness. There is no right CVA tenderness or guarding.  Musculoskeletal:        General: Normal range of motion.     Cervical back: Normal range of motion.     Comments: No cervical thoracic or lumbar spinous process tenderness.  Skin:    General: Skin is warm and dry.     Findings: No rash.  Neurological:     General: No focal deficit present.     Mental Status: He is alert and oriented to person, place, and time. Mental status is at baseline.     Cranial Nerves: No cranial nerve  deficit.     Motor: No weakness.     Gait: Gait normal.  Psychiatric:        Behavior: Behavior normal.        Thought Content: Thought content normal.        Judgment: Judgment normal.     ED Results / Procedures / Treatments   Labs (all labs ordered are listed, but only abnormal results are displayed) Labs Reviewed - No data to display  EKG None  Radiology DG Ribs Unilateral W/Chest Left  Result Date: 10/01/2022 CLINICAL DATA:  Rib pain from fall last week. EXAM: LEFT RIBS AND CHEST - 3+ VIEW COMPARISON:  Chest x-ray 01/12/2021. FINDINGS: There is nondisplaced fracture of the anterolateral left eighth rib. Lungs are clear. No pleural effusion or pneumothorax. Cardiomediastinal silhouette is within normal limits. IMPRESSION: Nondisplaced fracture of the anterolateral left eighth rib. Electronically Signed   By: Ronney Asters M.D.   On: 10/01/2022 22:21   CT Head Wo Contrast  Result Date: 10/01/2022 CLINICAL DATA:  Head trauma, moderate-severe head trauma unknown EXAM: CT HEAD WITHOUT CONTRAST TECHNIQUE: Contiguous axial images were obtained from the base of the skull through the vertex without intravenous contrast. RADIATION DOSE REDUCTION: This exam was performed according to the departmental dose-optimization program which includes automated exposure control, adjustment of the mA and/or kV according to patient size and/or use of iterative reconstruction technique. COMPARISON:  CT head 07/29/2022 FINDINGS: Brain: Cerebral ventricle sizes are concordant with the degree of cerebral volume loss. Patchy and confluent areas of decreased attenuation are noted throughout the deep and periventricular white matter of the cerebral hemispheres bilaterally, compatible with chronic microvascular ischemic disease. No evidence of large-territorial acute infarction. No parenchymal hemorrhage. No mass lesion. No extra-axial collection. No mass effect or midline shift. No hydrocephalus. Basilar cisterns are patent. Vascular: No hyperdense vessel. Skull: No acute fracture or focal lesion. Sinuses/Orbits: Paranasal sinuses and mastoid air cells are clear. The orbits are unremarkable. Other: None. IMPRESSION: No acute intracranial abnormality. Electronically Signed   By: Iven Finn M.D.   On: 10/01/2022 21:47    Procedures Procedures    Medications Ordered in ED Medications  HYDROcodone-acetaminophen (NORCO/VICODIN) 5-325 MG per tablet 1 tablet (has no administration in time range)  lidocaine (LIDODERM) 5 % 1 patch (has no administration in time range)    ED Course/ Medical Decision Making/ A&P                             Medical Decision Making Amount and/or Complexity of Data Reviewed Radiology: ordered.   37 year old male complaining of left head injury.  No LOC or nausea or vomiting.  Mild abrasion to the  forehead, no neurological deficits on exam.  Patient with some left rib pain.  CT of the head negative for any acute intracranial process.  X-rays reviewed by me today show nondisplaced left eighth rib fracture.  No acute cardiopulmonary process.  Vital signs are stable.  Patient given Lidoderm patch and Norco for pain.  He is educated on symptomatic treatment at home and he understands signs and symptoms return to the ER for. Final Clinical Impression(s) / ED Diagnoses Final diagnoses:  Closed fracture of one rib of left side, initial encounter  Injury of head, initial encounter    Rx / DC Orders ED Discharge Orders          Ordered    HYDROcodone-acetaminophen (NORCO) 5-325 MG tablet  Every  6 hours PRN        10/01/22 2245              Renata Caprice 10/01/22 2250    Delman Kitten, MD 10/01/22 [phone]

## 2022-10-01 NOTE — ED Triage Notes (Signed)
[facility] Pt fell a few days ago and is now complaining of left sided rib pain. Pt was seen here for same on 2/8. Pt is here today for same. ETOH onboard as well today. Pt did not receive an XRaY on the 8th when he was seen for same. Pt is CAOx4 and in no acute distress.   135/97 99 % RA 91BGL

## 2022-12-18 NOTE — Congregational Nurse Program (Signed)
  Dept: [phone]   Congregational Nurse Program Note  Date of Encounter: 12/18/2022 Client to [facility] day center with request for copy of his ID and SS card. No SS card in his chart. Copy of his ID given. Client reports he "detoxed" himself over the weekend. He reports he "tapered his drinking" on his own. He is interested in getting food stamps as he is not currently receiving them.  [facility] case manager to assist.  BP 98/62 (BP Location: Left Arm, Patient Position: Sitting, Cuff Size: Normal)   Pulse 84   SpO2 99% . Encouraged client to hydrate as much as possible this morning while he is at the center. Also cautioned about low BP and possible light headedness. Client voiced understanding.  Francesco Runner BSN, RN  Past Medical History: Past Medical History:  Diagnosis Date   Diabetes mellitus without complication (HCC)    Graves disease    Thyroid disease     Encounter Details:  CNP Questionnaire - 12/18/22 0946       Questionnaire   Ask client: Do you give verbal consent for me to treat you today? 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   Medical Provider No   [facility] but [facility]. He now has private insurance through the affordable care act. Does not have a PCP   Screening Referrals Made N/A    Medical Referrals Made N/A    Medical Appointment Made N/A    Recently w/o PCP, now 1st time PCP visit completed due to CNs referral or appointment made N/A    Food  Have Food Insecurities   client is unhoused, eats at [facility] and other places, is aware of Art therapist N/A   client uses the [employer] bus at this time   Housing/Utilities No permanent housing    Economist N/A   client is homeless and sleeping in a car or outside   Interventions Advocate/Support;Educate    Abnormal to Normal Screening Since Last CN Visit N/A    Screenings CN Performed Blood Pressure;Pulse Ox    Sent Client to Lab for: N/A    Did client attend any of the following based off CNs referral or appointments made? N/A    ED Visit Averted N/A    Life-Saving Intervention Made N/A

## 2023-01-24 NOTE — Progress Notes (Signed)
This encounter was created in error - please disregard.

## 2023-02-27 NOTE — Congregational Nurse Program (Signed)
  Dept: [phone]   Congregational Nurse Program Note  Date of Encounter: 02/27/2023 Client to [facility] day center with request for assistance in setting up a new PCP. He now has [place] health for both primary and mental health care Telephone call placed to [facility] member services to verify PCP's in network. Telephone c with Dr. Ellsworth Lennox. Apt card written out for client. New insurance card copied and placed in client's chart. Client now has Medicaid transportation. RN to assist in setting up transportation to upcoming apt at next visit as client does not currently have a telephone. Joshua Medina BSN, RN Past Medical History: Past Medical History:  Diagnosis Date   Diabetes mellitus without complication (HCC)    Graves disease    Thyroid disease     Encounter Details:  CNP Questionnaire - 02/27/23 1030       Questionnaire   Ask client: Do you give verbal consent for me to treat you today? Yes    Student Assistance N/A    Location Patient Served  [facility]    Visit Setting with Client Organization    Patient Status Unhoused    Insurance Medicaid   Client has Forensic psychologist health for primary and mental health   Insurance/Financial Assistance Referral N/A   RN clarifying correct coverage with [place] finacial navigator   Medication Have Medication Insecurities   Client stated he had not had his synthroid in several months   Medical Provider No   New patient apt set with [facility]   Screening Referrals Made N/A    [facility]   [facility]   Medical Appointment Made [facility]   [facility]. 7/22 @ 1:00 pm   Recently w/o PCP, now 1st time PCP visit completed due to CNs referral or appointment made N/A    Food Have Food Insecurities   client is unhoused, eats at [facility] and other places, is aware of Art therapist N/A   Client now has Medicaid transportation   Housing/Utilities No permanent housing     Economist N/A   client is homeless and sleeping in a car or outside   Interventions Advocate/Support;Educate;Navigate Healthcare System;Case Management   contact [facility] to confirm providers in network, set up new patient apt   Abnormal to Normal Screening Since Last CN Visit N/A    Screenings CN Performed N/A    Sent Client to Lab for: N/A    Did client attend any of the following based off CNs referral or appointments made? N/A    ED Visit Averted N/A    Life-Saving Intervention Made N/A

## 2023-03-01 NOTE — Congregational Nurse Program (Signed)
  Dept: [phone]   Congregational Nurse Program Note  Date of Encounter: 03/01/2023 Client to [facility] day center with request for assistance in contacting the Housing Navigator at the [employer]. He was able to give her information on 2 apartment complexes that have 1 bedroom apartments available. [place] does not have a phone and cannot get into his email. Jae Dire has the center number and this RN's contact number to call with any information for him. Francesco Runner BSN, RN Past Medical History: Past Medical History:  Diagnosis Date   Diabetes mellitus without complication (HCC)    Graves disease    Thyroid disease     Encounter Details:  CNP Questionnaire - 03/01/23 1238       Questionnaire   Ask client: Do you give verbal consent for me to treat you today? Yes    Student Assistance N/A    Location Patient Served  [facility]    Visit Setting with Client Organization    Patient Status Unhoused    Insurance Medicaid   Client has Forensic psychologist health for primary and mental health   Insurance/Financial Assistance Referral N/A    Medication Have Medication Insecurities   Client stated he had not had his synthroid in several months   Medical Provider No   New patient apt set with [facility]   Screening Referrals Made N/A    Medical Referrals Made [facility]   [facility]   Medical Appointment Made [facility]   [facility]. 7/22 @ 1:00 pm with Dr. Ellsworth Lennox   Recently w/o PCP, now 1st time PCP visit completed due to CNs referral or appointment made N/A    Food Have Food Insecurities   client is unhoused, eats at [facility] and other places, is aware of Art therapist N/A   Client now has Medicaid transportation   Housing/Utilities No permanent housing    Economist N/A   client is homeless and sleeping in a car or outside   Interventions Advocate/Support;Case Management   contact [facility] to confirm  providers in network, set up new patient apt   Abnormal to Normal Screening Since Last CN Visit N/A    Screenings CN Performed N/A    Sent Client to Lab for: N/A    Did client attend any of the following based off CNs referral or appointments made? N/A    ED Visit Averted N/A    Life-Saving Intervention Made N/A

## 2023-03-08 NOTE — Congregational Nurse Program (Signed)
  Dept: [phone]   Congregational Nurse Program Note  Date of Encounter: 03/08/2023 Client to [facility] day center for assistance in setting up his Medicaid transportation for his new patient apt at [employer] on 7/22. Transportation set up. Client has contact information for return transportation. Francesco Runner BSN, RN Past Medical History: Past Medical History:  Diagnosis Date   Diabetes mellitus without complication (HCC)    Graves disease    Thyroid disease     Encounter Details:  CNP Questionnaire - 03/08/23 1035       Questionnaire   Ask client: Do you give verbal consent for me to treat you today? Yes    Student Assistance N/A    Location Patient Served  [facility]    Visit Setting with Client Organization    Patient Status Unhoused    Insurance Medicaid   Client has Forensic psychologist health for primary and mental health   Insurance/Financial Assistance Referral N/A    Medication Have Medication Insecurities   Client stated he had not had his synthroid in several months   Medical Provider No   New patient apt set with [facility]   Screening Referrals Made N/A    Medical Referrals [facility]   [facility]   Medical Appointment Made [facility]   [facility]. 7/22 @ 1:00 pm with Dr. Ellsworth Lennox   Recently w/o PCP, now 1st time PCP visit completed due to CNs referral or appointment made N/A    Food Have Food Insecurities   client is unhoused, eats at [facility] and other places, is aware of Art therapist N/A   Client now has Medicaid transportation   Housing/Utilities No permanent housing    Economist N/A   client is homeless and sleeping in a car or outside   Interventions Advocate/Support;Case Management   assisted in setting up [facility] transportation   Abnormal to Normal Screening Since Last CN Visit N/A    Screenings CN Performed N/A    Sent Client to Lab for: N/A    Did client attend any of the  following based off CNs referral or appointments made? N/A    ED Visit Averted N/A    Life-Saving Intervention Made N/A

## 2023-03-12 ENCOUNTER — Ambulatory Visit (INDEPENDENT_AMBULATORY_CARE_PROVIDER_SITE_OTHER): Payer: 59 | Admitting: Internal Medicine

## 2023-03-12 ENCOUNTER — Encounter: Payer: Self-pay | Admitting: Internal Medicine

## 2023-03-12 VITALS — BP 105/79 | HR 69 | Ht 70.0 in | Wt 162.4 lb

## 2023-03-12 DIAGNOSIS — F1020 Alcohol dependence, uncomplicated: Secondary | ICD-10-CM | POA: Diagnosis not present

## 2023-03-12 DIAGNOSIS — E039 Hypothyroidism, unspecified: Secondary | ICD-10-CM

## 2023-03-12 DIAGNOSIS — D696 Thrombocytopenia, unspecified: Secondary | ICD-10-CM

## 2023-03-12 NOTE — Progress Notes (Signed)
Established Patient Office Visit  Subjective:  Patient ID: Joshua Medina, male    DOB: 1985/12/03  Age: 37 y.o. MRN: [id number]  Chief Complaint  Patient presents with   Establish Care    NPE    Here to establish IM care with h/o acquired hypothyroidism Joshua Medina/p Tx for Graves. Denies any complaints today.    No other concerns at this time.   Past Medical History:  Diagnosis Date   Diabetes mellitus without complication (HCC)    Graves disease    Thyroid disease     Past Surgical History:  Procedure Laterality Date   ESOPHAGOGASTRODUODENOSCOPY (EGD) WITH PROPOFOL N/A 04/19/2022   Procedure: ESOPHAGOGASTRODUODENOSCOPY (EGD) WITH PROPOFOL;  Surgeon: Toney Reil, MD;  Location: [facility] ENDOSCOPY;  Service: Gastroenterology;  Laterality: N/A;   thyroid ablation with radioactive I-131      Social History   Socioeconomic History   Marital status: Single    Spouse name: Not on file   Number of children: Not on file   Years of education: Not on file   Highest education level: High school graduate  Occupational History   Occupation: unemployed  Tobacco Use   Smoking status: Every Day    Current packs/day: 1.00    Average packs/day: 1 pack/day for 20.2 years (20.2 ttl pk-yrs)    Types: Cigarettes    Start date: 12/2002   Smokeless tobacco: Never  Vaping Use   Vaping status: Some Days  Substance and Sexual Activity   Alcohol use: Yes    Comment: fifth of liquor daily   Drug use: Never   Sexual activity: Never  Other Topics Concern   Not on file  Social History Narrative   Not on file   Social Determinants of Health   Financial Resource Strain: Not on file  Food Insecurity: Not on file  Transportation Needs: Not on file  Physical Activity: Not on file  Stress: Not on file  Social Connections: Not on file  Intimate Partner Violence: Not on file    Family History  Problem Relation Age of Onset   Diabetes Paternal Grandmother     Allergies  Allergen  Reactions   Precedex [Dexmedetomidine Hcl In Nacl] Other (See Comments)    Review of Systems  Constitutional: Negative.  Negative for weight loss.  HENT: Negative.    Eyes: Negative.   Respiratory: Negative.    Cardiovascular: Negative.   Gastrointestinal: Negative.   Genitourinary: Negative.   Skin: Negative.   Neurological: Negative.  Negative for headaches.  Endo/Heme/Allergies: Negative.   Psychiatric/Behavioral:  The patient has insomnia.   All other systems reviewed and are negative.      Objective:   BP 105/79   Pulse 69   Ht 5\' 10"  (1.778 m)   Wt 162 lb 6.4 oz (73.7 kg)   SpO2 94%   BMI 23.30 kg/m   Vitals:   03/12/23 1251  BP: 105/79  Pulse: 69  Height: 5\' 10"  (1.778 m)  Weight: 162 lb 6.4 oz (73.7 kg)  SpO2: 94%  BMI (Calculated): 23.3    Physical Exam Vitals reviewed.  Constitutional:      Appearance: Normal appearance.  HENT:     Head: Normocephalic.     Left Ear: There is no impacted cerumen.     Nose: Nose normal.     Mouth/Throat:     Mouth: Mucous membranes are moist.     Pharynx: No posterior oropharyngeal erythema.  Eyes:     Extraocular Movements: Extraocular  movements intact.     Pupils: Pupils are equal, round, and reactive to light.  Cardiovascular:     Rate and Rhythm: Regular rhythm.     Chest Wall: PMI is not displaced.     Pulses: Normal pulses.     Heart sounds: Normal heart sounds. No murmur heard. Pulmonary:     Effort: Pulmonary effort is normal.     Breath sounds: Normal air entry. No rhonchi or rales.  Abdominal:     General: Abdomen is flat. Bowel sounds are normal. There is no distension.     Palpations: Abdomen is soft. There is no hepatomegaly, splenomegaly or mass.     Tenderness: There is no abdominal tenderness.  Musculoskeletal:        General: Normal range of motion.     Cervical back: Normal range of motion and neck supple.     Right lower leg: No edema.     Left lower leg: No edema.  Skin:    General:  Skin is warm and dry.  Neurological:     General: No focal deficit present.     Mental Status: He is alert and oriented to person, place, and time.     Cranial Nerves: No cranial nerve deficit.     Motor: No weakness.  Psychiatric:        Mood and Affect: Mood normal.        Behavior: Behavior normal.      No results found for any visits on 03/12/23.  No results found for this or any previous visit (from the past 2160 hour(Joshua Medina)).    Assessment & Plan:  As per problem list  Problem List Items Addressed This Visit       Endocrine   Hypothyroidism (acquired) - Primary   Relevant Orders   TSH   Comprehensive metabolic panel   Lipid panel     Hematopoietic and Hemostatic   Thrombocytopenia (HCC)   Relevant Orders   CBC With Diff/Platelet     Other   Alcohol use disorder, moderate, dependence (HCC)    Return in about 2 weeks (around 03/26/2023) for cpe with labs prior.   Total time spent: 45 minutes  Joshua Fuse, MD  03/12/2023   This document may have been prepared by [facility] Voice Recognition software and as such may include unintentional dictation errors.

## 2023-03-14 NOTE — Congregational Nurse Program (Signed)
  Dept: [phone]   Congregational Nurse Program Note  Date of Encounter: 03/14/2023 Client to [facility] to discuss his new patient apt at [facility] on 7/22. He has labs scheduled next week and has a follow up apt on 8/5. He was pleased with his visit and had no problems with his Medicaid transportation. He plans to get a friend to provided transportation for his labs, will use Medicaid transportation for his 8/5 apt. Francesco Runner BSN, RN Past Medical History: Past Medical History:  Diagnosis Date   Diabetes mellitus without complication (HCC)    Graves disease    Thyroid disease     Encounter Details:  CNP Questionnaire - 03/14/23 1154       Questionnaire   Ask client: Do you give verbal consent for me to treat you today? Yes    Student Assistance N/A    Location Patient Served  [facility]    Visit Setting with Client Organization    Patient Status Unhoused    Insurance Medicaid   Client has [facility] for primary and mental health   Insurance/Financial Assistance Referral N/A    Medication Have Medication Insecurities   client will have labs next week to be started back on his thyroid medication   Medical Provider Yes   [facility] Dr. Celedonio Miyamoto   Screening Referrals Made N/A    Medical Referrals Made N/A    Medical Appointment Made N/A    Recently w/o PCP, now 1st time PCP visit completed due to CNs referral or appointment made Yes   client did attend his new patient apt at [facility]   Food Have Food Insecurities   client is unhoused, eats at [facility] and other places, is aware of Art therapist N/A   Client now has Medicaid transportation   Housing/Utilities No permanent housing    Economist N/A   client is homeless and sleeping in a car or outside   Interventions Advocate/Support;Case Management   assisted in setting up [employer] transportation   Abnormal to Normal Screening Since Last CN Visit N/A     Screenings CN Performed N/A    Sent Client to Lab for: N/A    Did client attend any of the following based off CNs referral or appointments made? Yes;Medical    ED Visit Averted N/A    Life-Saving Intervention Made N/A

## 2023-03-20 ENCOUNTER — Encounter: Payer: Self-pay | Admitting: Emergency Medicine

## 2023-03-20 ENCOUNTER — Emergency Department
Admission: EM | Admit: 2023-03-20 | Discharge: 2023-03-20 | Disposition: A | Discharge status: Home / Self Care | Payer: 59 | Attending: Emergency Medicine | Admitting: Emergency Medicine

## 2023-03-20 DIAGNOSIS — I10 Essential (primary) hypertension: Secondary | ICD-10-CM | POA: Diagnosis not present

## 2023-03-20 DIAGNOSIS — F10129 Alcohol abuse with intoxication, unspecified: Secondary | ICD-10-CM | POA: Diagnosis not present

## 2023-03-20 DIAGNOSIS — X58XXXA Exposure to other specified factors, initial encounter: Secondary | ICD-10-CM | POA: Diagnosis not present

## 2023-03-20 DIAGNOSIS — Y908 Blood alcohol level of 240 mg/100 ml or more: Secondary | ICD-10-CM | POA: Insufficient documentation

## 2023-03-20 DIAGNOSIS — S51012A Laceration without foreign body of left elbow, initial encounter: Secondary | ICD-10-CM | POA: Insufficient documentation

## 2023-03-20 DIAGNOSIS — S59902A Unspecified injury of left elbow, initial encounter: Secondary | ICD-10-CM | POA: Diagnosis not present

## 2023-03-20 DIAGNOSIS — F10929 Alcohol use, unspecified with intoxication, unspecified: Secondary | ICD-10-CM

## 2023-03-20 LAB — BASIC METABOLIC PANEL
Anion gap: 15 (ref 5–15)
BUN: 7 mg/dL (ref 6–20)
CO2: 22 mmol/L (ref 22–32)
Calcium: 9.4 mg/dL (ref 8.9–10.3)
Chloride: 105 mmol/L (ref 98–111)
Creatinine, Ser: 0.91 mg/dL (ref 0.61–1.24)
GFR, Estimated: >60 mL/min (ref >60)
Glucose, Bld: 87 mg/dL (ref 70–99)
Potassium: 3.5 mmol/L (ref 3.5–5.1)
Sodium: 142 mmol/L (ref 135–145)

## 2023-03-20 LAB — CBC
HCT: 51.3 % (ref 39.0–52.0)
Hemoglobin: 16.8 g/dL (ref 13.0–17.0)
MCH: 33.2 pg (ref 26.0–34.0)
MCHC: 32.7 g/dL (ref 30.0–36.0)
MCV: 101.4 fL — ABNORMAL HIGH (ref 80.0–100.0)
Platelets: 132 10*3/uL — ABNORMAL LOW (ref 150–400)
RBC: 5.06 MIL/uL (ref 4.22–5.81)
RDW: 15.3 % (ref 11.5–15.5)
WBC: 8 10*3/uL (ref 4.0–10.5)
nRBC: 0 % (ref 0.0–0.2)

## 2023-03-20 LAB — ETHANOL: Alcohol, Ethyl (B): 477 mg/dL (ref <10)

## 2023-03-20 NOTE — ED Notes (Signed)
Pt asked to get dressed for the second time. Pt agreed. Privacy provided.

## 2023-03-20 NOTE — ED Notes (Signed)
Pt continues to sleep. Pt had removed monitoring leads.

## 2023-03-20 NOTE — ED Notes (Signed)
Pt continues to sleep.

## 2023-03-20 NOTE — ED Triage Notes (Signed)
Pt arrived via [facility] from side of road where [employer] found pt sleeping on sidewalk and woke him. Pt had 1/2 gallon liquor sitting beside him that he had consumed and [employer] called EMS. Per EMS, [employer] reported that pt could not go to jail intoxicated and would receive ticket and need to be transported to ED for his alcohol intoxication.

## 2023-03-20 NOTE — ED Notes (Signed)
Pt getting dressed at this time.

## 2023-03-20 NOTE — ED Provider Notes (Signed)
[facility] Provider Note    Event Date/Time   First MD Initiated Contact with Patient 03/20/23 [phone]     (approximate)   History   Alcohol Intoxication   HPI Joshua Medina is a 37 y.o. male who presents by EMS for alcohol intoxication.  Reportedly the police found him lying outside a business and woke him up to give him a ticket.  However they had him perform a breathalyzer test and called EMS when his level was high.  He also had an injury to his left elbow.  Paramedics cleaned and wrapped his elbow but the police officer reportedly insisted that paramedics bring the patient to the emergency department because of his intoxication.  The patient is awake and alert but clearly intoxicated.  He has no complaints but will not provide any additional history about what happened tonight.  When asked, she had a drink he said "a lot".  He does not know why or how he hurt his elbow.  He denies any other pain.     Physical Exam   Triage Vital Signs: ED Triage Vitals  Encounter Vitals Group     BP 03/20/23 0319 96/72     Systolic BP Percentile --      Diastolic BP Percentile --      Pulse Rate 03/20/23 0319 (!) 51     Resp 03/20/23 0319 20     Temp 03/20/23 0319 98.4 F (36.9 C)     Temp Source 03/20/23 0319 Oral     SpO2 03/20/23 0319 100 %     Weight 03/20/23 0318 73.5 kg (162 lb)     Height 03/20/23 0318 1.778 m (5\' 10" )     Head Circumference --      Peak Flow --      Pain Score --      Pain Loc --      Pain Education --      Exclude from Growth Chart --     Most recent vital signs: Vitals:   03/20/23 0319  BP: 96/72  Pulse: (!) 51  Resp: 20  Temp: 98.4 F (36.9 C)  SpO2: 100%    General: Awake, alert but intoxicated. CV:  Good peripheral perfusion.  Regular rate and rhythm. Resp:  Normal effort. Speaking easily and comfortably, no accessory muscle usage nor intercostal retractions.   Abd:  No distention.  Other:  1 cm laceration to the left  elbow, no contamination, cleaned by EMS.   ED Results / Procedures / Treatments   Labs (all labs ordered are listed, but only abnormal results are displayed) Labs Reviewed  ETHANOL - Abnormal; Notable for the following components:      Result Value   Alcohol, Ethyl (B) 477 (*)    All other components within normal limits  CBC - Abnormal; Notable for the following components:   MCV 101.4 (*)    Platelets 132 (*)    All other components within normal limits  BASIC METABOLIC PANEL       PROCEDURES:  Critical Care performed: No  ..Laceration Repair  Date/Time: 03/20/2023 5:59 AM  Performed by: Loleta Rose, MD Authorized by: Loleta Rose, MD   Consent:    Consent obtained:  Verbal Laceration details:    Location:  Shoulder/arm   Shoulder/arm location:  L elbow   Length (cm):  1 Treatment:    Irrigation solution:  Sterile water Skin repair:    Repair method:  Tissue adhesive Approximation:  Approximation:  Close Repair type:    Repair type:  Simple Post-procedure details:    Procedure completion:  Tolerated well, no immediate complications     IMPRESSION / MDM / ASSESSMENT AND PLAN / ED COURSE  I reviewed the triage vital signs and the nursing notes.                              Differential diagnosis includes, but is not limited to, alcohol intoxication, other substance abuse, head injury.  Patient's presentation is most consistent with acute presentation with potential threat to life or bodily function.  Labs/studies ordered: Ethanol level, basic metabolic panel, CBC  Interventions/Medications given:  Medications - No data to display  (Note:  hospital course my include additional interventions and/or labs/studies not listed above.)   Patient is intoxicated and has been seen for the same multiple times in the past and this emergency department.  He is somewhat verbally belligerent but is not representing a danger to himself or others.  We will  watch him until it is safe to discharge him.  He currently cannot stand on his own and has an ethanol level of nearly 500.  Metabolic panel and CBC are appropriate.  Transferring ED care to Dr. Scotty Court to reassess when the patient is sufficiently clinically sober for discharge.         FINAL CLINICAL IMPRESSION(S) / ED DIAGNOSES   Final diagnoses:  Alcoholic intoxication with complication (HCC)  Elbow laceration, left, initial encounter     Rx / DC Orders   ED Discharge Orders     None        Note:  This document was prepared using Dragon voice recognition software and may include unintentional dictation errors.   Loleta Rose, MD 03/20/23 [phone]

## 2023-03-20 NOTE — ED Notes (Signed)
Pt asked to get dressed for the 4th time. Pt still laying in bed opening belongings and eating chips.

## 2023-03-20 NOTE — ED Provider Notes (Signed)
Procedures     ----------------------------------------- 9:47 AM on 03/20/2023 -----------------------------------------  Patient is lucid, clear speech, steady gait, tolerating oral intake.  He is clinically sober for discharge.    Sharman Cheek, MD 03/20/23 [phone]

## 2023-03-20 NOTE — ED Notes (Signed)
Pt moved to room 9 for better visibility. Pt unsteady on feet, unable to walk. Bed alarm placed.

## 2023-03-20 NOTE — ED Notes (Addendum)
Pt asked to get dressed for the third time. Pt is laying in bed eating chips at this time. Asked for different clothes. Hospital Scrubs provided for discharge.

## 2023-03-26 ENCOUNTER — Encounter: Payer: 59 | Admitting: Internal Medicine

## 2023-03-28 NOTE — Congregational Nurse Program (Signed)
  Dept: [phone]   Congregational Nurse Program Note  Date of Encounter: 03/28/2023 Client to [facility] with request for assistance in setting up his Medicaid transportation to get his labs drawn next week. Transportation set up with an 8:15 am pick up here at the center on Tuesday 8/13.  Return ride also set up. RN assisted client with a F[employer] benefit application. He has not had any Food stamp benefits since December of 2023. Application to DSS today. Francesco Runner BSN, RN  Past Medical History: Past Medical History:  Diagnosis Date   Diabetes mellitus without complication (HCC)    Graves disease    Thyroid disease     Encounter Details:  CNP Questionnaire - 03/28/23 1130       Questionnaire   Ask client: Do you give verbal consent for me to treat you today? Yes    Student Assistance N/A    Location Patient Served  [facility]    Visit Setting with Client Organization    Patient Status Unhoused    Insurance Medicaid   Client has [employer] [facility] for primary and mental health   Insurance/Financial Assistance Referral N/A    Medication Have Medication Insecurities   labs to be done 8/13, client expects to be prescribed synthroid   Medical Provider Yes   [facility] Dr. Celedonio Miyamoto   Screening Referrals Made N/A    Medical Referrals Made N/A    Medical Appointment Made N/A    Recently w/o PCP, now 1st time PCP visit completed due to CNs referral or appointment made Yes   client did attend his new patient apt at [facility]   Food Have Food Insecurities   Food stamp application completed and dropped off at DSS today 8/7   Transportation Provided transportation assistance   RN assited client is setting up Medicaid transportation for his lab draw on 8/13   Housing/Utilities No permanent housing   Client is working with the housing navigator at the [employer] on housing   Interpersonal Safety N/A    Interventions Advocate/Support;Case Management   assisted in setting up  [employer] transportation and complete food stamp application   Abnormal to Normal Screening Since Last CN Visit N/A    Screenings CN Performed N/A    Sent Client to Lab for: N/A    Did client attend any of the following based off CNs referral or appointments made? N/A    ED Visit Averted N/A    Life-Saving Intervention Made N/A

## 2023-04-03 ENCOUNTER — Other Ambulatory Visit
Admission: RE | Admit: 2023-04-03 | Discharge: 2023-04-03 | Disposition: A | Discharge status: Home / Self Care | Payer: 59 | Attending: Internal Medicine | Admitting: Internal Medicine

## 2023-04-03 DIAGNOSIS — E039 Hypothyroidism, unspecified: Secondary | ICD-10-CM | POA: Diagnosis not present

## 2023-04-03 DIAGNOSIS — D696 Thrombocytopenia, unspecified: Secondary | ICD-10-CM | POA: Insufficient documentation

## 2023-04-03 LAB — CBC WITH DIFFERENTIAL/PLATELET
Abs Immature Granulocytes: 0.02 10*3/uL (ref 0.00–0.07)
Basophils Absolute: 0.1 10*3/uL (ref 0.0–0.1)
Basophils Relative: 2 %
Eosinophils Absolute: 0.2 10*3/uL (ref 0.0–0.5)
Eosinophils Relative: 3 %
HCT: 47 % (ref 39.0–52.0)
Hemoglobin: 15.6 g/dL (ref 13.0–17.0)
Immature Granulocytes: 0 %
Lymphocytes Relative: 39 %
Lymphs Abs: 2.2 10*3/uL (ref 0.7–4.0)
MCH: 33.5 pg (ref 26.0–34.0)
MCHC: 33.2 g/dL (ref 30.0–36.0)
MCV: 100.9 fL — ABNORMAL HIGH (ref 80.0–100.0)
Monocytes Absolute: 0.4 10*3/uL (ref 0.1–1.0)
Monocytes Relative: 8 %
Neutro Abs: 2.7 10*3/uL (ref 1.7–7.7)
Neutrophils Relative %: 48 %
Platelets: 119 10*3/uL — ABNORMAL LOW (ref 150–400)
RBC: 4.66 MIL/uL (ref 4.22–5.81)
RDW: 14.7 % (ref 11.5–15.5)
WBC: 5.6 10*3/uL (ref 4.0–10.5)
nRBC: 0 % (ref 0.0–0.2)

## 2023-04-03 LAB — COMPREHENSIVE METABOLIC PANEL
ALT: 62 U/L — ABNORMAL HIGH (ref 0–44)
AST: 74 U/L — ABNORMAL HIGH (ref 15–41)
Albumin: 4.7 g/dL (ref 3.5–5.0)
Alkaline Phosphatase: 71 U/L (ref 38–126)
Anion gap: 8 (ref 5–15)
BUN: 12 mg/dL (ref 6–20)
CO2: 28 mmol/L (ref 22–32)
Calcium: 9.6 mg/dL (ref 8.9–10.3)
Chloride: 102 mmol/L (ref 98–111)
Creatinine, Ser: 1.28 mg/dL — ABNORMAL HIGH (ref 0.61–1.24)
GFR, Estimated: >60 mL/min (ref >60)
Glucose, Bld: 105 mg/dL — ABNORMAL HIGH (ref 70–99)
Potassium: 4.7 mmol/L (ref 3.5–5.1)
Sodium: 138 mmol/L (ref 135–145)
Total Bilirubin: 0.5 mg/dL (ref 0.3–1.2)
Total Protein: 7.6 g/dL (ref 6.5–8.1)

## 2023-04-03 LAB — LIPID PANEL
Cholesterol: 236 mg/dL — ABNORMAL HIGH (ref 0–200)
HDL: 54 mg/dL (ref >40)
LDL Cholesterol: 164 mg/dL — ABNORMAL HIGH (ref 0–99)
Total CHOL/HDL Ratio: 4.4 RATIO
Triglycerides: 88 mg/dL (ref <150)
VLDL: 18 mg/dL (ref 0–40)

## 2023-04-03 LAB — TSH: TSH: 64 u[IU]/mL — ABNORMAL HIGH (ref 0.350–4.500)

## 2023-04-17 ENCOUNTER — Ambulatory Visit: Payer: 59 | Admitting: Internal Medicine

## 2023-05-24 NOTE — Congregational Nurse Program (Signed)
  Dept: [phone]   Congregational Nurse Program Note  Date of Encounter: 05/24/2023 Client to [facility] day center with need of a copy of some of his lab work. RN assisted him with contacting [facility] medical records. He was able to arrange to have a release of information consent sent to his email. No other needs at this time. Francesco Runner BSN, RN Past Medical History: Past Medical History:  Diagnosis Date   Diabetes mellitus without complication (HCC)    Graves disease    Thyroid disease     Encounter Details:  CNP Questionnaire - 05/24/23 1115       Questionnaire   Ask client: Do you give verbal consent for me to treat you today? Yes    Student Assistance N/A    Location Patient Served  [facility]    Visit Setting with Client Organization    Patient Status Unhoused   working with a program through the [employer] on housing   [employer]   Client has [employer] [facility] for primary and mental health   Insurance/Financial Assistance Referral N/A    Medication Have Medication Insecurities   labs to be done 8/13, client expects to be prescribed synthroid   Medical Provider Yes   [facility] Dr. Celedonio Miyamoto   Screening Referrals Made N/A    Medical Referrals Made N/A    Medical Appointment Made N/A    Recently w/o PCP, now 1st time PCP visit completed due to CNs referral or appointment made N/A    Food N/A   client does now have food stamps   Transportation Provided transportation assistance   RN assited client is setting up Medicaid transportation for his lab draw on 8/13   Housing/Utilities No permanent housing   Client is working with the housing navigator at the [employer] on housing   Interpersonal Safety N/A    Interventions Advocate/Support;Case Management   assisted with obtaining medical records   Abnormal to Normal Screening Since Last CN Visit N/A    Screenings CN Performed N/A    Sent Client to Lab for: N/A    Did client attend any of the  following based off CNs referral or appointments made? N/A    ED Visit Averted N/A    Life-Saving Intervention Made N/A

## 2023-05-29 NOTE — Congregational Nurse Program (Signed)
  Dept: [phone]   Congregational Nurse Program Note  Date of Encounter: 05/29/2023 Client to [facility] for support and to advise he had decided that he was not going to rehab. He reported that he had done his own detox. He did meet with the case manager from the [employer] yesterday and is still waiting on housing through that program. Francesco Runner BSN, RN Past Medical History: Past Medical History:  Diagnosis Date   Diabetes mellitus without complication (HCC)    Graves disease    Thyroid disease     Encounter Details:  CNP Questionnaire - 05/29/23 1146       Questionnaire   Ask client: Do you give verbal consent for me to treat you today? Yes    Student Assistance N/A    Location Patient Served  [facility]    Visit Setting with Client Organization    Patient Status Unhoused   working with a program through the [employer] on housing   [employer]   Client has [facility] [facility] for primary and mental health   Insurance/Financial Assistance Referral N/A    Medication Have Medication Insecurities   labs to be done 8/13, client expects to be prescribed synthroid   Medical Provider Yes   [facility] Dr. Celedonio Miyamoto   Screening Referrals Made N/A    Medical Referrals Made N/A    Medical Appointment Made N/A    Recently w/o PCP, now 1st time PCP visit completed due to CNs referral or appointment made N/A    Food N/A   client does now have food stamps   Transportation N/A    Housing/Utilities No permanent housing   Client is working with the housing navigator at the [employer] on housing   Interpersonal Safety N/A    Interventions Advocate/Support;Case Management   assisted with obtaining medical records   Abnormal to Normal Screening Since Last CN Visit N/A    Screenings CN Performed N/A    Sent Client to Lab for: N/A    Did client attend any of the following based off CNs referral or appointments made? N/A    ED Visit Averted N/A    Life-Saving  Intervention Made N/A

## 2023-05-31 NOTE — Congregational Nurse Program (Signed)
  Dept: [phone]   Congregational Nurse Program Note  Date of Encounter: 05/31/2023 Client to [facility] day center with request for assistance in finding out whether he had a [facility] He was seen by his PCP on 7/22 and had labs on 8/13, but did not attend his follow up apt. RN assisted client in calling [facility] to set up an apt. To be seen for his physical. Appointment set for 10/28 at 11 am. RN to assist with Medicaid transportation closer to the apt date. Francesco Runner BSN, RN Past Medical History: Past Medical History:  Diagnosis Date   Diabetes mellitus without complication (HCC)    Graves disease    Thyroid disease     Encounter Details:  CNP Questionnaire - 05/31/23 0935       Questionnaire   Ask client: Do you give verbal consent for me to treat you today? Yes    Student Assistance N/A    Location Patient Served  [facility]    Visit Setting with Client Organization    Patient Status Unhoused   working with a program through the [employer] on housing   [employer]   Client has [facility] [facility] for primary and mental health   Insurance/Financial Assistance Referral N/A    Medication Have Medication Insecurities   labs to be done 8/13, client expects to be prescribed synthroid   Medical Provider Yes   [facility] Dr. Celedonio Miyamoto   Screening Referrals Made N/A    Medical Referrals Made N/A    Medical Appointment Made Non[facility] PCP/clinic   follow up apt made at [facility] with Dr. Ellsworth Lennox 10/28 at 111 am   Recently w/o PCP, now 1st time PCP visit completed due to CNs referral or appointment made N/A    Food N/A   client does now have food stamps   Transportation N/A    Housing/Utilities No permanent housing   Client is working with the housing navigator at the [employer] on housing   Interpersonal Safety N/A    Interventions Advocate/Support;Case Management;[facility]   assisted with  checking on Rx   Abnormal to Normal Screening Since Last CN Visit N/A    Screenings CN Performed N/A    Sent Client to Lab for: N/A    Did client attend any of the following based off CNs referral or appointments made? N/A    ED Visit Averted N/A    Life-Saving Intervention Made N/A

## 2023-06-14 NOTE — Congregational Nurse Program (Signed)
  Dept: [phone]   Congregational Nurse Program Note  Date of Encounter: 06/14/2023 Client to [facility] day center for assistance in arranging Medicaid transportation to his PCP apt on 10/28. Client to be picked up at the center and returned here after his appointment. Information given to client in written form. Francesco Runner BSN, RN Past Medical History: Past Medical History:  Diagnosis Date   Diabetes mellitus without complication (HCC)    Graves disease    Thyroid disease     Encounter Details:  Community Questionnaire - 06/14/23 1055       Questionnaire   Ask client: Do you give verbal consent for me to treat you today? Yes    Student Assistance N/A    Location Patient Served  [facility]    Encounter Setting CN site    Population Status Unhoused   working with a program through the [employer] on housing   [employer]   Client has [facility] [facility] for primary and mental health   Insurance/Financial Assistance Referral N/A    Medication Have Medication Insecurities   labs to be done 8/13, client expects to be prescribed synthroid   Medical Provider Yes   [facility] Dr. Celedonio Miyamoto   Screening Referrals Made N/A    Medical Referrals Made N/A    Medical Appointment Completed N/A    CNP Interventions Advocate/Support;Case Management   assisted with checking on Rx   Screenings CN Performed N/A    ED Visit Averted N/A    Life-Saving Intervention Made N/A      Questionnaire   Housing/Utilities No permanent housing   Client is working with the housing navigator at the [employer] on housing

## 2023-06-18 ENCOUNTER — Encounter: Payer: Self-pay | Admitting: Internal Medicine

## 2023-06-18 ENCOUNTER — Ambulatory Visit (INDEPENDENT_AMBULATORY_CARE_PROVIDER_SITE_OTHER): Payer: 59 | Admitting: Internal Medicine

## 2023-06-18 VITALS — BP 90/70 | HR 88 | Ht 70.0 in | Wt 160.6 lb

## 2023-06-18 DIAGNOSIS — I10 Essential (primary) hypertension: Secondary | ICD-10-CM

## 2023-06-18 DIAGNOSIS — Z1331 Encounter for screening for depression: Secondary | ICD-10-CM

## 2023-06-18 DIAGNOSIS — Z0001 Encounter for general adult medical examination with abnormal findings: Secondary | ICD-10-CM

## 2023-06-18 DIAGNOSIS — E039 Hypothyroidism, unspecified: Secondary | ICD-10-CM

## 2023-06-18 DIAGNOSIS — D696 Thrombocytopenia, unspecified: Secondary | ICD-10-CM | POA: Diagnosis not present

## 2023-06-18 DIAGNOSIS — K709 Alcoholic liver disease, unspecified: Secondary | ICD-10-CM | POA: Diagnosis not present

## 2023-06-18 MED ORDER — LEVOTHYROXINE SODIUM 75 MCG PO TABS: 75.0000 ug | ORAL_TABLET | Freq: Every day | ORAL | 11 refills | Status: DC

## 2023-06-18 NOTE — Progress Notes (Signed)
Established Patient Office Visit  Subjective:  Patient ID: Joshua Medina, male    DOB: 03-03-1986  Age: 37 y.o. MRN: [id number]  Chief Complaint  Patient presents with   Annual Exam    CPE lab results    No new complaints, here for CPE, lab review and medication refills. Labs reviewed and notable for elevated TSH, high LDL and elevated transaminases.     No other concerns at this time.   Past Medical History:  Diagnosis Date   Diabetes mellitus without complication (HCC)    Graves disease    Thyroid disease     Past Surgical History:  Procedure Laterality Date   ESOPHAGOGASTRODUODENOSCOPY (EGD) WITH PROPOFOL N/A 04/19/2022   Procedure: ESOPHAGOGASTRODUODENOSCOPY (EGD) WITH PROPOFOL;  Surgeon: Toney Reil, MD;  Location: [facility];  Service: Gastroenterology;  Laterality: N/A;   thyroid ablation with radioactive I-131      Social History   Socioeconomic History   Marital status: Single    Spouse name: Not on file   Number of children: Not on file   Years of education: Not on file   Highest education level: High school graduate  Occupational History   Occupation: unemployed  Tobacco Use   Smoking status: Every Day    Current packs/day: 0.50    Average packs/day: 0.5 packs/day for 21.8 years (10.9 ttl pk-yrs)    Types: Cigarettes    Start date: 08/21/2001   Smokeless tobacco: Never  Vaping Use   Vaping status: Some Days  Substance and Sexual Activity   Alcohol use: Not Currently    Comment: fifth of liquor daily   Drug use: Never   Sexual activity: Not Currently  Other Topics Concern   Not on file  Social History Narrative   Not on file   Social Determinants of Health   Financial Resource Strain: Not on file  Food Insecurity: Food Insecurity Present (06/14/2023)   Hunger Vital Sign    Worried About Running Out of Food in the Last Year: Sometimes true    Ran Out of Food in the Last Year: Sometimes true  Transportation Needs: Unmet  Transportation Needs (06/14/2023)   PRAPARE - Administrator, Civil Service (Medical): Yes    Lack of Transportation (Non-Medical): Yes  Physical Activity: Not on file  Stress: Not on file  Social Connections: Not on file  Intimate Partner Violence: Not At Risk (06/14/2023)   Humiliation, Afraid, Rape, and Kick questionnaire    Fear of Current or Ex-Partner: No    Emotionally Abused: No    Physically Abused: No    Sexually Abused: No    Family History  Problem Relation Age of Onset   Diabetes Paternal Grandmother     Allergies  Allergen Reactions   Precedex [Dexmedetomidine Hcl In Nacl] Other (See Comments)    Review of Systems  Constitutional: Negative.  Negative for weight loss.  HENT: Negative.    Eyes: Negative.   Respiratory: Negative.    Cardiovascular: Negative.   Gastrointestinal: Negative.  Negative for blood in stool.  Genitourinary: Negative.   Skin: Negative.   Neurological: Negative.  Negative for tremors and headaches.  Endo/Heme/Allergies: Negative.   Psychiatric/Behavioral:  The patient has insomnia.   All other systems reviewed and are negative.      Objective:   BP (!) 140/80   Pulse 88   Ht 5\' 10"  (1.778 m)   Wt 160 lb 9.6 oz (72.8 kg)   SpO2 98%   BMI  23.04 kg/m   Vitals:   06/18/23 1057  BP: (!) 140/80  Pulse: 88  Height: 5\' 10"  (1.778 m)  Weight: 160 lb 9.6 oz (72.8 kg)  SpO2: 98%  BMI (Calculated): 23.04    Physical Exam Vitals reviewed.  Constitutional:      Appearance: Normal appearance.  HENT:     Head: Normocephalic.     Left Ear: There is no impacted cerumen.     Nose: Nose normal.     Mouth/Throat:     Mouth: Mucous membranes are moist.     Pharynx: No posterior oropharyngeal erythema.  Eyes:     Extraocular Movements: Extraocular movements intact.     Pupils: Pupils are equal, round, and reactive to light.  Cardiovascular:     Rate and Rhythm: Regular rhythm.     Chest Wall: PMI is not displaced.      Pulses: Normal pulses.     Heart sounds: Normal heart sounds. No murmur heard. Pulmonary:     Effort: Pulmonary effort is normal.     Breath sounds: Normal air entry. No rhonchi or rales.  Abdominal:     General: Abdomen is flat. Bowel sounds are normal. There is no distension.     Palpations: Abdomen is soft. There is no hepatomegaly, splenomegaly or mass.     Tenderness: There is no abdominal tenderness.  Musculoskeletal:        General: Normal range of motion.     Cervical back: Normal range of motion and neck supple.     Right lower leg: No edema.     Left lower leg: No edema.  Skin:    General: Skin is warm and dry.  Neurological:     General: No focal deficit present.     Mental Status: He is alert and oriented to person, place, and time.     Cranial Nerves: No cranial nerve deficit.     Motor: No weakness.  Psychiatric:        Mood and Affect: Mood normal.        Behavior: Behavior normal.      No results found for any visits on 06/18/23.  Recent Results (from the past 2160 hour(Humzah Harty))  CBC with Differential/Platelet     Status: Abnormal   Collection Time: 04/03/23  7:33 AM  Result Value Ref Range   WBC 5.6 4.0 - 10.5 K/uL   RBC 4.66 4.22 - 5.81 MIL/uL   Hemoglobin 15.6 13.0 - 17.0 g/dL   HCT 16.1 09.6 - 04.5 %   MCV 100.9 (H) 80.0 - 100.0 fL   MCH 33.5 26.0 - 34.0 pg   MCHC 33.2 30.0 - 36.0 g/dL   RDW 40.9 81.1 - 91.4 %   Platelets 119 (L) 150 - 400 K/uL   nRBC 0.0 0.0 - 0.2 %   Neutrophils Relative % 48 %   Neutro Abs 2.7 1.7 - 7.7 K/uL   Lymphocytes Relative 39 %   Lymphs Abs 2.2 0.7 - 4.0 K/uL   Monocytes Relative 8 %   Monocytes Absolute 0.4 0.1 - 1.0 K/uL   Eosinophils Relative 3 %   Eosinophils Absolute 0.2 0.0 - 0.5 K/uL   Basophils Relative 2 %   Basophils Absolute 0.1 0.0 - 0.1 K/uL   Immature Granulocytes 0 %   Abs Immature Granulocytes 0.02 0.00 - 0.07 K/uL    Comment: Performed at [facility], [address], [place], [place]  [address]  Lipid panel  Status: Abnormal   Collection Time: 04/03/23  7:33 AM  Result Value Ref Range   Cholesterol 236 (H) 0 - 200 mg/dL   Triglycerides 88 <191 mg/dL   HDL 54 >47 mg/dL   Total CHOL/HDL Ratio 4.4 RATIO   VLDL 18 0 - 40 mg/dL   LDL Cholesterol 829 (H) 0 - 99 mg/dL    Comment:        Total Cholesterol/HDL:CHD Risk Coronary Heart Disease Risk Table                     Men   Women  1/2 Average Risk   3.4   3.3  Average Risk       5.0   4.4  2 X Average Risk   9.6   7.1  3 X Average Risk  23.4   11.0        Use the calculated Patient Ratio above and the CHD Risk Table to determine the patient'Antonella Upson CHD Risk.        ATP III CLASSIFICATION (LDL):  <100     mg/dL   Optimal  562-130  mg/dL   Near or Above                    Optimal  130-159  mg/dL   Borderline  865-784  mg/dL   High  >696     mg/dL   Very High Performed at [facility], [address] [place], [place] [address]   Comprehensive metabolic panel     Status: Abnormal   Collection Time: 04/03/23  7:33 AM  Result Value Ref Range   Sodium 138 135 - 145 mmol/L   Potassium 4.7 3.5 - 5.1 mmol/L   Chloride 102 98 - 111 mmol/L   CO2 28 22 - 32 mmol/L   Glucose, Bld 105 (H) 70 - 99 mg/dL    Comment: Glucose reference range applies only to samples taken after fasting for at least 8 hours.   BUN 12 6 - 20 mg/dL   Creatinine, Ser 4.13 (H) 0.61 - 1.24 mg/dL   Calcium 9.6 8.9 - 24.4 mg/dL   Total Protein 7.6 6.5 - 8.1 g/dL   Albumin 4.7 3.5 - 5.0 g/dL   AST 74 (H) 15 - 41 U/L   ALT 62 (H) 0 - 44 U/L   Alkaline Phosphatase 71 38 - 126 U/L   Total Bilirubin 0.5 0.3 - 1.2 mg/dL   GFR, Estimated >01 >02 mL/min    Comment: (NOTE) Calculated using the CKD-EPI Creatinine Equation (2021)    Anion gap 8 5 - 15    Comment: Performed at [facility], [address] [place], [place] [address]  TSH     Status: Abnormal   Collection Time: 04/03/23  7:33 AM  Result Value Ref Range   TSH 64.000 (H)  0.350 - 4.500 uIU/mL    Comment: Performed by a 3rd Generation assay with a functional sensitivity of <=0.01 uIU/mL. Performed at [facility], [address], [place], [place] [address]       Assessment & Plan:  As per problem list. Resume T4. Stricter low calorie diet, low cholesterol and low fat diet and exercise as much as possible.  Problem List Items Addressed This Visit       Digestive   Alcoholic liver disease (HCC)     Endocrine   Hypothyroidism (acquired)   Relevant Medications   levothyroxine (SYNTHROID) 75 MCG tablet  Hematopoietic and Hemostatic   Thrombocytopenia (HCC)   Relevant Orders   TSH   Other Visit Diagnoses     Encounter for general adult medical examination with abnormal findings    -  Primary       Return in about 6 weeks (around 07/30/2023) for fu with labs prior.   Total time spent: 45 minutes  Luna Fuse, MD  06/18/2023   This document may have been prepared by [facility] Voice Recognition software and as such may include unintentional dictation errors.

## 2023-06-20 NOTE — Congregational Nurse Program (Signed)
  Dept: [phone]   Congregational Nurse Program Note  Date of Encounter: 06/19/2023 Client to [facility] day center to discuss his PCP apt from 10/28. He was prescribed synthroid 75 mcg and has picked it up form [facility]. He was only able to pick up a 30 day supply, but does have refills and will plan for the copay each month per his report. He is currently temporarily housed "with a friend". RN verified with client that he does have food stamps. Next PCP apt is Dec. 10. RN will again assist with his Medicaid transportation. Francesco Runner BSN, RN Past Medical History: Past Medical History:  Diagnosis Date   Diabetes mellitus without complication (HCC)    Graves disease    Thyroid disease     Encounter Details:  Community Questionnaire - 06/19/23 1030       Questionnaire   Ask client: Do you give verbal consent for me to treat you today? Yes    Student Assistance N/A    Location Patient Served  [facility]    Encounter Setting CN site    Population Status Unhoused   working with a program through the [employer] on housing   [employer]   Client has [employer] [facility] for primary and mental health   Insurance/Financial Assistance Referral N/A    Medication Have Medication Insecurities   labs to be done 8/13, client expects to be prescribed synthroid   Medical Provider Yes   [facility] Dr. Celedonio Miyamoto   Screening Referrals Made N/A    Medical Referrals Made N/A    Medical Appointment Completed N/A    CNP Interventions Advocate/Support;Case Management   assisted with checking on Rx   Screenings CN Performed N/A    ED Visit Averted N/A    Life-Saving Intervention Made N/A      Questionnaire   Housing/Utilities No permanent housing   Client is working with the housing navigator at the [employer] on housing

## 2023-07-31 ENCOUNTER — Ambulatory Visit: Payer: 59 | Admitting: Internal Medicine

## 2023-08-08 ENCOUNTER — Other Ambulatory Visit: Payer: Self-pay

## 2023-08-08 ENCOUNTER — Emergency Department
Admission: EM | Admit: 2023-08-08 | Discharge: 2023-08-08 | Payer: 59 | Attending: Emergency Medicine | Admitting: Emergency Medicine

## 2023-08-08 ENCOUNTER — Emergency Department: Payer: 59

## 2023-08-08 DIAGNOSIS — F10220 Alcohol dependence with intoxication, uncomplicated: Secondary | ICD-10-CM | POA: Diagnosis not present

## 2023-08-08 DIAGNOSIS — Y908 Blood alcohol level of 240 mg/100 ml or more: Secondary | ICD-10-CM | POA: Insufficient documentation

## 2023-08-08 DIAGNOSIS — F10129 Alcohol abuse with intoxication, unspecified: Secondary | ICD-10-CM | POA: Diagnosis not present

## 2023-08-08 DIAGNOSIS — S0990XA Unspecified injury of head, initial encounter: Secondary | ICD-10-CM | POA: Diagnosis not present

## 2023-08-08 DIAGNOSIS — F10920 Alcohol use, unspecified with intoxication, uncomplicated: Secondary | ICD-10-CM

## 2023-08-08 DIAGNOSIS — R519 Headache, unspecified: Secondary | ICD-10-CM | POA: Diagnosis not present

## 2023-08-08 DIAGNOSIS — R270 Ataxia, unspecified: Secondary | ICD-10-CM | POA: Diagnosis not present

## 2023-08-08 DIAGNOSIS — S199XXA Unspecified injury of neck, initial encounter: Secondary | ICD-10-CM | POA: Diagnosis not present

## 2023-08-08 HISTORY — DX: Alcohol dependence, uncomplicated: F10.20

## 2023-08-08 LAB — CBC WITH DIFFERENTIAL/PLATELET
Abs Immature Granulocytes: 0.03 10*3/uL (ref 0.00–0.07)
Basophils Absolute: 0.1 10*3/uL (ref 0.0–0.1)
Basophils Relative: 1 %
Eosinophils Absolute: 0.1 10*3/uL (ref 0.0–0.5)
Eosinophils Relative: 1 %
HCT: 45.3 % (ref 39.0–52.0)
Hemoglobin: 15.4 g/dL (ref 13.0–17.0)
Immature Granulocytes: 0 %
Lymphocytes Relative: 25 %
Lymphs Abs: 2.9 10*3/uL (ref 0.7–4.0)
MCH: 31.6 pg (ref 26.0–34.0)
MCHC: 34 g/dL (ref 30.0–36.0)
MCV: 93 fL (ref 80.0–100.0)
Monocytes Absolute: 0.7 10*3/uL (ref 0.1–1.0)
Monocytes Relative: 6 %
Neutro Abs: 7.9 10*3/uL — ABNORMAL HIGH (ref 1.7–7.7)
Neutrophils Relative %: 67 %
Platelets: 158 10*3/uL (ref 150–400)
RBC: 4.87 MIL/uL (ref 4.22–5.81)
RDW: 14.4 % (ref 11.5–15.5)
WBC: 11.8 10*3/uL — ABNORMAL HIGH (ref 4.0–10.5)
nRBC: 0 % (ref 0.0–0.2)

## 2023-08-08 LAB — ETHANOL: Alcohol, Ethyl (B): 379 mg/dL (ref <10)

## 2023-08-08 LAB — COMPREHENSIVE METABOLIC PANEL
ALT: 42 U/L (ref 0–44)
AST: 63 U/L — ABNORMAL HIGH (ref 15–41)
Albumin: 5 g/dL (ref 3.5–5.0)
Alkaline Phosphatase: 85 U/L (ref 38–126)
Anion gap: 21 — ABNORMAL HIGH (ref 5–15)
BUN: 18 mg/dL (ref 6–20)
CO2: 26 mmol/L (ref 22–32)
Calcium: 9.2 mg/dL (ref 8.9–10.3)
Chloride: 95 mmol/L — ABNORMAL LOW (ref 98–111)
Creatinine, Ser: 1.04 mg/dL (ref 0.61–1.24)
GFR, Estimated: >60 mL/min (ref >60)
Glucose, Bld: 101 mg/dL — ABNORMAL HIGH (ref 70–99)
Potassium: 4.5 mmol/L (ref 3.5–5.1)
Sodium: 142 mmol/L (ref 135–145)
Total Bilirubin: 1 mg/dL (ref <1.2)
Total Protein: 8.3 g/dL — ABNORMAL HIGH (ref 6.5–8.1)

## 2023-08-08 NOTE — Discharge Instructions (Addendum)
Try to stay well hydrated and return for any other concerns.

## 2023-08-08 NOTE — ED Provider Triage Note (Signed)
Emergency Medicine Provider Triage Evaluation Note  Joshua Medina , a 37 y.o. male  was evaluated in triage.  Pt complains of here via PD for medical clearance before going to jail. Patient admits to drinking 1/2 bottle of voka today.  Drinks every day.   No complaints of pain.   Review of Systems  Positive: +etoh Negative:   Physical Exam  There were no vitals taken for this visit. Gen:   Awake, no distress  Alert, talking Resp:  Normal effort  MSK:   Moves extremities without difficulty.  Ambulates without assistance.   Other:    Medical Decision Making  Medically screening exam initiated at 11:29 AM.  Appropriate orders placed.  Apollos Poveromo was informed that the remainder of the evaluation will be completed by another provider, this initial triage assessment does not replace that evaluation, and the importance of remaining in the ED until their evaluation is complete.     Tommi Rumps, PA-C 08/08/23 1134

## 2023-08-08 NOTE — ED Provider Notes (Signed)
[facility] Provider Note    Event Date/Time   First MD Initiated Contact with Patient 08/08/23 1355     (approximate)   History   Alcohol Intoxication   HPI  Alem Fine is a 37 y.o. male with history of alcohol use he comes in with concerns for alcohol abuse.  Patient reports that he drinks a lot of alcohol.  He states that he is unsure if he fell on the ground.  Does report a headache.  He states that he does not really remember what happened.  He denies any chest wall pain, abdominal pain or other concerns  Physical Exam   Triage Vital Signs: ED Triage Vitals  Encounter Vitals Group     BP 08/08/23 1133 116/84     Systolic BP Percentile --      Diastolic BP Percentile --      Pulse Rate 08/08/23 1133 95     Resp 08/08/23 1133 18     Temp 08/08/23 1133 97.9 F (36.6 C)     Temp Source 08/08/23 1133 Oral     SpO2 08/08/23 1133 99 %     Weight --      Height --      Head Circumference --      Peak Flow --      Pain Score 08/08/23 1134 0     Pain Loc --      Pain Education --      Exclude from Growth Chart --     Most recent vital signs: Vitals:   08/08/23 1133  BP: 116/84  Pulse: 95  Resp: 18  Temp: 97.9 F (36.6 C)  SpO2: 99%     General: Awake, no distress.  CV:  Good peripheral perfusion.  Resp:  Normal effort.  Abd:  No distention.  Soft and nontender Other:  Moving all extremities well.   ED Results / Procedures / Treatments   Labs (all labs ordered are listed, but only abnormal results are displayed) Labs Reviewed  CBC WITH DIFFERENTIAL/PLATELET - Abnormal; Notable for the following components:      Result Value   WBC 11.8 (*)    Neutro Abs 7.9 (*)    All other components within normal limits  COMPREHENSIVE METABOLIC PANEL - Abnormal; Notable for the following components:   Chloride 95 (*)    Glucose, Bld 101 (*)    Total Protein 8.3 (*)    AST 63 (*)    Anion gap 21 (*)    All other components within normal  limits  ETHANOL - Abnormal; Notable for the following components:   Alcohol, Ethyl (B) 379 (*)    All other components within normal limits     RADIOLOGY I reviewed CT head personally interpreted no evidence of intracranial hemorrhage   PROCEDURES:  Critical Care performed: No  Procedures   MEDICATIONS ORDERED IN ED: Medications - No data to display   IMPRESSION / MDM / ASSESSMENT AND PLAN / ED COURSE  I reviewed the triage vital signs and the nursing notes.   Patient's presentation is most consistent with acute presentation with potential threat to life or bodily function.    Patient comes in with concern for alcohol intoxication.  The please officer who originally a resident was not there to give me any collateral information on the story involving why patient was arrested.  He states that he is having a headache and is unsure if he had a fall.  No  obvious signs of trauma but given he is intoxicated will get CT head CT cervical evaluate for intracranial hemorrhage, cervical fracture.  Basic labs ordered by triage.  EtOH significantly elevated at 379.  CBC shows slight white count elevation but patient is afebrile.  CMP shows slight elevation of AST similar to prior.  Anion gap is elevated suspect related to dehydration.  Will encourage p.o. hydration.  Patient handed off pending CT imaging    FINAL CLINICAL IMPRESSION(S) / ED DIAGNOSES   Final diagnoses:  Alcoholic intoxication without complication (HCC)     Rx / DC Orders   ED Discharge Orders     None        Note:  This document was prepared using Dragon voice recognition software and may include unintentional dictation errors.   Concha Se, MD 08/08/23 [phone]

## 2023-08-08 NOTE — ED Notes (Signed)
MD Mumma informed of etoh 379

## 2023-08-08 NOTE — ED Triage Notes (Signed)
Patient brought in under custody by [facility] PD in bilateral wrist handcuffs; patient needs medical clearance for jail. Patient has no complaints but admits to ETOH abuse today.

## 2023-09-19 NOTE — Congregational Nurse Program (Signed)
  Dept: [phone]   Congregational Nurse Program Note  Date of Encounter: 09/19/2023 Client to [facility] day center nurse led clinic with request for assistance in renewing his EBT. Application completed. [employer] bus passes given for client to take the application to [facility]. RN also provided education regarding the need for follow up labs as he has been on synthroid for several months with out a lab values. Client voiced understanding. Madolyn Frieze Past Medical History: Past Medical History:  Diagnosis Date   Alcoholic (HCC)    Diabetes mellitus without complication (HCC)    Graves disease    Thyroid disease     Encounter Details:  Community Questionnaire - 08/29/23 1010       Questionnaire   Ask client: Do you give verbal consent for me to treat you today? Yes    Student Assistance N/A    Location Patient Served  [facility]    Encounter Setting CN site    Population Status Unhoused   working with a program through the [employer] on housing   [employer]   Client has [employer] [facility] for primary and mental health   Insurance/Financial Assistance Referral N/A    Medication Have Medication Insecurities   labs to be done 8/13, client expects to be prescribed synthroid   Medical Provider Yes   [facility] Dr. Celedonio Miyamoto   Screening Referrals Made N/A    Medical Referrals Made N/A    Medical Appointment Completed N/A    CNP Interventions Advocate/Support;Case Management   assisted with checking on Rx   Screenings CN Performed N/A    ED Visit Averted N/A    Life-Saving Intervention Made N/A

## 2023-11-24 ENCOUNTER — Emergency Department: Payer: MEDICAID

## 2023-11-24 ENCOUNTER — Other Ambulatory Visit: Payer: Self-pay

## 2023-11-24 ENCOUNTER — Emergency Department
Admission: EM | Admit: 2023-11-24 | Discharge: 2023-11-24 | Disposition: A | Discharge status: Home / Self Care | Payer: MEDICAID | Attending: Emergency Medicine | Admitting: Emergency Medicine

## 2023-11-24 DIAGNOSIS — Y92838 Other recreation area as the place of occurrence of the external cause: Secondary | ICD-10-CM | POA: Diagnosis not present

## 2023-11-24 DIAGNOSIS — S0081XA Abrasion of other part of head, initial encounter: Secondary | ICD-10-CM | POA: Insufficient documentation

## 2023-11-24 DIAGNOSIS — S60511A Abrasion of right hand, initial encounter: Secondary | ICD-10-CM | POA: Insufficient documentation

## 2023-11-24 DIAGNOSIS — Y908 Blood alcohol level of 240 mg/100 ml or more: Secondary | ICD-10-CM | POA: Insufficient documentation

## 2023-11-24 DIAGNOSIS — F10120 Alcohol abuse with intoxication, uncomplicated: Secondary | ICD-10-CM | POA: Diagnosis present

## 2023-11-24 DIAGNOSIS — Z5900 Homelessness unspecified: Secondary | ICD-10-CM | POA: Insufficient documentation

## 2023-11-24 DIAGNOSIS — E119 Type 2 diabetes mellitus without complications: Secondary | ICD-10-CM | POA: Diagnosis not present

## 2023-11-24 DIAGNOSIS — F10920 Alcohol use, unspecified with intoxication, uncomplicated: Secondary | ICD-10-CM

## 2023-11-24 DIAGNOSIS — W1830XA Fall on same level, unspecified, initial encounter: Secondary | ICD-10-CM | POA: Diagnosis not present

## 2023-11-24 LAB — COMPREHENSIVE METABOLIC PANEL WITH GFR
ALT: 23 U/L (ref 0–44)
AST: 38 U/L (ref 15–41)
Albumin: 4.4 g/dL (ref 3.5–5.0)
Alkaline Phosphatase: 67 U/L (ref 38–126)
Anion gap: 11 (ref 5–15)
BUN: 12 mg/dL (ref 6–20)
CO2: 22 mmol/L (ref 22–32)
Calcium: 8.7 mg/dL — ABNORMAL LOW (ref 8.9–10.3)
Chloride: 108 mmol/L (ref 98–111)
Creatinine, Ser: 1.22 mg/dL (ref 0.61–1.24)
GFR, Estimated: >60 mL/min (ref >60)
Glucose, Bld: 94 mg/dL (ref 70–99)
Potassium: 3.7 mmol/L (ref 3.5–5.1)
Sodium: 141 mmol/L (ref 135–145)
Total Bilirubin: 0.4 mg/dL (ref 0.0–1.2)
Total Protein: 7.4 g/dL (ref 6.5–8.1)

## 2023-11-24 LAB — CBC
HCT: 48.3 % (ref 39.0–52.0)
Hemoglobin: 16.1 g/dL (ref 13.0–17.0)
MCH: 32.8 pg (ref 26.0–34.0)
MCHC: 33.3 g/dL (ref 30.0–36.0)
MCV: 98.4 fL (ref 80.0–100.0)
Platelets: 117 10*3/uL — ABNORMAL LOW (ref 150–400)
RBC: 4.91 MIL/uL (ref 4.22–5.81)
RDW: 15.4 % (ref 11.5–15.5)
WBC: 10.3 10*3/uL (ref 4.0–10.5)
nRBC: 0 % (ref 0.0–0.2)

## 2023-11-24 LAB — ETHANOL: Alcohol, Ethyl (B): 415 mg/dL (ref <10)

## 2023-11-24 MED ORDER — SODIUM CHLORIDE 0.9 % IV BOLUS
1000.0000 mL | Freq: Once | INTRAVENOUS | Status: AC
  Administered 2023-11-24: 1000 mL via INTRAVENOUS
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
## 2023-11-24 NOTE — ED Notes (Signed)
 Pt placed on Bair hugger

## 2023-11-24 NOTE — ED Notes (Signed)
 Pt given dry paper scrubs and socks.
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
## 2023-11-27 NOTE — Congregational Nurse Program (Signed)
  Dept: [phone]   Congregational Nurse Program Note  Date of Encounter: 11/27/2023 Client to [facility] day center, nurse led clinic at request of this RN. Client was seen over the weekend at [facility] ER. Abrasion over left eye cleansed. Dried blood removed. Antibiotic ointment applied. Edema surrounding the left eye, client denied pain. Emotional support and active listening provided. Client is not at this time interested in rehab. RN to continue to provide support. Madolyn Frieze Past Medical History: Past Medical History:  Diagnosis Date   Alcoholic (HCC)    Diabetes mellitus without complication (HCC)    Graves disease    Thyroid disease     Encounter Details:  Community Questionnaire - 11/27/23 1257       Questionnaire   Ask client: Do you give verbal consent for me to treat you today? Yes    Student Assistance N/A    Location Patient Served  [facility]    Encounter Setting CN site    Population Status Unhoused   working with a program through the [employer] on housing   [employer]   Client has [employer] [employer] [facility] for primary and mental health   Insurance/Financial Assistance Referral N/A    Medication Have Medication Insecurities   labs to be done 8/13, client expects to be prescribed synthroid   Medical Provider Yes   [facility] Dr. Celedonio Miyamoto   Screening Referrals Made N/A    Medical Referrals Made N/A    Medical Appointment Completed N/A    CNP Interventions Advocate/Support;Case Management   assisted with checking on Rx   Screenings CN Performed N/A    ED Visit Averted N/A    Life-Saving Intervention Made N/A

## 2023-12-07 ENCOUNTER — Other Ambulatory Visit: Payer: Self-pay

## 2023-12-07 ENCOUNTER — Emergency Department
Admission: EM | Admit: 2023-12-07 | Discharge: 2023-12-08 | Disposition: A | Discharge status: Home / Self Care | Payer: MEDICAID | Attending: Emergency Medicine | Admitting: Emergency Medicine

## 2023-12-07 DIAGNOSIS — Y908 Blood alcohol level of 240 mg/100 ml or more: Secondary | ICD-10-CM | POA: Insufficient documentation

## 2023-12-07 DIAGNOSIS — F10120 Alcohol abuse with intoxication, uncomplicated: Secondary | ICD-10-CM | POA: Diagnosis present

## 2023-12-07 DIAGNOSIS — F10920 Alcohol use, unspecified with intoxication, uncomplicated: Secondary | ICD-10-CM

## 2023-12-07 LAB — CBC
HCT: 49.1 % (ref 39.0–52.0)
Hemoglobin: 16.3 g/dL (ref 13.0–17.0)
MCH: 32.3 pg (ref 26.0–34.0)
MCHC: 33.2 g/dL (ref 30.0–36.0)
MCV: 97.2 fL (ref 80.0–100.0)
Platelets: 175 10*3/uL (ref 150–400)
RBC: 5.05 MIL/uL (ref 4.22–5.81)
RDW: 15.7 % — ABNORMAL HIGH (ref 11.5–15.5)
WBC: 7.3 10*3/uL (ref 4.0–10.5)
nRBC: 0 % (ref 0.0–0.2)

## 2023-12-07 LAB — COMPREHENSIVE METABOLIC PANEL WITH GFR
ALT: 37 U/L (ref 0–44)
AST: 70 U/L — ABNORMAL HIGH (ref 15–41)
Albumin: 5.2 g/dL — ABNORMAL HIGH (ref 3.5–5.0)
Alkaline Phosphatase: 99 U/L (ref 38–126)
Anion gap: 19 — ABNORMAL HIGH (ref 5–15)
BUN: 16 mg/dL (ref 6–20)
CO2: 27 mmol/L (ref 22–32)
Calcium: 9.4 mg/dL (ref 8.9–10.3)
Chloride: 100 mmol/L (ref 98–111)
Creatinine, Ser: 1.43 mg/dL — ABNORMAL HIGH (ref 0.61–1.24)
GFR, Estimated: >60 mL/min (ref >60)
Glucose, Bld: 91 mg/dL (ref 70–99)
Potassium: 3.9 mmol/L (ref 3.5–5.1)
Sodium: 146 mmol/L — ABNORMAL HIGH (ref 135–145)
Total Bilirubin: 0.8 mg/dL (ref 0.0–1.2)
Total Protein: 8.4 g/dL — ABNORMAL HIGH (ref 6.5–8.1)

## 2023-12-07 LAB — ETHANOL: Alcohol, Ethyl (B): 393 mg/dL (ref <10)

## 2023-12-07 NOTE — Discharge Instructions (Addendum)
 You were seen in the emergency department for alcohol intoxication.  Please seek help from the recommended resources for assistance with your alcohol dependence.  Please avoid drug and alcohol use.  Never drive a vehicle or operate machinery while intoxicated.

## 2023-12-07 NOTE — ED Provider Notes (Signed)
-----------------------------------------   11:54 PM on 12/07/2023 -----------------------------------------  Assuming care from Dr. Alejo Amsler.  In short, Joshua Medina is a 38 y.o. male with a chief complaint of ETOH.  Refer to the original H&P for additional details.  The current plan of care is to metabolize and discharge when appropriate.   Clinical Course as of 12/08/23 9147  Sat Dec 08, 2023  0609 Patient is awake and alert, sitting up, tolerated PO.  No evidence of withdrawal.  Patient agreed he is ready to go.  Providing outpatient resources. [CF]    Clinical Course User Index [CF] Lynnda Sas, MD     Medications - No data to display   ED Discharge Orders     None      Final diagnoses:  Alcoholic intoxication without complication [facility])     Lynnda Sas, MD 12/08/23 [phone]

## 2023-12-07 NOTE — ED Provider Notes (Signed)
 [facility] Provider Note   Event Date/Time   First MD Initiated Contact with Patient 12/07/23 2125     (approximate) History  Alcohol Intoxication  HPI Joshua Medina is a 38 y.o. male with a past medical history of alcohol abuse who presents via EMS from [facility] requesting resources for detoxification.  Patient states that he drinks vodka daily and does not eat.  Patient states that he has been nauseous and requests something to eat upon arrival.  Patient denies any other complaints at this time ROS: Patient currently denies any vision changes, tinnitus, difficulty speaking, facial droop, sore throat, chest pain, shortness of breath, abdominal pain, nausea/vomiting/diarrhea, dysuria, or weakness/numbness/paresthesias in any extremity   Physical Exam  Triage Vital Signs: ED Triage Vitals  Encounter Vitals Group     BP 12/07/23 2131 107/81     Systolic BP Percentile --      Diastolic BP Percentile --      Pulse Rate 12/07/23 2131 71     Resp 12/07/23 2131 18     Temp 12/07/23 2131 97.8 F (36.6 C)     Temp Source 12/07/23 2131 Oral     SpO2 12/07/23 2131 100 %     Weight 12/07/23 2131 160 lb 7.9 oz (72.8 kg)     Height 12/07/23 2131 5\' 10"  (1.778 m)     Head Circumference --      Peak Flow --      Pain Score 12/07/23 2127 0     Pain Loc --      Pain Education --      Exclude from Growth Chart --    Most recent vital signs: Vitals:   12/07/23 2131  BP: 107/81  Pulse: 71  Resp: 18  Temp: 97.8 F (36.6 C)  SpO2: 100%   General: Awake, oriented x4. CV:  Good peripheral perfusion.  Resp:  Normal effort.  Abd:  No distention.  Other:  Disheveled middle-aged Caucasian male resting comfortably in no acute distress ED Results / Procedures / Treatments  Labs (all labs ordered are listed, but only abnormal results are displayed) Labs Reviewed  COMPREHENSIVE METABOLIC PANEL WITH GFR - Abnormal; Notable for the following components:      Result Value    Sodium 146 (*)    Creatinine, Ser 1.43 (*)    Total Protein 8.4 (*)    Albumin  5.2 (*)    AST 70 (*)    Anion gap 19 (*)    All other components within normal limits  ETHANOL - Abnormal; Notable for the following components:   Alcohol, Ethyl (B) 393 (*)    All other components within normal limits  CBC - Abnormal; Notable for the following components:   RDW 15.7 (*)    All other components within normal limits  URINE DRUG SCREEN, QUALITATIVE ([facility] ONLY)   PROCEDURES: Critical Care performed: No Procedures MEDICATIONS ORDERED IN ED: Medications - No data to display IMPRESSION / MDM / ASSESSMENT AND PLAN / ED COURSE  I reviewed the triage vital signs and the nursing notes.                             The patient is on the cardiac monitor to evaluate for evidence of arrhythmia and/or significant heart rate changes. Patient's presentation is most consistent with acute presentation with potential threat to life or bodily function. Presents with altered mental status. +Slurred, sluggish behavior. Stated  EtOH intoxication. Airway maintained. Unlikely intracranial bleed, opioid intoxication or coingestion, sepsis, hypothyroidism. Suspect likely transient course of intoxication with expected  improvement of symptoms as patient metabolizes offending agent.  Plan: frequent reassessments  Care of this patient will be signed out to the oncoming physician at the end of my shift.  All pertinent patient information conveyed and all questions answered.  All further care and disposition decisions will be made by the oncoming physician.    FINAL CLINICAL IMPRESSION(S) / ED DIAGNOSES   Final diagnoses:  Alcoholic intoxication without complication (HCC)   Rx / DC Orders   ED Discharge Orders     None      Note:  This document was prepared using Dragon voice recognition software and may include unintentional dictation errors.   Yuritza Paulhus K, MD 12/07/23 [phone]

## 2023-12-07 NOTE — ED Triage Notes (Addendum)
 Pt to ED via [facility] from [facility] for ETOH and wanting help with detoxing. Pt has been drinking vodka all day with no food intake. Pt told EMS he was nauseas and wanted something to eat.   All pt's vitals were within normal limits. CBG was 75.   Pt stated he takes no medications daily.

## 2023-12-08 LAB — URINE DRUG SCREEN, QUALITATIVE (ARMC ONLY)
Amphetamines, Ur Screen: NOT DETECTED
Barbiturates, Ur Screen: NOT DETECTED
Benzodiazepine, Ur Scrn: NOT DETECTED
Cannabinoid 50 Ng, Ur ~~LOC~~: NOT DETECTED
Cocaine Metabolite,Ur ~~LOC~~: NOT DETECTED
MDMA (Ecstasy)Ur Screen: NOT DETECTED
Methadone Scn, Ur: NOT DETECTED
Opiate, Ur Screen: NOT DETECTED
Phencyclidine (PCP) Ur S: NOT DETECTED
Tricyclic, Ur Screen: NOT DETECTED

## 2023-12-08 NOTE — ED Notes (Signed)
 Pt woke and requested to use bathroom.  RN provided pt a urinal and brought pt to bathroom.  Pt returned to bed.  MD made aware that pt is alert and awake in bed.

## 2024-02-12 IMAGING — DX DG ANKLE COMPLETE 3+V*R*
3 series · 3 of 3 positions shown · non-contrast
Comparison: None.

CLINICAL DATA: Right ankle pain

EXAM:
RIGHT ANKLE - COMPLETE 3+ VIEW

[ankle obl (1 of 2)]
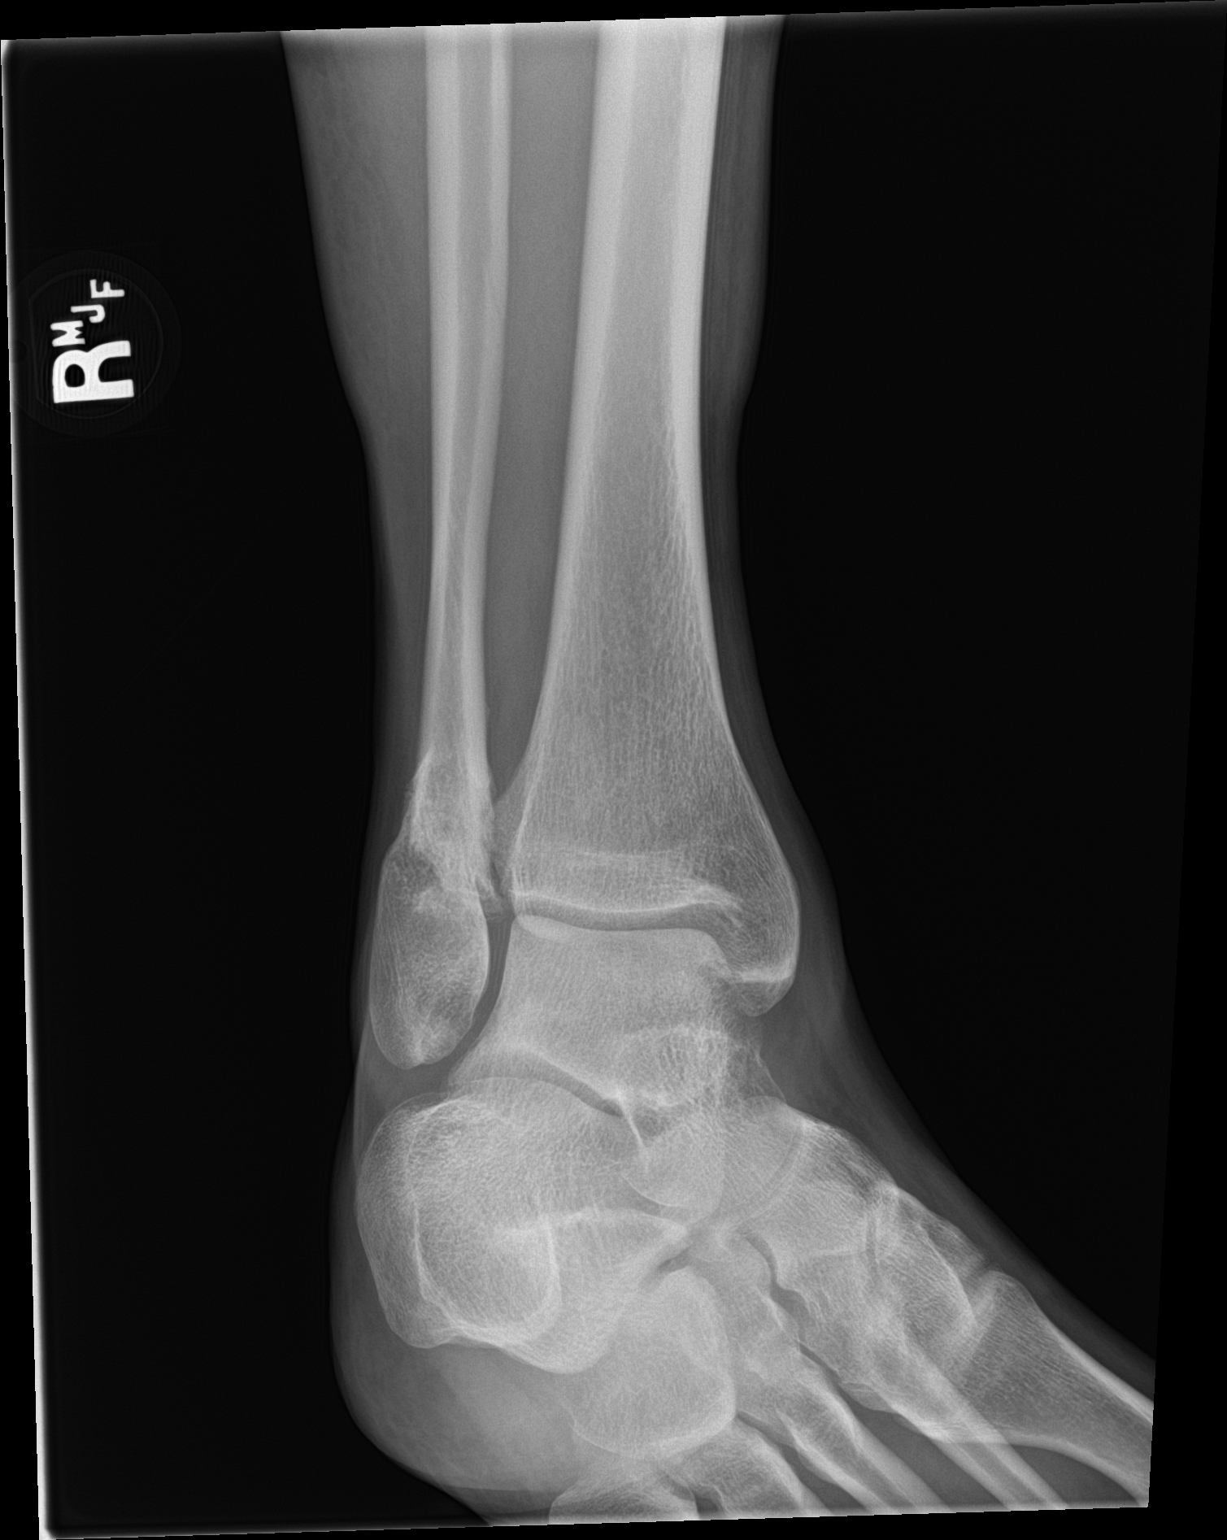

[ankle obl (2 of 2)]
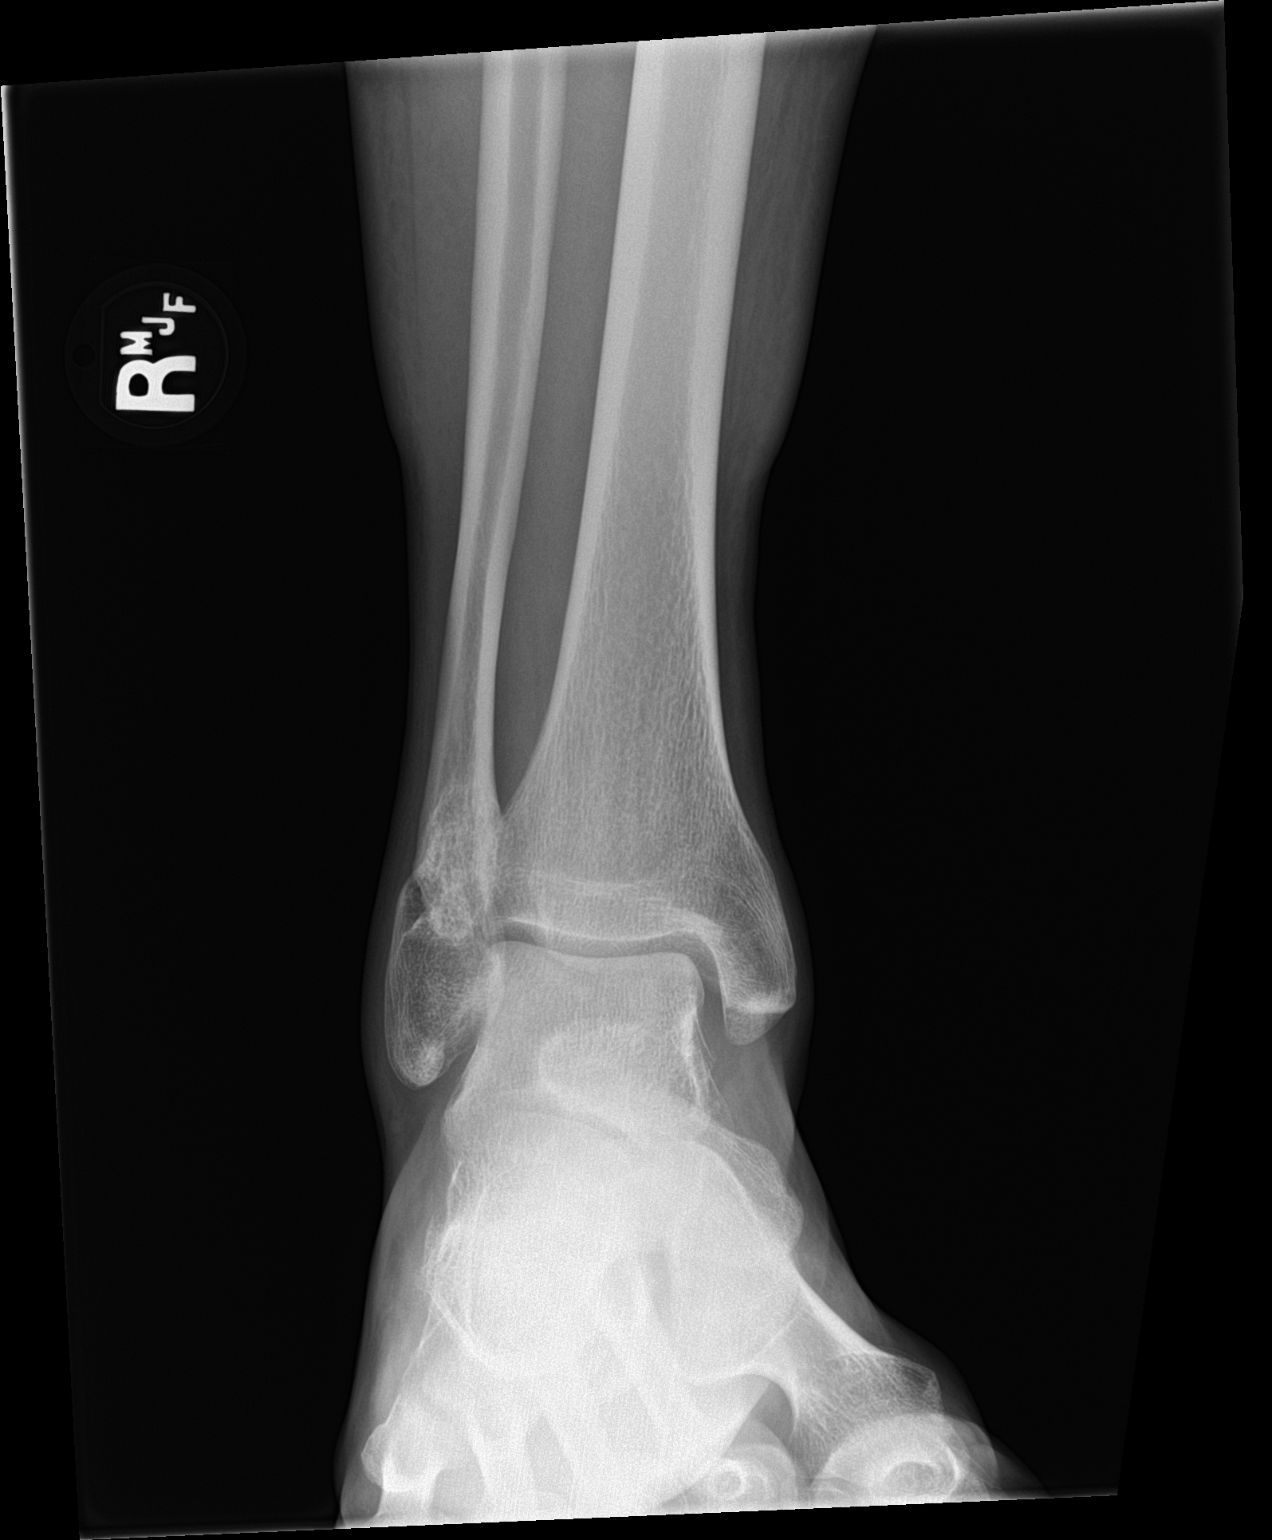

[ankle lat]
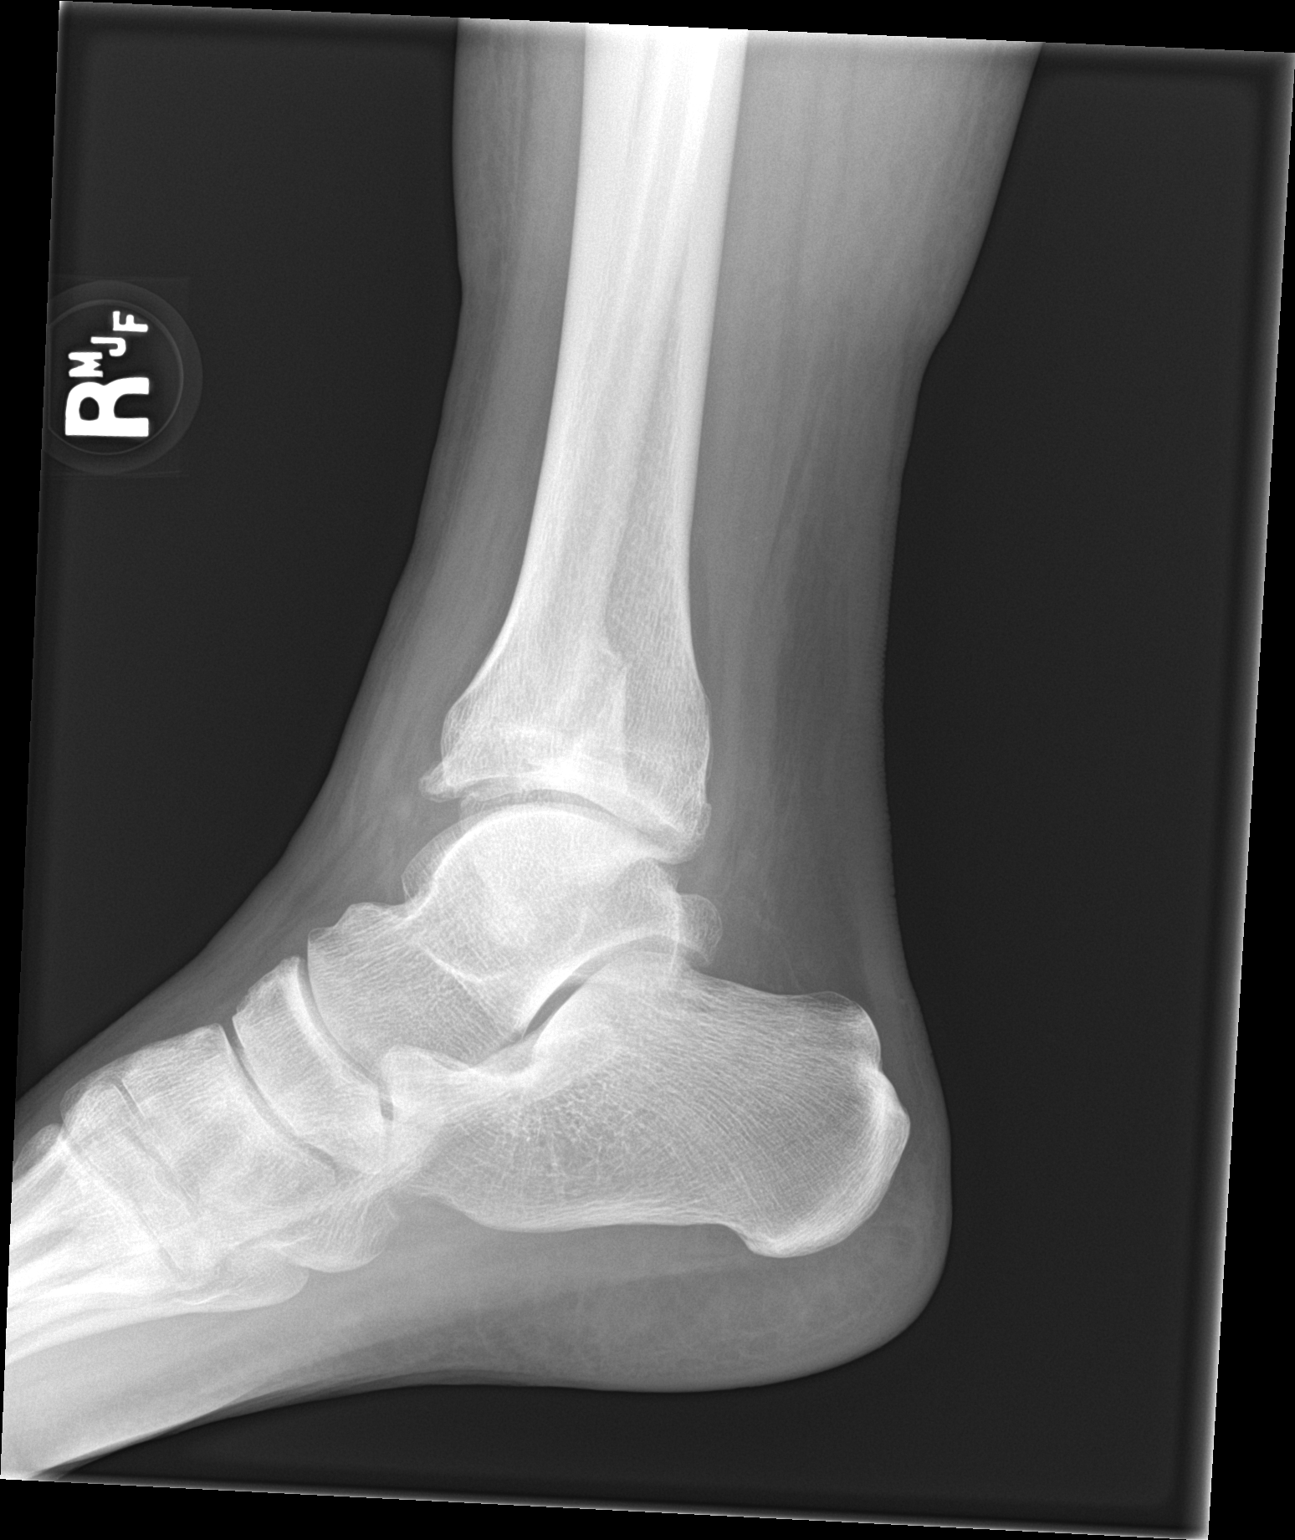

[3 of 3 positions shown; findings below may reference images not displayed]

FINDINGS: Probable old right distal fibular fracture noted at the level of the
ankle mortise. On the lateral view, there appears to be partial
nonunion. No tibial abnormality. No acute fracture, subluxation or
dislocation.
IMPRESSION: Chronic appearing distal right fibular fracture with nonunion.

## 2024-04-30 ENCOUNTER — Telehealth: Payer: MEDICAID | Admitting: Nurse Practitioner

## 2024-04-30 ENCOUNTER — Other Ambulatory Visit: Payer: Self-pay

## 2024-04-30 VITALS — BP 104/72 | HR 76 | Resp 16 | Ht 70.47 in | Wt 160.4 lb

## 2024-04-30 DIAGNOSIS — E890 Postprocedural hypothyroidism: Secondary | ICD-10-CM

## 2024-04-30 DIAGNOSIS — E039 Hypothyroidism, unspecified: Secondary | ICD-10-CM

## 2024-04-30 MED ORDER — LEVOTHYROXINE SODIUM 75 MCG PO TABS
75.0000 ug | ORAL_TABLET | Freq: Every day | ORAL | 0 refills | Status: AC
  Filled 2024-04-30: qty 90, 90d supply, fill #0

## 2024-04-30 NOTE — Progress Notes (Signed)
 Pt present for thyroid  check

## 2024-04-30 NOTE — Congregational Nurse Program (Signed)
  Dept: [phone]   Congregational Nurse Program Note  Date of Encounter: 04/30/2024 Client to [facility] day Center, nurse led clinic with request for vital signs. RN also spoke with client about restarting his Synthroid . He has not seen his PCP since 10/24. RN contacted the [facility] as there is an NP there today. Lauraine Kitty. He can be seen there today. Client given [facility] bus passes. If he is prescribed medication, prescriptions to be called to [facility]. Client is aware that he maybe ordered labs, which can be done at the [facility] as well. Client given written information regarding this appointment. MARLA Marina BSN, RN Past Medical History: Past Medical History:  Diagnosis Date   Alcoholic (HCC)    Diabetes mellitus without complication (HCC)    Graves disease    Thyroid  disease     Encounter Details:  Community Questionnaire - 04/30/24 0950       Questionnaire   Ask client: Do you give verbal consent for me to treat you today? Yes    Student Assistance N/A    Location Patient Served  [facility]    Encounter Setting CN site    Population Status Unhoused   working with a program through the [employer] on housing   [employer]   Client has [employer] [facility] for primary and mental health   Insurance/Financial Assistance Referral N/A    Medication Have Medication Insecurities   labs to be done 8/13, client expects to be prescribed synthroid    Medical Provider Yes   [facility] Aureliano   Screening Referrals Made N/A    Medical Referrals Made Non-[facility] PCP/Clinic    Medical Appointment Completed Non-[facility] PCP/Clinic    CNP Interventions Advocate/Support;Case Management   assisted with checking on Rx   Screenings CN Performed N/A    ED Visit Averted N/A    Life-Saving Intervention Made N/A

## 2024-04-30 NOTE — Progress Notes (Signed)
 Virtual Primary Care Telehealth Visit  Virtual Visit Consent  Joshua Medina, you are scheduled for a virtual visit with a [place] provider today. Just as with appointments in the office, your consent must be obtained to participate. Your consent will be active for this visit and any virtual visit you may have with one of our providers in the next 365 days. If you have a MyChart account, a copy of this consent can be sent to you electronically.  By engaging in this virtual visit, you consent to the provision of healthcare and authorize for your insurance to be billed (if applicable) for the services provided during this visit. Depending on your insurance coverage, you may receive a charge related to this service.  I need to obtain your verbal consent now. Are you willing to proceed with your visit today? Jagdeep Ancheta has provided verbal consent on 04/30/2024 for a virtual visit (via [employer]). Lauraine Kitty, FNP  Date: 04/30/2024 11:52 AM  Virtual Visit via Video Note   I, Lauraine Kitty, connected with  Joshua Medina  ([id number], [birth year]) on 04/30/24 at  1:00 PM EDT by a video-enabled telemedicine application and verified that I am speaking with the correct person using two identifiers.  Telepresenter, Berlinda Pouch, present for entirety of visit to assist with video functionality and physical examination via [employer] device.   This is an initial visit to discuss the opportunity to become a primary care patient at [facility]  The patient understands that if their medical background is complex, their case will be reviewed with the Medical Director, and if [facility] is not the ideal location for their care, our team will help establish the patient with a primary care provider in the area.   If this is determined that this location is not the best option for the patient, in the future if the patient's medical condition changes we can re explore the option of this location serving as  their primary care location.  The patient understands that by becoming a primary care patient, this would be the location for their primary care, and if they chose to leave this location and seek primary care services at another location they will not be able to continue their relationship with this clinic.    Location: Patient: Virtual Visit Location Patient: VPC visit location: [facility]  Provider: Virtual Visit Location Provider: Home Office   History of Present Illness:  Joshua Medina is a 38 y.o. who identifies as a male who was assigned male at birth, and is being seen today as a new patient with the [facility]. Patient was referred from [facility] Nursing Program at [facility]. His history is significant for Grave's Disease/ Remote history of DM that is controlled with diet he has not required medication in the past.   He is a current patient Dr. Albina at [facility] but has not been to the office in one year. Had CPE in October of last year.   He has had a difficult time sleeping in the past but denies need for medication for assistance. He would like to simply address his thyroid  today and no other issues   Do you have a current Primary Care Provider? Yes Have you seen a Primary Care Provider in the past? No  Do you have a history of Diabetes No Do you have a history of HTN No Do you have a history of high cholesterol? No Have you been diagnosed with kidney disease  or kidney failure in the past? No Have you been diagnosed with an autoimmune disease? No Have you been diagnosed with sleep apnea? No  Do you see any specialists currently No  Have you had surgery in the past Yes  Do you take any hormone replacement therapy No   Problems:  Patient Active Problem List   Diagnosis Date Noted   Gastritis and gastroduodenitis    Mallory-Weiss tear    GI bleed 04/18/2022   Hematemesis with nausea    UGIB (upper gastrointestinal bleed)     Altered mental status 01/11/2021   Hyponatremia 01/11/2021   Hypotension 06/10/2020   Hypoglycemia without diagnosis of diabetes mellitus 06/10/2020   Alcohol withdrawal seizure (HCC) 06/04/2020   Malnutrition of moderate degree 02/17/2020   Hypomagnesemia 02/16/2020   Delirium tremens (HCC) 02/16/2020   Alcohol use disorder, moderate, dependence (HCC) 02/15/2020   Hypothyroidism (acquired) 02/15/2020   Alcoholic liver disease (HCC) 02/15/2020   Alcohol withdrawal seizure with complication, with delirium (HCC) 02/15/2020   Alcohol withdrawal seizure, with delirium (HCC) 02/15/2020   Thrombocytopenia (HCC) 02/15/2020   Anemia 02/15/2020   Hypokalemia 02/15/2020    Allergies:  Allergies  Allergen Reactions   Precedex  [Dexmedetomidine  Hcl In Nacl] Other (See Comments)   Prednisone    Medications:  Current Outpatient Medications:    HYDROcodone -acetaminophen  (NORCO) 5-325 MG tablet, Take 1 tablet by mouth every 6 (six) hours as needed for moderate pain. (Patient not taking: Reported on 03/12/2023), Disp: 5 tablet, Rfl: 0   levothyroxine  (SYNTHROID ) 75 MCG tablet, Take 1 tablet (75 mcg total) by mouth daily., Disp: 90 tablet, Rfl: 0  Observations/Objective:  Physical Exam: Awake alert and oriented  In no acute distress Lung sounds clear throughout  RRR Mood normal   Today's Vitals   04/30/24 1152  BP: 104/72  Pulse: 76  Resp: 16  SpO2: 96%  Weight: 160 lb 6.4 oz (72.8 kg)  Height: 5' 10.47 (1.79 m)   Body mass index is 22.71 kg/m.   Assessment and Plan:  Offered further workup for underlying disease and existing care gaps  Patient declined for today, would like to restart his thyroid  medicine and is working with [employer] with SDOH needs  He will return in three months for follow up, earlier with any new concerns  Will follow up with lab results tomorrow in collaboration with CCNP   1. Postablative hypothyroidism (Primary) - Thyroid  Panel With TSH - levothyroxine   (SYNTHROID ) 75 MCG tablet; Take 1 tablet (75 mcg total) by mouth daily.  Dispense: 90 tablet; Refill: 0  2. Hypothyroidism (acquired) - levothyroxine  (SYNTHROID ) 75 MCG tablet; Take 1 tablet (75 mcg total) by mouth daily.  Dispense: 90 tablet; Refill: 0    Follow Up Instructions: I discussed the assessment and treatment plan with the patient. The Telepresenter provided patient with a physical copy of my written instructions for review.   The patient was advised to call back or seek an in-person evaluation if the symptoms worsen or if the condition fails to improve as anticipated.    Lauraine Kitty, FNP  **Disclaimer: This note may have been dictated with voice recognition software. Similar sounding words can inadvertently be transcribed and this note may contain transcription errors which may not have been corrected upon publication of note.**

## 2024-05-01 ENCOUNTER — Ambulatory Visit: Payer: Self-pay | Admitting: Nurse Practitioner

## 2024-05-01 LAB — THYROID PANEL WITH TSH
Free Thyroxine Index: 0.1 — ABNORMAL LOW (ref 1.2–4.9)
T3 Uptake Ratio: 17 % — ABNORMAL LOW (ref 24–39)
T4, Total: 0.8 ug/dL — CL (ref 4.5–12.0)
TSH: 53.3 u[IU]/mL — ABNORMAL HIGH (ref 0.450–4.500)

## 2024-05-29 ENCOUNTER — Emergency Department
Admission: EM | Admit: 2024-05-29 | Discharge: 2024-05-29 | Disposition: A | Discharge status: Other Acute Inpatient Hospital | Payer: MEDICAID | Attending: Emergency Medicine | Admitting: Emergency Medicine

## 2024-05-29 ENCOUNTER — Emergency Department: Payer: MEDICAID

## 2024-05-29 ENCOUNTER — Other Ambulatory Visit: Payer: Self-pay

## 2024-05-29 DIAGNOSIS — Y908 Blood alcohol level of 240 mg/100 ml or more: Secondary | ICD-10-CM | POA: Diagnosis not present

## 2024-05-29 DIAGNOSIS — F10920 Alcohol use, unspecified with intoxication, uncomplicated: Secondary | ICD-10-CM

## 2024-05-29 DIAGNOSIS — S0993XA Unspecified injury of face, initial encounter: Secondary | ICD-10-CM | POA: Diagnosis present

## 2024-05-29 DIAGNOSIS — W010XXA Fall on same level from slipping, tripping and stumbling without subsequent striking against object, initial encounter: Secondary | ICD-10-CM | POA: Insufficient documentation

## 2024-05-29 DIAGNOSIS — S0282XA Fracture of other specified skull and facial bones, left side, initial encounter for closed fracture: Secondary | ICD-10-CM | POA: Insufficient documentation

## 2024-05-29 DIAGNOSIS — Y92481 Parking lot as the place of occurrence of the external cause: Secondary | ICD-10-CM | POA: Insufficient documentation

## 2024-05-29 DIAGNOSIS — F10129 Alcohol abuse with intoxication, unspecified: Secondary | ICD-10-CM | POA: Diagnosis not present

## 2024-05-29 DIAGNOSIS — S0292XA Unspecified fracture of facial bones, initial encounter for closed fracture: Secondary | ICD-10-CM

## 2024-05-29 LAB — COMPREHENSIVE METABOLIC PANEL WITH GFR
ALT: 15 U/L (ref 0–44)
AST: 30 U/L (ref 15–41)
Albumin: 5 g/dL (ref 3.5–5.0)
Alkaline Phosphatase: 64 U/L (ref 38–126)
Anion gap: 15 (ref 5–15)
BUN: 12 mg/dL (ref 6–20)
CO2: 25 mmol/L (ref 22–32)
Calcium: 9.3 mg/dL (ref 8.9–10.3)
Chloride: 101 mmol/L (ref 98–111)
Creatinine, Ser: 1.18 mg/dL (ref 0.61–1.24)
GFR, Estimated: >60 mL/min (ref >60)
Glucose, Bld: 86 mg/dL (ref 70–99)
Potassium: 3.5 mmol/L (ref 3.5–5.1)
Sodium: 141 mmol/L (ref 135–145)
Total Bilirubin: 0.6 mg/dL (ref 0.0–1.2)
Total Protein: 8.3 g/dL — ABNORMAL HIGH (ref 6.5–8.1)

## 2024-05-29 LAB — CBC WITH DIFFERENTIAL/PLATELET
Abs Immature Granulocytes: 0.03 K/uL (ref 0.00–0.07)
Basophils Absolute: 0.1 K/uL (ref 0.0–0.1)
Basophils Relative: 1 %
Eosinophils Absolute: 0.1 K/uL (ref 0.0–0.5)
Eosinophils Relative: 1 %
HCT: 41.2 % (ref 39.0–52.0)
Hemoglobin: 13.7 g/dL (ref 13.0–17.0)
Immature Granulocytes: 0 %
Lymphocytes Relative: 40 %
Lymphs Abs: 3.5 K/uL (ref 0.7–4.0)
MCH: 33.3 pg (ref 26.0–34.0)
MCHC: 33.3 g/dL (ref 30.0–36.0)
MCV: 100.2 fL — ABNORMAL HIGH (ref 80.0–100.0)
Monocytes Absolute: 0.7 K/uL (ref 0.1–1.0)
Monocytes Relative: 8 %
Neutro Abs: 4.3 K/uL (ref 1.7–7.7)
Neutrophils Relative %: 50 %
Platelets: 178 K/uL (ref 150–400)
RBC: 4.11 MIL/uL — ABNORMAL LOW (ref 4.22–5.81)
RDW: 15 % (ref 11.5–15.5)
WBC: 8.7 K/uL (ref 4.0–10.5)
nRBC: 0 % (ref 0.0–0.2)

## 2024-05-29 LAB — MAGNESIUM: Magnesium: 1.8 mg/dL (ref 1.7–2.4)

## 2024-05-29 LAB — ETHANOL: Alcohol, Ethyl (B): 393 mg/dL (ref <15)

## 2024-05-29 NOTE — ED Notes (Signed)
VOLUNTARY 

## 2024-05-29 NOTE — ED Notes (Signed)
 Pt back from imaging

## 2024-05-29 NOTE — ED Notes (Signed)
 Pt transported to xray via wheel chair

## 2024-05-29 NOTE — Telephone Encounter (Signed)
 Emergency Medicine Access Physician [facility]): [facility] Transfer Request Note  Requesting Provider: [facility]: [facility]  Requesting Service: Emergency Medicine  Reason for transfer: Fall with facial trauma  Overview of ED course at transferring hospital: Joshua Medina is a 38 y.o. male with a h/o AUD who presented after a fall while intoxicated and sustained L orbital blowout with involvement of floor and medial/lateral walls and comminuted L max sinus fx, and post zygomatic process. No entrapment. No proptosis. CT head and C spine neg. EtOH 393, but awake and answering questions, VSS.   Was the patient accepted to the [facility] ED in transfer: Yes. If no, why?: N/A  Accepting service/expected service information: Has an inpatient or consulting service been contacted by the [facility], [facility] or the referring provider?: No. If admitting/consult service contacted by [facility]: Time of discussion: N/A Name/service (e.g. Dr. Norleen Sharps, neprology) of inpatient team member/consultant: N/A Role of admit/consult service member: N/A Brief summary of call: N/A Anticipated Inpatient Bed Type Needed: To be determined  Outside Hospital Imaging: Was Imaging Done at the Referring Hospital:  Yes. If yes, what studies?: CT head, face, C spine [employer]:   y

## 2024-05-29 NOTE — ED Notes (Signed)
 [facility] ED called to let them know that the pt is leaving our facility with [facility].

## 2024-05-29 NOTE — ED Provider Notes (Signed)
 [facility] Provider Note    Event Date/Time   First MD Initiated Contact with Patient 05/29/24 1804     (approximate)   History   Chief Complaint Fall and Alcohol Intoxication   HPI  Joshua Medina is a 38 y.o. male with past medical history of alcohol abuse and alcohol withdrawal seizures who presents to the ED complaining of alcohol intoxication and fall.  Per EMS, patient witnessed to have a fall outside [place] by bystanders, subsequently struck his face on concrete.  It is unclear earlier whether he lost consciousness, was awake and alert with EMS.  He does admit to significant alcohol consumption today, is unsure of his last drink but states he drinks more than 1/5 of liquor daily.  He denies any drug use.  He complains of some pain to his left shoulder and left face, denies headache, neck pain, chest pain, or abdominal pain.     Physical Exam   Triage Vital Signs: ED Triage Vitals  Encounter Vitals Group     BP 05/29/24 1810 105/82     Girls Systolic BP Percentile --      Girls Diastolic BP Percentile --      Boys Systolic BP Percentile --      Boys Diastolic BP Percentile --      Pulse Rate 05/29/24 1810 74     Resp 05/29/24 1810 16     Temp 05/29/24 1810 97.9 F (36.6 C)     Temp Source 05/29/24 1810 Oral     SpO2 05/29/24 1810 100 %     Weight --      Height 05/29/24 1808 5' 10 (1.778 m)     Head Circumference --      Peak Flow --      Pain Score 05/29/24 1807 8     Pain Loc --      Pain Education --      Exclude from Growth Chart --     Most recent vital signs: Vitals:   05/29/24 1810 05/29/24 2216  BP: 105/82 106/74  Pulse: 74 78  Resp: 16 18  Temp: 97.9 F (36.6 C) 97.9 F (36.6 C)  SpO2: 100% 99%    Constitutional: Alert and oriented, intoxicated appearing. Eyes: Conjunctivae are normal.  Pupils equal, round, and reactive to light bilaterally. Head: Abrasion with edema and ecchymosis to the left cheek as well as the  left frontal scalp, bony tenderness noted without step-off. Nose: No congestion/rhinnorhea. Mouth/Throat: Mucous membranes are moist.  Neck: No midline cervical spine tenderness to palpation. Cardiovascular: Normal rate, regular rhythm. Grossly normal heart sounds.  2+ radial pulses bilaterally. Respiratory: Normal respiratory effort.  No retractions. Lungs CTAB.  No chest wall tenderness to palpation. Gastrointestinal: Soft and nontender. No distention. Musculoskeletal: No lower extremity tenderness nor edema.  Diffuse tenderness to palpation of the left shoulder with no obvious deformity.  No right upper extremity bony tenderness. Neurologic:  Normal speech and language. No gross focal neurologic deficits are appreciated.    ED Results / Procedures / Treatments   Labs (all labs ordered are listed, but only abnormal results are displayed) Labs Reviewed  CBC WITH DIFFERENTIAL/PLATELET - Abnormal; Notable for the following components:      Result Value   RBC 4.11 (*)    MCV 100.2 (*)    All other components within normal limits  COMPREHENSIVE METABOLIC PANEL WITH GFR - Abnormal; Notable for the following components:   Total Protein 8.3 (*)  All other components within normal limits  ETHANOL - Abnormal; Notable for the following components:   Alcohol, Ethyl (B) 393 (*)    All other components within normal limits  MAGNESIUM      EKG  ED ECG REPORT I, Carlin Palin, the attending physician, personally viewed and interpreted this ECG.   Date: 05/29/2024  EKG Time: 18:15  Rate: 73  Rhythm: normal sinus rhythm  Axis: RAD  Intervals:none  ST&T Change: None  RADIOLOGY CT head reviewed and interpreted by me with no hemorrhage or midline shift.  PROCEDURES:  Critical Care performed: Yes, see critical care procedure note(s)  .Critical Care  Performed by: Palin Carlin, MD Authorized by: Palin Carlin, MD   Critical care provider statement:    Critical care time  (minutes):  30   Critical care time was exclusive of:  Separately billable procedures and treating other patients and teaching time   Critical care was necessary to treat or prevent imminent or life-threatening deterioration of the following conditions:  Trauma   Critical care was time spent personally by me on the following activities:  Development of treatment plan with patient or surrogate, discussions with consultants, evaluation of patient's response to treatment, examination of patient, ordering and review of laboratory studies, ordering and review of radiographic studies, ordering and performing treatments and interventions, pulse oximetry, re-evaluation of patient's condition and review of old charts   I assumed direction of critical care for this patient from another provider in my specialty: no     Care discussed with: accepting provider at another facility      MEDICATIONS ORDERED IN ED: Medications - No data to display   IMPRESSION / MDM / ASSESSMENT AND PLAN / ED COURSE  I reviewed the triage vital signs and the nursing notes.                              38 y.o. male with past medical history of alcohol abuse and alcohol withdrawal seizures who presents to the ED following trip and fall in the parking lot, striking his head and left shoulder.  Patient's presentation is most consistent with acute presentation with potential threat to life or bodily function.  Differential diagnosis includes, but is not limited to, intracranial injury, facial fracture, cervical spine injury, shoulder contusion, fracture, dislocation.  Patient intoxicated appearing but in no acute distress, vital signs are unremarkable.  He is awake and alert with a nonfocal neurologic exam, does have significant swelling to his left face and we will check CT head, maxillofacial, and cervical spine.  We will also check x-ray of the left shoulder, no evidence of traumatic injury to his trunk or lower extremities.   EKG without evidence of arrhythmia or ischemia, lab results are pending.  CT head and cervical spine are negative for acute process, however CT maxillofacial with fractures of multiple orbital walls as well as multiple walls of the maxillary sinus and the zygomatic process.  Labs with elevated ethanol level but no significant anemia, leukocytosis, electrolyte abnormality, or AKI.  LFTs and magnesium  level are unremarkable.  Case discussed with Dr. Milissa of ENT, who recommends transfer to trauma center for further management.  Case discussed with ED provider at [facility], who accepted patient for ED to ED transfer.      FINAL CLINICAL IMPRESSION(S) / ED DIAGNOSES   Final diagnoses:  Alcoholic intoxication without complication  Multiple closed fractures of facial bone, initial encounter (HCC)  Rx / DC Orders   ED Discharge Orders     None        Note:  This document was prepared using Dragon voice recognition software and may include unintentional dictation errors.   Willo Dunnings, MD 05/30/24 STAN[place]

## 2024-05-29 NOTE — ED Notes (Signed)
EMTALA reviewed by this RN and transfer consent signed. 

## 2024-05-29 NOTE — ED Provider Notes (Signed)
 [facility] Emergency Department Provider Note  ED Clinical Impression   Final diagnoses:  Multiple closed facial bone fractures with routine healing, subsequent encounter (Primary)    HPI, ED Course, Assessment and Plan   Initial Clinical Impression:  May 29, 2024 11:32 PM  Joshua Medina is a 38 y.o. male with past medical history of HTN, alcohol use disorder, alcohol withdrawal seizure, hypothyroidism, and anemia presenting as a transfer from [facility] ED with facial pain. Per chart review, the patient sustained a ground level fall while intoxicated earlier today, during which he struck his face on concrete. CT maxillofacial done at OSH showed acute left zygomaticomaxillary complex fracture and large left facial hematoma. CT head and XR L shoulder resulted negative. On evaluation, the patient reports that his left shoulder and left facial pain is controlled but not fully resolved.  BP 112/73   Pulse 67   Temp 36.6 C (97.9 F) (Oral)   Resp 18   SpO2 100%   On exam, patient is nontoxic appearing, and in no acute distress. Vitals are unremarkable; patient is hemodynamically stable, afebrile. Physical exam notable for dried blood in nares, but no nasal septal hematoma. EOMI with no pain. No frontal tenderness. No mandibular tenderness. No intraorbital trauma. Abdomen is soft and non tender. Pelvis is stable. Pulses in all 4 extremities. Tenderness noted in left shoulder. Full ROM. Bilateral lung sounds. No spinal tenderness, step offs, or deformities.  Medical Decision Making  38 year old male with past medical history as above presents as a transfer with orbital blowout fracture including the orbital floor with 5 mm of displacement.  On exam he is hemodynamically stable.  Pulse is stable.  Extraocular movements are intact he has tenderness to palpation over his left maxillary sinus with hematoma.  Pupils are equal round and reactive.  IOP is 15 on the left.  Is well-controlled at this time.   Will discuss with OMFS who is on facial trauma call as well as ophthalmology at their recommendation.  Further ED updates and updates to plan as per ED Course below:  ED Course as of 05/30/24 0103  Fri May 30, 2024  0034 Ophtho paged  0101 Discussed with OMFS and ophthalmology who will evaluate the patient.  Unfortunately we are unable to obtain power shared images from outside hospital.  Will repeat CT max face.  0101  x-ray of left shoulder from outside hospital without fracture or dislocation.  Unfortunately am not able to see the images myself.  0102 Patient signed out to oncoming ED resident in stable condition.  Awaiting repeat CT as well as evaluation from both ophthalmology and OMFS who is on facial trauma call     External Records Reviewed: I have reviewed recent and relevant previous record, including: Outpatient notes - 04/30/24 [facility] note for PMH  Discussion of Management With Other Providers or Support Staff: I discussed the management of this patient with the: Discussed with OMFS and ophthalmology who evaluated the patient   Social Determinants that significantly affected care: None      Social Drivers of Health with Concerns   Food Insecurity: Food Insecurity Present (09/19/2023)   Received from [facility]   Hunger Vital Sign   . Within the past 12 months, you worried that your food would run out before you got the money to buy more.: Sometimes true   . Within the past 12 months, the food you bought just didn't last and you didn't have money to get more.:  Sometimes true  Tobacco Use: High Risk (05/29/2024)   Received from [facility]   Patient History   . Smoking Tobacco Use: Every Day   . Smokeless Tobacco Use: Never   . Passive Exposure: Current  Transportation Needs: No Transportation Needs (11/27/2023)   Received from [facility] - Transportation   . Lack of Transportation (Medical): No   . Lack of Transportation  (Non-Medical): No  Recent Concern: Transportation Needs - Unmet Transportation Needs (09/19/2023)   Received from [facility] - Transportation   . Lack of Transportation (Medical): Yes   . Lack of Transportation (Non-Medical): Yes  Alcohol Use: Not on file  Housing: Not on file  Physical Activity: Not on file  Stress: Not on file  Substance Use: Not on file (05/29/2024)  Social Connections: Not on file  Financial Resource Strain: Not on file  Health Literacy: Not on file  Internet Connectivity: Not on file   _____________________________________________________________________  The case was discussed with the attending physician who is in agreement with the above assessment and plan  Past History   PAST MEDICAL HISTORY/PAST SURGICAL HISTORY:  Past Medical History[1]  Past Surgical History[2]  MEDICATIONS:  No current facility-administered medications for this encounter.  Current Outpatient Medications:  .  levothyroxine  (SYNTHROID ) 100 MCG tablet, Take 1 tablet (100 mcg total) by mouth daily., Disp: , Rfl:   ALLERGIES:  Prednisone  SOCIAL HISTORY:  Social History   Tobacco Use  . Smoking status: Not on file  . Smokeless tobacco: Not on file  Substance Use Topics  . Alcohol use: Not on file    FAMILY HISTORY: Family History[3]   Review of Systems   A review of systems was performed and relevant portions were as noted above in HPI   Physical Exam   VITAL SIGNS:   BP 112/73   Pulse 67   Temp 36.6 C (97.9 F) (Oral)   Resp 18   SpO2 100%   Constitutional:  Alert and oriented.  Head:  No frontal tenderness. No mandibular tenderness.  Eyes:  Conjunctivae are normal, PERRL. EOMI with no pain. No intraorbital trauma. ENT:  Mucous membranes moist, External ears normal, no notable stridor. Dried blood in nares, but no nasal septal hematoma. Cardiovascular:  Rate as vitals above. Appears warm and well perfused Respiratory:  Normal respiratory  effort. Breath sounds are normal. Gastrointestinal:  Soft, non-distended, and nontender. Pelvis is stable. Genitourinary:  Deferred Musculoskeletal:   Tenderness noted in left shoulder. Full ROM. No spinal tenderness, step offs, or deformities. Neurologic:  No gross focal neurologic deficits beyond baseline are appreciated. Skin:  Skin is warm, dry and intact.    Radiology   CT Maxillofacial Wo Contrast    (Results Pending)    Labs   Labs Reviewed - No data to display   Pertinent labs & imaging results that were available during my care of the patient were reviewed by me and considered in my medical decision making (see chart for details).  Please note- This chart has been created using [employer]. Chart creation errors have been sought, but may not always be located and such creation errors, especially pronoun confusion, do NOT reflect on the standard of medical care.  Documentation assistance was provided by Learta Loots, Scribe, on May 29, 2024 at 11:32 PM for Dorn Stain, MD.  May 30, 2024 1:02 AM. Documentation assistance provided by the scribe. I was present during the time the encounter was recorded. The  information recorded by the scribe was done at my direction and has been reviewed and validated by me.         [1] Past Medical History: Diagnosis Date  . Hypothyroid   . Pre-diabetes   [2] No past surgical history on file. [3] No family history on file.  Vikki Carrier, MD Resident 05/30/24 [phone]

## 2024-05-29 NOTE — ED Triage Notes (Addendum)
 Pt to ed from across the street at [facility] via [facility] for a fall with ETOH on board. Pt is caoxc, in no acute distress. Pt has breen drinking vodkal all day. Ground level fall. Pt struck his face on the concrete. No visual disturbances. Left bruising noted to face.  Left shoulder pain with no deformity. Pt is very unsteady.  113/80 100% RA  74 HR  82 BGL   Unknown LOC. Fall bundle in place. Pt is homeless.

## 2024-05-29 NOTE — ED Notes (Signed)
 [facility] Emergency Department Attestation Note     ED Clinical Impression    Final diagnoses:  Multiple closed facial bone fractures with routine healing, subsequent encounter (Primary)      ED Attending Physician Teaching Attestation    I supervised care provided by the resident. We have discussed the case, I have reviewed the note and I agree with the plan of treatment except as documented in my note.  I have personally performed a face-to-face diagnostic evaluation on this patient.       ED Attending Note    ED Triage Vitals  Enc Vitals Group     BP 05/29/24 2330 112/73     Pulse 05/29/24 2319 69     SpO2 Pulse 05/29/24 2319 69     Resp 05/29/24 2330 18     Temp 05/29/24 2330 36.6 C (97.9 F)     Temp Source 05/29/24 2330 Oral     SpO2 05/29/24 2319 96 %    Joshua Medina is a 38 y.o. male with a past medical history of 04/30/24 [facility] note for PMH presenting as transfer from [facility] ED for acute left zygomaticomaxillary complex fracture s/p fall while intoxicated earlier today. He was noted to strike his face on concrete. On evaluation, the patient reports that his left shoulder and left facial pain is controlled but not fully resolved.   On exam, patient is nontoxic appearing, and in no acute distress. Vitals are unremarkable; patient is hemodynamically stable, afebrile. Physical exam notable for dried blood in nares, but no nasal septal hematoma. EOMI with no pain. No frontal tenderness. No mandibular tenderness. No intraorbital trauma. Abdomen is soft and non tender. Pelvis is stable. Pulses in all 4 extremities. Tenderness noted in left shoulder. Full ROM. Bilateral lung sounds. No spinal tenderness, step offs, or deformities.   A/P: Patient arrives with exam as listed above.  He is awake and alert however still intoxicated.  No other new injuries noted aside from facial injury.  Plan to consult facial trauma for further evaluation.  Likely will  require ophthalmology evaluation.  Will treat patient with Oxycodone.  Ending recommendations and evaluation.  ________________________________________________________________  Portions of this record have been created using Scientist, clinical (histocompatibility and immunogenetics). Dictation errors have been sought, but may not have been identified and corrected.  Documentation assistance was provided by Learta Loots, Scribe on May 29, 2024 at 11:45 PM for Reggie Elbe, MD.  Documentation assistance was provided by the scribe in my presence.  The documentation recorded by the scribe has been reviewed by me and accurately reflects the services I personally performed.

## 2024-05-30 NOTE — Consults (Signed)
 ------------------------------------------------------------------------------- Attestation signed by Sherrod Flight, MD at 06/01/24 [phone] I discussed the findings, assessment, and plan with the resident and agree with the findings and plan as documented in the resident's note, but did not see the patient. I was immediately available via phone/pager or present on site to review the details of the case.   Flight Sherrod, MD Cornea/external disease fellow   -------------------------------------------------------------------------------  Ophthalmology Consult Note   Requesting Attending Physician: Chiquita MARLA Bare Service Requesting Consult: Emergency Medicine  Consult Attending Physician: Dr. Sherrod  Permission to dilate eyes was obtained from Dorn Stain MD. Patient's eyes were dilated by Ophthalmology at 2:04 am.   Assessment: 38 y.o. male with history of alcohol use disorder presenting for facial trauma after fall on face.   Ophthalmology was consulted for assistance with evaluation and management.  # Trauma to face # L facial hematoma - mechanism of injury: ground level fall while intoxicated and struck face on concrete - OSH CT maxillofacial showing acute left zygomaticomaxillary complex fracture and large left facial hematoma  - patient denies diplopia and pain with eye movements - L lower periorbital hematoma with edema and ecchymoses - VA 20/20 OU - IOP wnl  - no rAPD, full EOMs - SLE wnl, no corneal staining - fundus wnl   Photos 05/30/24:   # L orbital floor fracture - Updated CT maxillofacial obtained at [facility] 05/30/24 @ 0600 - CT impression:  -Left orbital floor fracture with involvement of the infraorbital nerve canal and mild extension into the inferior aspect of the lamina papyracea/medial wall. Mildly displaced fracture of the left lateral orbital wall. Left zygoma segmental nondisplaced fractures (4:94 and 91). Comminuted fractures through the anterior,  lateral, and medial maxillary sinus walls. Fluid, likely blood, fills the left maxillary sinus. Trace mucosal thickening of the frontal, ethmoid, and right maxillary sinus. Hematoma and soft tissue swelling anterior to the left globe. No evidence of direct globe injury. Gas tracks along the inferior globe and inferior extraconal fat.  - Negative for signs of globe rupture - Low concern for new retrobulbar hematoma  Recommendations - There is no indication for acute intervention based on my exam given lack of significant entrapment, oculocardiac reflex or symptomatic n/v. - please ice left periorbital region to decrease swelling - thank you for this consult. We will sign off. Please re consult ophthalmology should any visual or ocular concerns arise.   Darice Ruth, MD PGY-3, Department of Ophthalmology [facility] [address], [place], [place] [address]   __________________________________________________________________  Reason for Consult: Fall on face  History of Present Illness: Joshua Medina is a 38 y.o. male whom we are asked to see in consultation for the above.  Hospital Problem List: Problem List[1]  History:  Past Ocular History: Negative  Past Medical History: Past Medical History[2]  Past Surgical History: Past Surgical History[3]  Family History: Negative family ocular history  Social History: Social History [4] Unable to assess tobacco use  Medications: Scheduled Meds:Scheduled Medications[5] Continuous Infusions:Infusions Meds[6] PRN Meds:.PRN Medications[7]  Allergies: Allergies[8]  Review of Systems: 12 systems reviewed and negative unless otherwise stated in HPI or recent HPI  Physical Exam: Temp Readings from Last 2 Encounters:  05/29/24 36.6 C (97.9 F) (Oral)   BP Readings from Last 2 Encounters:  05/29/24 112/73   Pulse Readings from Last 2 Encounters:  05/29/24 67   Resp Readings from Last 2 Encounters:   05/29/24 18   SpO2 Readings from Last 1 Encounters:  05/29/24 100%    General:  No acute distress  Neuro/Psych: Alert and oriented to person, place, and time  Ophthalmic Exam: Base Eye Exam     Visual Acuity Latricia Near)       Right Left   Near [place] 20/20 20/20         Tonometry (Icare, 2:07 AM)       Right Left   Pressure 11 14         Pupils       Dark Light Shape React APD   Right 4 2 Round Brisk None   Left 4 2 Round Brisk None         Visual Fields       Left Right    Full Full         Extraocular Movement       Right Left    Full Full         Dilation     Both eyes: 1% Tropicamide, 2.5% Phenylephrine @ 2:08 AM           Slit Lamp and Fundus Exam     External Exam       Right Left   External Normal periorbital edema and ecchymoses         Slit Lamp Exam       Right Left   Lids/Lashes Normal periorbital edema and ecchymoses   Conjunctiva/Sclera White and quiet White and quiet   Cornea -NaFl uptake -NaFl uptake   Anterior Chamber Deep and quiet Deep and quiet   Iris Round and reactive Round and reactive   Lens Clear Clear   Anterior Vitreous Normal Normal            Diagnostic Testing: All pertinent labs and imaging results reviewed  __________________________________________________________________        [1] There is no problem list on file for this patient. [2] Past Medical History: Diagnosis Date  . Hypothyroid   . Pre-diabetes   [3] No past surgical history on file. [4]   Social Drivers of Health   Food Insecurity: Food Insecurity Present (09/19/2023)   Received from [facility]   Hunger Vital Sign   . Within the past 12 months, you worried that your food would run out before you got the money to buy more.: Sometimes true   . Within the past 12 months, the food you bought just didn't last and you didn't have money to get more.: Sometimes true  Transportation Needs: No Transportation  Needs (11/27/2023)   Received from [facility] - Transportation   . Lack of Transportation (Medical): No   . Lack of Transportation (Non-Medical): No  Recent Concern: Transportation Needs - Unmet Transportation Needs (09/19/2023)   Received from [facility] - Transportation   . Lack of Transportation (Medical): Yes   . Lack of Transportation (Non-Medical): Yes  [5] [6] [7] [8] Allergies Allergen Reactions  . Prednisone Hives and Itching

## 2024-05-30 NOTE — ED Provider Notes (Signed)
 I received signout from previous ED physician.   ED I-PASS Handoff Patient Summary: Joshua Medina is a 38 y.o. male HTN, alcohol use disorder, alcohol withdrawal seizure, hypothyroidism, and anemia presenting as a transfer from [facility] ED with facial pain. Per chart review, the patient sustained a ground level fall while intoxicated earlier today, during which he struck his face on concrete. CT maxillofacial done at OSH showed acute left zygomaticomaxillary complex fracture and large left facial hematoma.  Action List: Patient is pending CT max face with OMFS/ophthalmology consult. Dispo is pending the results of these tests.  Situation Awareness (Contingency Planning): Anticipate dispo per results of OMFS and ophthalmology recommendations Synthesis by Receiver  ED Course as of 05/30/24 0616  Fri May 30, 2024  0217 Ophthalmology has no other recommendations at this time.  [id number] Patient signed out to oncoming team pending CT max face and OMFS evaluation    Discussion of Management with other Physicians, QHP, or Appropriate Source:  External Records Reviewed: I have independently reviewed patient's external medical records through Care Everywhere:  Escalation of Care, Consideration of Admission/Observation/Transfer:  Social determinants that significantly affected care:  Prescription drug(s) considered but not prescribed:  Diagnostic tests considered but not performed:   Final diagnoses:  Multiple closed facial bone fractures with routine healing, subsequent encounter (Primary)    Vitals:   05/29/24 2319 05/29/24 2330  BP:  112/73  Pulse: 69 67  Resp:  18  Temp:  36.6 C (97.9 F)  TempSrc:  Oral  SpO2: 96% 100%    CT Maxillofacial Wo Contrast    (Results Pending)    No results found for: WBC, HGB, HCT, PLT  No results found for: NA, K, CL, CO2, BUN, CREATININE, GLU, CALCIUM , MG, PHOS  No results found for: BILITOT, BILIDIR, PROT, ALBUMIN , ALT,  AST, ALKPHOS, GGT  No results found for: LABPROT, INR, APTT

## 2024-05-31 NOTE — Care Plan (Signed)
  Care Management Final Transition Planning Assessment      Patient's Post Acute Contact Information: [phone]  Additional transition plan information: ED DC  Has a PCP appointment been made?: N/A   Has a specialist appointment been made?: N/A   Post Acute Facility needed at discharge?: No        Home Care/ Home Medical Equipment needed at discharge?: No           Outpatient/Community Referrals needed for discharge?: No       Transportation Anticipated: taxi    Currently receiving outpatient dialysis?: No       Discharge Disposition: Home w/ Self Care     IMM Delivery Follow Up Important Message (IM) letter given to patient and/or family?: N/A  Quality data for continuing care services shared with patient and/or representative?: N/A Patient and/or family were provided with choice of facilities / services that are available and appropriate to meet post hospital care needs?: N/A    Discharge Packet Contents:  (RN to provide AVS)  Final Assessment Complete: Yes                     Readmission Risk Score:  Predictive Model Details  No score data available for [facility] Risk of Unplanned Readmission

## 2024-05-31 NOTE — Nursing Note (Signed)
 Care Management:  CM responded to ED team member request for taxi voucher for dc transportation.  Pt ready for dc now. CM confirmed address, ability to ambulate  without assist and access to house with with RN.   Taxi voucher entered to [employer], Occupational hygienist via secure chat ad ED track board.  No additional CCM needs.  Adela MYRTIS Pon, MSW  May 30, 2024  9:20 AM

## 2024-07-23 ENCOUNTER — Ambulatory Visit: Payer: MEDICAID

## 2024-07-24 NOTE — Congregational Nurse Program (Signed)
  Dept: [phone]   Congregational Nurse Program Note  Date of Encounter: 07/24/2024 Client to [facility], nurse led clinic, for support. He had a virtual appointment [facility], but did not attend. He was agreeable to RN scheduling another appointment on 12/17 as that is the next available time. Appointment set for 10?30 am. Client given written information. He reports he does have enough of his levothyroxine  and is taking it. MARLA Marina BSN, RN Past Medical History: Past Medical History:  Diagnosis Date   Alcoholic (HCC)    Diabetes mellitus without complication (HCC)    Graves disease    Thyroid  disease     Encounter Details:  Community Questionnaire - 07/24/24 1115       Questionnaire   Ask client: Do you give verbal consent for me to treat you today? Yes    Student Assistance N/A    Location Patient Served  [facility]    Encounter Setting CN site    Population Status Unhoused   working with a program through the [employer] on housing   [employer]   Client has [employer] [facility] for primary and mental health   Insurance/Financial Assistance Referral N/A    Medication N/A   labs to be done 8/13, client expects to be prescribed synthroid    Medical Provider Yes   [facility]   Screening Referrals Made N/A    Medical Referrals Made [facility] Virtual Visit    Medical Appointment Completed N/A    CNP Interventions Advocate/Support;Case Management   assisted with checking on Rx   Screenings CN Performed N/A    ED Visit Averted N/A    Life-Saving Intervention Made N/A

## 2024-08-06 ENCOUNTER — Ambulatory Visit: Payer: MEDICAID

## 2024-08-20 NOTE — Progress Notes (Signed)
 Update made to 5in5

## 2024-08-26 NOTE — Progress Notes (Signed)
 Pt was seen by [facility] on 04/30/24 for a thyroid  check.   Chart review indicates that pt has insurance through [facility] and has a PCP listed asTejan-Sie, Ahmed MD.  Futher chart review that pt has not seen his listed PCP since 2024. Pt is documented as being unhoused and has SDOH housing and food needs. Pt has been working with Congregational RN Darice Marina at [facility] center. RN made pt an appt at the [facility] for 08/06/2024.   CHW called pt on 08/26/24 at 1017 to follow up on housing and food need, pt number in chart no longer valid.   CHW sent inbasket on 08/26/24 to Congregation RN Darice Marina extending my support with this pt.   As pt is confirmed to be unhoused letter will not be sent to pt.  Per health equity protocol future follow will be scheduled.
